# Patient Record
Sex: Male | Born: 1975 | Marital: Married | Smoking: Never smoker
Health system: Southern US, Community
[De-identification: ages and names within clinical notes are randomized; demographics above are authoritative.]

## PROBLEM LIST (undated history)

## (undated) DIAGNOSIS — G4730 Sleep apnea, unspecified: Secondary | ICD-10-CM

## (undated) DIAGNOSIS — J302 Other seasonal allergic rhinitis: Secondary | ICD-10-CM

## (undated) DIAGNOSIS — J189 Pneumonia, unspecified organism: Secondary | ICD-10-CM

## (undated) DIAGNOSIS — M51369 Other intervertebral disc degeneration, lumbar region without mention of lumbar back pain or lower extremity pain: Secondary | ICD-10-CM

## (undated) DIAGNOSIS — R42 Dizziness and giddiness: Secondary | ICD-10-CM

## (undated) DIAGNOSIS — L505 Cholinergic urticaria: Secondary | ICD-10-CM

## (undated) DIAGNOSIS — Z8679 Personal history of other diseases of the circulatory system: Secondary | ICD-10-CM

## (undated) DIAGNOSIS — T7840XA Allergy, unspecified, initial encounter: Secondary | ICD-10-CM

## (undated) DIAGNOSIS — M5136 Other intervertebral disc degeneration, lumbar region: Secondary | ICD-10-CM

## (undated) DIAGNOSIS — F32A Depression, unspecified: Secondary | ICD-10-CM

## (undated) DIAGNOSIS — Z9889 Other specified postprocedural states: Secondary | ICD-10-CM

## (undated) DIAGNOSIS — K219 Gastro-esophageal reflux disease without esophagitis: Secondary | ICD-10-CM

## (undated) DIAGNOSIS — J45909 Unspecified asthma, uncomplicated: Secondary | ICD-10-CM

## (undated) DIAGNOSIS — R040 Epistaxis: Secondary | ICD-10-CM

## (undated) DIAGNOSIS — T8859XA Other complications of anesthesia, initial encounter: Secondary | ICD-10-CM

## (undated) DIAGNOSIS — R112 Nausea with vomiting, unspecified: Secondary | ICD-10-CM

## (undated) DIAGNOSIS — I10 Essential (primary) hypertension: Secondary | ICD-10-CM

## (undated) DIAGNOSIS — M5126 Other intervertebral disc displacement, lumbar region: Secondary | ICD-10-CM

## (undated) DIAGNOSIS — Z8739 Personal history of other diseases of the musculoskeletal system and connective tissue: Secondary | ICD-10-CM

## (undated) HISTORY — PX: WISDOM TOOTH EXTRACTION: SHX21

## (undated) HISTORY — PX: ANTERIOR CRUCIATE LIGAMENT REPAIR: SHX115

## (undated) HISTORY — PX: COLON SURGERY: SHX602

## (undated) HISTORY — PX: SHOULDER SURGERY: SHX246

## (undated) HISTORY — DX: Cholinergic urticaria: L50.5

## (undated) HISTORY — DX: Allergy, unspecified, initial encounter: T78.40XA

## (undated) HISTORY — DX: Depression, unspecified: F32.A

## (undated) HISTORY — PX: MEDIAL COLLATERAL LIGAMENT AND LATERAL COLLATERAL LIGAMENT REPAIR, KNEE: SHX2017

## (undated) HISTORY — DX: Other intervertebral disc degeneration, lumbar region: M51.36

## (undated) HISTORY — DX: Other intervertebral disc displacement, lumbar region: M51.26

## (undated) HISTORY — DX: Other intervertebral disc degeneration, lumbar region without mention of lumbar back pain or lower extremity pain: M51.369

## (undated) HISTORY — DX: Personal history of other diseases of the musculoskeletal system and connective tissue: Z87.39

## (undated) HISTORY — DX: Personal history of other diseases of the circulatory system: Z86.79

## (undated) HISTORY — DX: Essential (primary) hypertension: I10

## (undated) HISTORY — DX: Epistaxis: R04.0

## (undated) HISTORY — DX: Pneumonia, unspecified organism: J18.9

---

## 1998-05-18 DIAGNOSIS — Z8739 Personal history of other diseases of the musculoskeletal system and connective tissue: Secondary | ICD-10-CM

## 1998-05-18 HISTORY — DX: Personal history of other diseases of the musculoskeletal system and connective tissue: Z87.39

## 2011-06-03 ENCOUNTER — Ambulatory Visit: Payer: Self-pay

## 2014-03-08 ENCOUNTER — Other Ambulatory Visit: Payer: Self-pay | Admitting: *Deleted

## 2014-03-08 ENCOUNTER — Encounter (INDEPENDENT_AMBULATORY_CARE_PROVIDER_SITE_OTHER): Payer: BC Managed Care – PPO

## 2014-03-08 ENCOUNTER — Encounter: Payer: Self-pay | Admitting: *Deleted

## 2014-03-08 DIAGNOSIS — R002 Palpitations: Secondary | ICD-10-CM

## 2014-03-08 NOTE — Progress Notes (Signed)
Patient ID: John Huang, male   DOB: 12/13/1975, 38 y.o.   MRN: [id number] [employer] 24 hour holter monitor applied to patient.

## 2014-07-16 ENCOUNTER — Ambulatory Visit
Admission: RE | Admit: 2014-07-16 | Discharge: 2014-07-16 | Disposition: A | Discharge status: Home / Self Care | Payer: BLUE CROSS/BLUE SHIELD | Source: Ambulatory Visit | Attending: Internal Medicine | Admitting: Internal Medicine

## 2014-07-16 ENCOUNTER — Other Ambulatory Visit: Payer: Self-pay | Admitting: Internal Medicine

## 2014-07-16 DIAGNOSIS — S6392XA Sprain of unspecified part of left wrist and hand, initial encounter: Secondary | ICD-10-CM

## 2014-07-16 IMAGING — CR DG HAND COMPLETE 3+V*L*
3 series · 3 of 3 positions shown · non-contrast
Comparison: None.

CLINICAL DATA: Ski jumping injury with left hand pain an swelling,
initial encounter

EXAM:
LEFT HAND - COMPLETE 3+ VIEW

[x hand pa left]
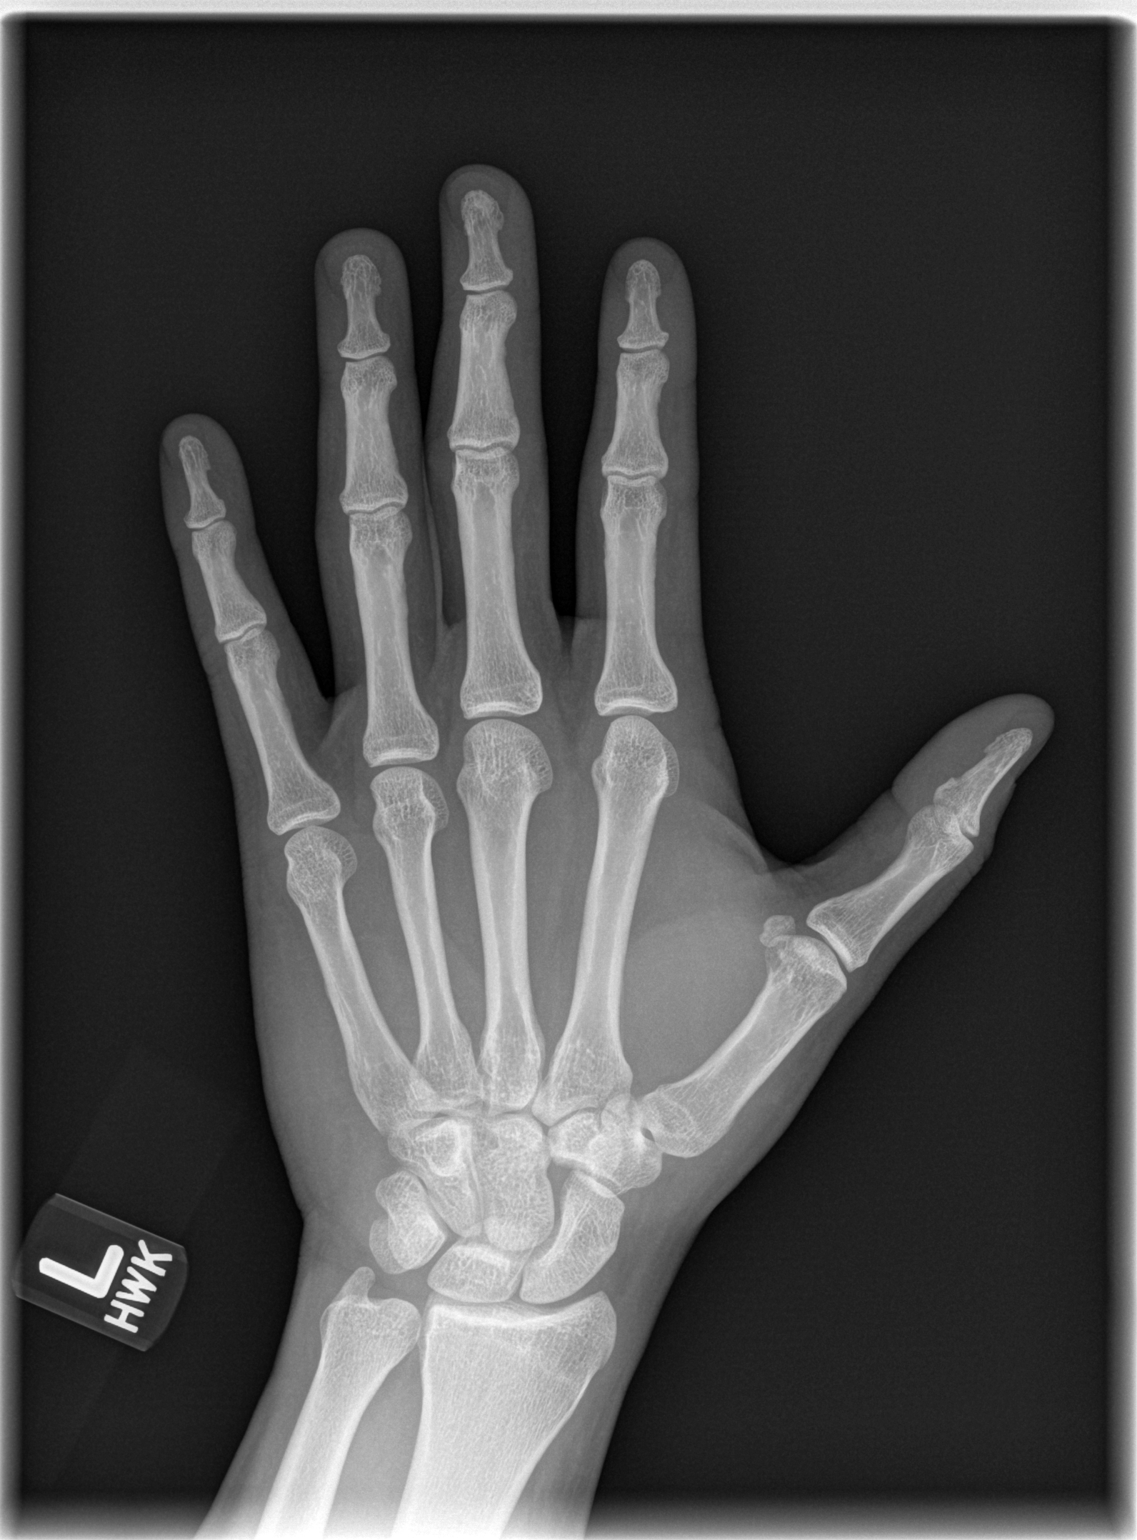

[x hand oblique left]
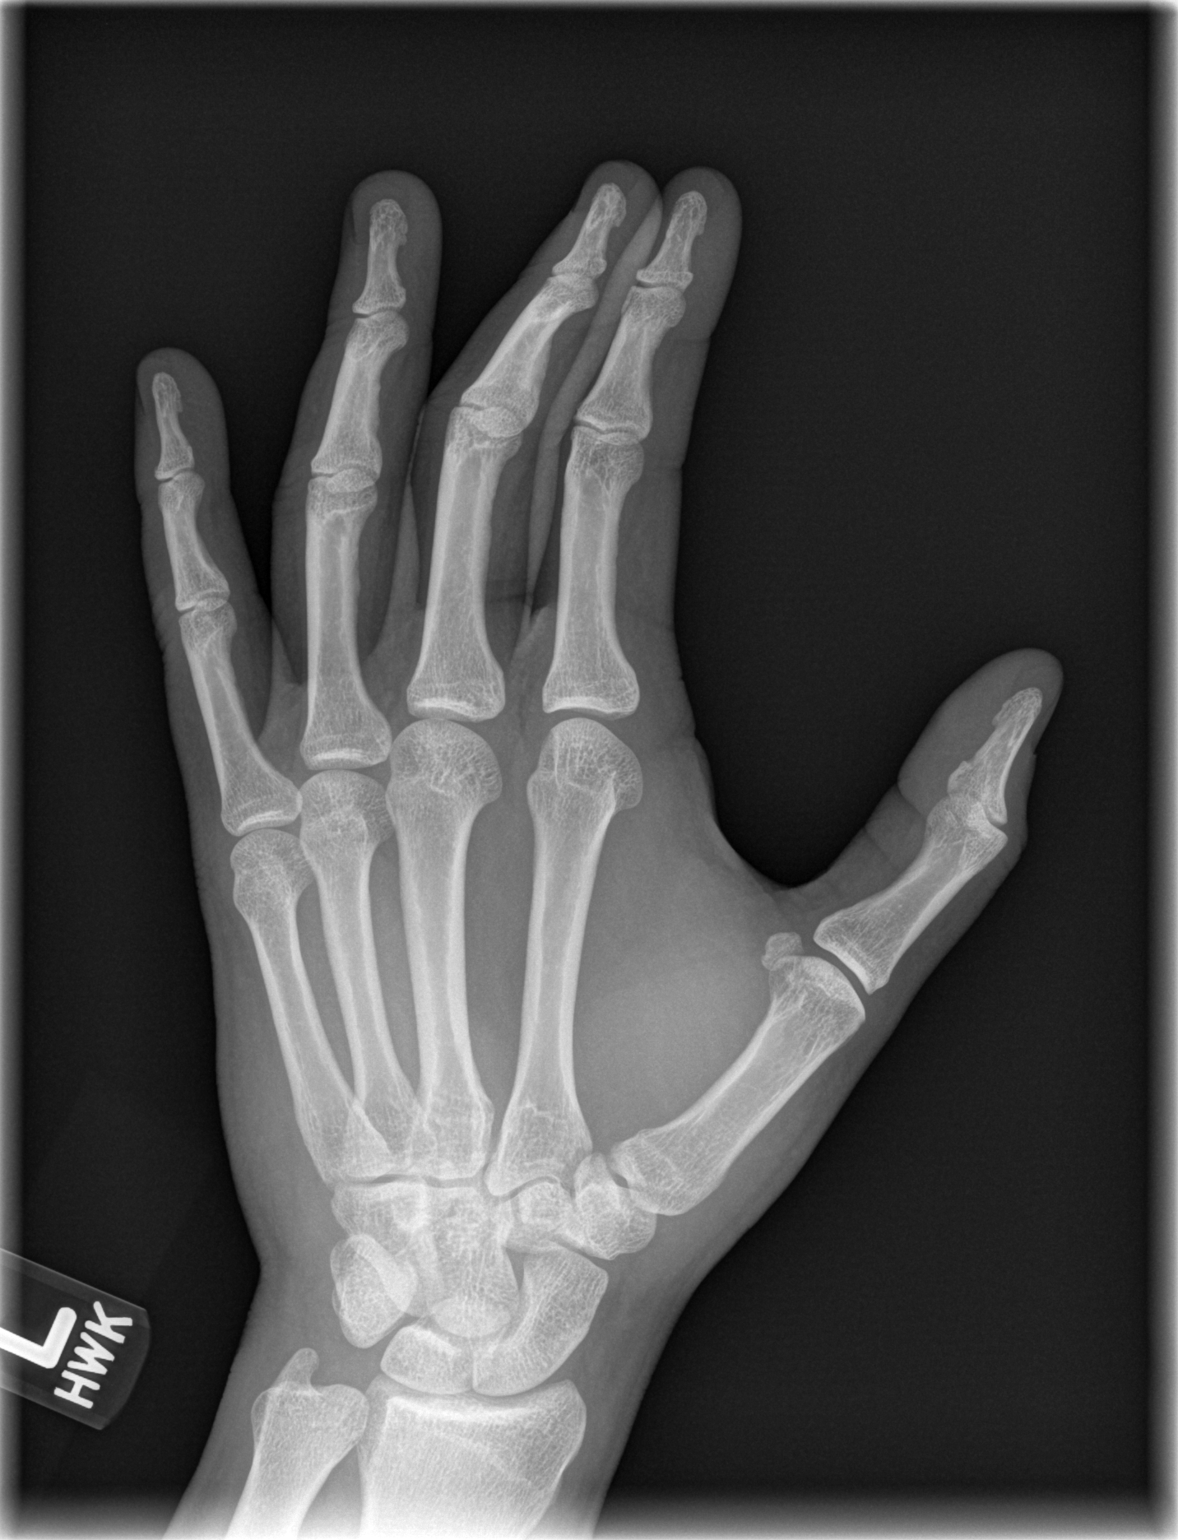

[x hand lat left]
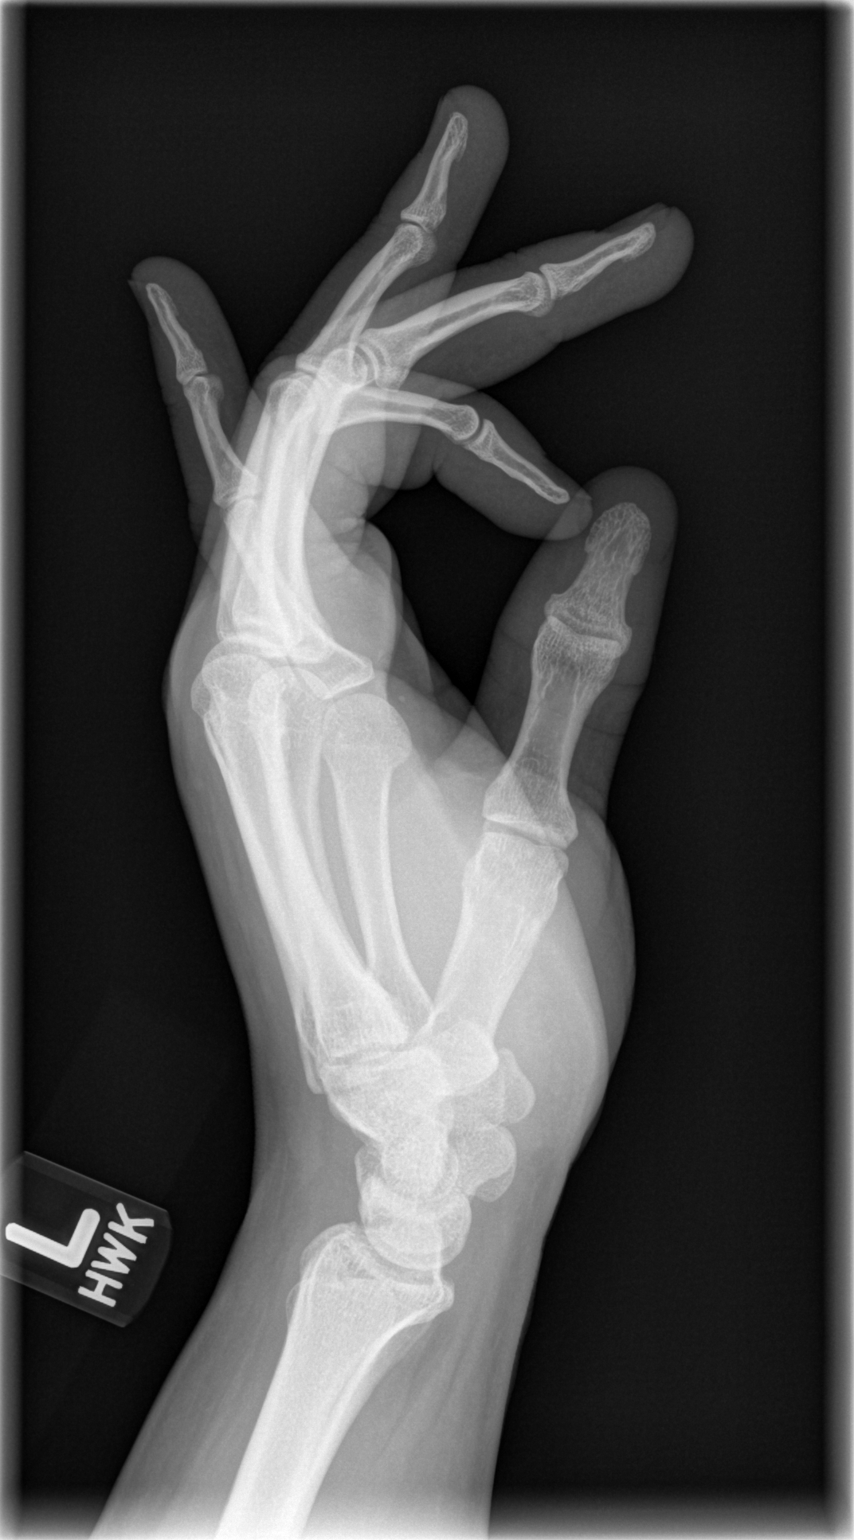

[3 of 3 positions shown; findings below may reference images not displayed]

FINDINGS: There is no evidence of fracture or dislocation. There is no
evidence of arthropathy or other focal bone abnormality. Soft
tissues are unremarkable.
IMPRESSION: No acute abnormality noted.

## 2016-02-18 DIAGNOSIS — J189 Pneumonia, unspecified organism: Secondary | ICD-10-CM | POA: Insufficient documentation

## 2016-02-18 DIAGNOSIS — K219 Gastro-esophageal reflux disease without esophagitis: Secondary | ICD-10-CM | POA: Insufficient documentation

## 2016-02-18 HISTORY — DX: Pneumonia, unspecified organism: J18.9

## 2016-05-22 LAB — HIV ANTIBODY (ROUTINE TESTING W REFLEX): HIV 1&2 Ab, 4th Generation: NEGATIVE

## 2017-05-12 DIAGNOSIS — Z7951 Long term (current) use of inhaled steroids: Secondary | ICD-10-CM | POA: Diagnosis not present

## 2017-05-12 DIAGNOSIS — I10 Essential (primary) hypertension: Secondary | ICD-10-CM | POA: Diagnosis not present

## 2017-05-12 DIAGNOSIS — M5126 Other intervertebral disc displacement, lumbar region: Secondary | ICD-10-CM | POA: Diagnosis not present

## 2017-05-12 DIAGNOSIS — R531 Weakness: Secondary | ICD-10-CM | POA: Diagnosis not present

## 2017-05-12 DIAGNOSIS — Z79899 Other long term (current) drug therapy: Secondary | ICD-10-CM | POA: Diagnosis not present

## 2017-05-12 DIAGNOSIS — M545 Low back pain: Secondary | ICD-10-CM | POA: Diagnosis not present

## 2017-05-12 DIAGNOSIS — M5416 Radiculopathy, lumbar region: Secondary | ICD-10-CM | POA: Diagnosis not present

## 2017-05-12 DIAGNOSIS — J45909 Unspecified asthma, uncomplicated: Secondary | ICD-10-CM | POA: Diagnosis not present

## 2017-05-12 DIAGNOSIS — Z7982 Long term (current) use of aspirin: Secondary | ICD-10-CM | POA: Diagnosis not present

## 2017-05-12 DIAGNOSIS — G4730 Sleep apnea, unspecified: Secondary | ICD-10-CM | POA: Diagnosis not present

## 2017-05-12 DIAGNOSIS — M549 Dorsalgia, unspecified: Secondary | ICD-10-CM | POA: Diagnosis not present

## 2017-05-14 DIAGNOSIS — M5126 Other intervertebral disc displacement, lumbar region: Secondary | ICD-10-CM | POA: Diagnosis not present

## 2017-05-20 DIAGNOSIS — M5126 Other intervertebral disc displacement, lumbar region: Secondary | ICD-10-CM | POA: Diagnosis not present

## 2017-05-20 DIAGNOSIS — M5416 Radiculopathy, lumbar region: Secondary | ICD-10-CM | POA: Diagnosis not present

## 2017-05-24 DIAGNOSIS — J342 Deviated nasal septum: Secondary | ICD-10-CM | POA: Insufficient documentation

## 2017-05-24 DIAGNOSIS — R040 Epistaxis: Secondary | ICD-10-CM

## 2017-05-24 DIAGNOSIS — J019 Acute sinusitis, unspecified: Secondary | ICD-10-CM | POA: Insufficient documentation

## 2017-05-24 DIAGNOSIS — J3489 Other specified disorders of nose and nasal sinuses: Secondary | ICD-10-CM | POA: Insufficient documentation

## 2017-05-24 HISTORY — DX: Epistaxis: R04.0

## 2017-05-25 DIAGNOSIS — L648 Other androgenic alopecia: Secondary | ICD-10-CM | POA: Diagnosis not present

## 2017-05-25 DIAGNOSIS — L505 Cholinergic urticaria: Secondary | ICD-10-CM | POA: Diagnosis not present

## 2017-05-25 DIAGNOSIS — B078 Other viral warts: Secondary | ICD-10-CM | POA: Diagnosis not present

## 2017-05-28 DIAGNOSIS — M5126 Other intervertebral disc displacement, lumbar region: Secondary | ICD-10-CM | POA: Diagnosis not present

## 2017-05-28 DIAGNOSIS — M5442 Lumbago with sciatica, left side: Secondary | ICD-10-CM | POA: Diagnosis not present

## 2017-05-28 DIAGNOSIS — M5441 Lumbago with sciatica, right side: Secondary | ICD-10-CM | POA: Diagnosis not present

## 2017-06-23 DIAGNOSIS — M5126 Other intervertebral disc displacement, lumbar region: Secondary | ICD-10-CM | POA: Diagnosis not present

## 2017-06-23 DIAGNOSIS — M5416 Radiculopathy, lumbar region: Secondary | ICD-10-CM | POA: Diagnosis not present

## 2017-08-05 DIAGNOSIS — M5416 Radiculopathy, lumbar region: Secondary | ICD-10-CM | POA: Diagnosis not present

## 2017-08-05 DIAGNOSIS — M5126 Other intervertebral disc displacement, lumbar region: Secondary | ICD-10-CM | POA: Diagnosis not present

## 2017-08-16 DIAGNOSIS — M5416 Radiculopathy, lumbar region: Secondary | ICD-10-CM | POA: Diagnosis not present

## 2017-08-16 DIAGNOSIS — M5126 Other intervertebral disc displacement, lumbar region: Secondary | ICD-10-CM | POA: Diagnosis not present

## 2017-08-23 DIAGNOSIS — R6889 Other general symptoms and signs: Secondary | ICD-10-CM | POA: Diagnosis not present

## 2017-08-23 DIAGNOSIS — R3915 Urgency of urination: Secondary | ICD-10-CM | POA: Diagnosis not present

## 2017-09-29 ENCOUNTER — Ambulatory Visit: Payer: Self-pay | Admitting: Family Medicine

## 2017-10-18 ENCOUNTER — Ambulatory Visit: Payer: Self-pay | Admitting: Family Medicine

## 2017-10-21 ENCOUNTER — Encounter: Payer: Self-pay | Admitting: Family Medicine

## 2017-10-21 ENCOUNTER — Ambulatory Visit (INDEPENDENT_AMBULATORY_CARE_PROVIDER_SITE_OTHER): Payer: 59 | Admitting: Family Medicine

## 2017-10-21 VITALS — BP 134/64 | HR 71 | Temp 98.6°F | Ht 74.0 in | Wt 250.5 lb

## 2017-10-21 DIAGNOSIS — Z13220 Encounter for screening for lipoid disorders: Secondary | ICD-10-CM | POA: Diagnosis not present

## 2017-10-21 DIAGNOSIS — G8929 Other chronic pain: Secondary | ICD-10-CM | POA: Diagnosis not present

## 2017-10-21 DIAGNOSIS — I10 Essential (primary) hypertension: Secondary | ICD-10-CM

## 2017-10-21 DIAGNOSIS — E7521 Fabry (-Anderson) disease: Secondary | ICD-10-CM | POA: Diagnosis not present

## 2017-10-21 DIAGNOSIS — M25562 Pain in left knee: Secondary | ICD-10-CM

## 2017-10-21 DIAGNOSIS — M5126 Other intervertebral disc displacement, lumbar region: Secondary | ICD-10-CM

## 2017-10-21 DIAGNOSIS — L505 Cholinergic urticaria: Secondary | ICD-10-CM | POA: Insufficient documentation

## 2017-10-21 DIAGNOSIS — M5416 Radiculopathy, lumbar region: Secondary | ICD-10-CM | POA: Diagnosis not present

## 2017-10-21 DIAGNOSIS — M51369 Other intervertebral disc degeneration, lumbar region without mention of lumbar back pain or lower extremity pain: Secondary | ICD-10-CM | POA: Insufficient documentation

## 2017-10-21 DIAGNOSIS — M5136 Other intervertebral disc degeneration, lumbar region: Secondary | ICD-10-CM

## 2017-10-21 LAB — UA/M W/RFLX CULTURE, ROUTINE
Bilirubin, UA: NEGATIVE
GLUCOSE, UA: NEGATIVE
Ketones, UA: NEGATIVE
LEUKOCYTES UA: NEGATIVE
Nitrite, UA: NEGATIVE
PROTEIN UA: NEGATIVE
RBC, UA: NEGATIVE
Specific Gravity, UA: 1.015 (ref 1.005–1.030)
Urobilinogen, Ur: 0.2 mg/dL (ref 0.2–1.0)
pH, UA: 7.5 (ref 5.0–7.5)

## 2017-10-21 LAB — MICROALBUMIN, URINE WAIVED
CREATININE, URINE WAIVED: 200 mg/dL (ref 10–300)
MICROALB, UR WAIVED: 10 mg/L (ref 0–19)
Microalb/Creat Ratio: <30 mg/g (ref <30)

## 2017-10-21 MED ORDER — CITALOPRAM HYDROBROMIDE 20 MG PO TABS: 20.0000 mg | ORAL_TABLET | Freq: Every day | ORAL | 1 refills | Status: DC

## 2017-10-21 MED ORDER — FEXOFENADINE HCL 180 MG PO TABS: 180.0000 mg | ORAL_TABLET | Freq: Every day | ORAL | 3 refills | Status: DC

## 2017-10-21 MED ORDER — RANITIDINE HCL 150 MG PO TABS: 150.0000 mg | ORAL_TABLET | Freq: Two times a day (BID) | ORAL | 3 refills | Status: DC

## 2017-10-21 MED ORDER — AMLODIPINE BESYLATE 5 MG PO TABS: 5.0000 mg | ORAL_TABLET | Freq: Every day | ORAL | 1 refills | Status: DC

## 2017-10-21 NOTE — Assessment & Plan Note (Signed)
Will try adding zantac and benadryl. Refills given. Refill of citalopram given. Call with any concerns.

## 2017-10-21 NOTE — Assessment & Plan Note (Signed)
Continue avoiding meat. Call with any concerns. Continue to monitor.

## 2017-10-21 NOTE — Progress Notes (Signed)
BP 134/64 (BP Location: Right Arm, Cuff Size: Normal)   Pulse 71   Temp 98.6 F (37 C)   Ht 6\' 2"  (1.88 m)   Wt 250 lb 8 oz (113.6 kg)   SpO2 99%   BMI 32.16 kg/m    Subjective:    Patient ID: John Huang, male    DOB: [birth year], 42 y.o.   MRN: [id number]  HPI: DERMOT GREMILLION is a 42 y.o. male who presents today to establish care. Has been seeing [facility] up until very recently.  Chief Complaint  Patient presents with  . Hypertension  . Urticaria  . 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 He notes that in the winter, he would have itching on the palms of his hands with the cold. Then would have itching with showering. Was really watching what he put on his skin and and ate. It didn't seem to go away. Continued to have hives. He was started on citalopram and gabapentin. He was started on a bunch of antihistamines. Has seen allergist and was on cyclosporin. Has taken himself off it. Now just taking 2-5 allegras a daily. Has been working on trying to decrease this. Had ACL repair of his L knee in the past. He is not sure what is going on with his knee- and is not sure if it's breaking down and causing him to be feeling worse.   Has been having some issues with his back. Has been getting dry needling and has been following with spine center.  HYPERTENSION Hypertension status: stable  Satisfied with current treatment? yes Duration of hypertension: chronic BP monitoring frequency:  not checking BP medication side effects:  no Medication compliance: excellent compliance Previous BP meds: amlodipine Aspirin: no Recurrent headaches: no Visual changes: no Palpitations: no Dyspnea: no Chest pain: no Lower extremity edema: no Dizzy/lightheaded: no    Depression screen [facility] 2/9 10/21/2017  Decreased Interest 0  Down, Depressed, Hopeless 0  PHQ - 2 Score 0  Altered sleeping 0  Tired, decreased energy 0  Change in appetite 3  Feeling  bad or failure about yourself  0  Trouble concentrating 0  Moving slowly or fidgety/restless 0  Suicidal thoughts 0  PHQ-9 Score 3  Difficult doing work/chores Not difficult at all     Active Ambulatory Problems    Diagnosis Date Noted  . Hypertension   . Cholinergic urticaria   . Bulging lumbar disc   . 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 No Resolved Ambulatory Problems   Past Medical History:  Diagnosis Date  . Bulging lumbar disc   . Cholinergic urticaria   . History of endocarditis   . History of septic arthritis 2000  . Hypertension    Past Surgical History:  Procedure Laterality Date  . ANTERIOR CRUCIATE LIGAMENT REPAIR    . MEDIAL COLLATERAL LIGAMENT AND LATERAL COLLATERAL LIGAMENT REPAIR, KNEE    . SHOULDER SURGERY     dislocation and tac left in there, so had to have surgery to removed the tac  . WISDOM TOOTH EXTRACTION      Outpatient Encounter Medications as of 10/21/2017  Medication Sig  . amLODipine (NORVASC) 5 MG tablet Take 1 tablet (5 mg total) by mouth daily.  . citalopram (CELEXA) 20 MG tablet Take 1 tablet (20 mg total) by mouth daily.  . fexofenadine (ALLEGRA) 180 MG tablet Take 1-2  tablets (180-360 mg total) by mouth daily.  . fluticasone (FLONASE) 50 MCG/ACT nasal spray 2 sprays by Each Nare route daily.  Marland Kitchen gabapentin (NEURONTIN) 300 MG capsule Take 600 mg by mouth 3 (three) times daily.   Marland Kitchen ibuprofen (ADVIL,MOTRIN) 800 MG tablet Take by mouth.  . meloxicam (MOBIC) 15 MG tablet Take by mouth.  . [DISCONTINUED] amLODipine (NORVASC) 5 MG tablet Take by mouth daily.   . [DISCONTINUED] citalopram (CELEXA) 20 MG tablet Take by mouth.  . [DISCONTINUED] fexofenadine (ALLEGRA) 180 MG tablet Take by mouth daily.   . Multiple Vitamin (MULTI-VITAMINS) TABS Take by mouth.  . ranitidine (ZANTAC) 150 MG tablet Take 1 tablet (150 mg total) by mouth 2 (two) times daily.   No  facility-administered encounter medications on file as of 10/21/2017.    Allergies  Allergen Reactions  . Cat Hair Extract Hives and Other (See Comments)    Wheezing, eyes itch  . Galactose Other (See Comments)    Flu like symptoms after consuming mammalian products   Social History   Socioeconomic History  . Marital status: Married    Spouse name: Not on file  . Number of children: Not on file  . Years of education: Not on file  . Highest education level: Not on file  Occupational History  . Not on file  Social Needs  . Financial resource strain: Not on file  . Food insecurity:    Worry: Not on file    Inability: Not on file  . Transportation needs:    Medical: Not on file    Non-medical: Not on file  Tobacco Use  . Smoking status: Never Smoker  . Smokeless tobacco: Never Used  Substance and Sexual Activity  . Alcohol use: Yes    Comment: occasion  . Drug use: Never  . Sexual activity: Yes    Birth control/protection: None  Lifestyle  . Physical activity:    Days per week: Not on file    Minutes per session: Not on file  . Stress: Not on file  Relationships  . Social connections:    Talks on phone: Not on file    Gets together: Not on file    Attends religious service: Not on file    Active member of club or organization: Not on file    Attends meetings of clubs or organizations: Not on file    Relationship status: Not on file  . Intimate partner violence:    Fear of current or ex partner: Not on file    Emotionally abused: Not on file    Physically abused: Not on file    Forced sexual activity: Not on file  Other Topics Concern  . Not on file  Social History Narrative  . Not on file   Family History  Problem Relation Age of Onset  . Stroke Mother   . Hypertension Mother   . Diabetes Mother   . Cancer Father        Throat  . Cancer Sister        Eye  . Hypertension Brother   . Irritable bowel syndrome Brother     Review of Systems    Constitutional: Negative.   Respiratory: Negative.   Cardiovascular: Negative.   Musculoskeletal: Positive for back pain and myalgias. Negative for arthralgias, gait problem, joint swelling, neck pain and neck stiffness.  Skin: Positive for rash. Negative for color change, pallor and wound.  Neurological: Negative.   Psychiatric/Behavioral: Negative.     Per  HPI unless specifically indicated above     Objective:    BP 134/64 (BP Location: Right Arm, Cuff Size: Normal)   Pulse 71   Temp 98.6 F (37 C)   Ht 6\' 2"  (1.88 m)   Wt 250 lb 8 oz (113.6 kg)   SpO2 99%   BMI 32.16 kg/m   Wt Readings from Last 3 Encounters:  10/21/17 250 lb 8 oz (113.6 kg)    Physical Exam  Constitutional: He is oriented to person, place, and time. He appears well-developed and well-nourished. No distress.  HENT:  Head: Normocephalic and atraumatic.  Right Ear: Hearing normal.  Left Ear: Hearing normal.  Nose: Nose normal.  Eyes: Conjunctivae and lids are normal. Right eye exhibits no discharge. Left eye exhibits no discharge. No scleral icterus.  Cardiovascular: Normal rate, regular rhythm, normal heart sounds and intact distal pulses. Exam reveals no gallop and no friction rub.  No murmur heard. Pulmonary/Chest: Effort normal and breath sounds normal. No stridor. No respiratory distress. He has no wheezes. He has no rales. He exhibits no tenderness.  Musculoskeletal: Normal range of motion.  Neurological: He is alert and oriented to person, place, and time.  Skin: Skin is warm, dry and intact. Capillary refill takes less than 2 seconds. No rash noted. He is not diaphoretic. No erythema. No pallor.  Psychiatric: He has a normal mood and affect. His speech is normal and behavior is normal. Judgment and thought content normal. Cognition and memory are normal.  Nursing note and vitals reviewed.   Results for orders placed or performed in visit on 10/21/17  HIV antibody  Result Value Ref Range   HIV  1&2 Ab, 4th Generation negative       Assessment & Plan:   Problem List Items Addressed This Visit      Cardiovascular and Mediastinum   Hypertension - Primary    Under good control on recheck. Continue current regimen. Continue to monitor. Call with any concerns.       Relevant Medications   amLODipine (NORVASC) 5 MG tablet   Other Relevant Orders   CBC with Differential/Platelet   Comprehensive metabolic panel   Microalbumin, Urine Waived   TSH   UA/M w/rflx Culture, Routine     Musculoskeletal and Integument   Cholinergic urticaria    Will try adding zantac and benadryl. Refills given. Refill of citalopram given. Call with any concerns.       Bulging lumbar disc    Continue gabapentin and meloxicam. Call with any concerns. Continue to monitor.       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 Call with any concerns. Continue to monitor.        Other Visit Diagnoses    Screening for cholesterol level       Labs drawn today. Await results. Call with any concerns.    Relevant Orders   Lipid Panel w/o Chol/HDL Ratio   Chronic pain of left knee       Concern that there is metal in his knee. Would like to check. X-ray ordered today. Await results.    Relevant Medications   gabapentin (NEURONTIN) 300 MG capsule   ibuprofen (ADVIL,MOTRIN) 800 MG tablet   meloxicam (MOBIC) 15 MG tablet   citalopram (CELEXA) 20 MG tablet   Other Relevant Orders   DG Knee Complete 4 Views Left       Follow up plan: Return Before July 1 , for Physical.

## 2017-10-21 NOTE — Assessment & Plan Note (Signed)
Under good control on recheck. Continue current regimen. Continue to monitor. Call with any concerns.  

## 2017-10-21 NOTE — Assessment & Plan Note (Signed)
Continue gabapentin and meloxicam. Call with any concerns. Continue to monitor.

## 2017-10-21 NOTE — Patient Instructions (Signed)
The Mindfulness App.  ?Headspace.  ?Calm.  ?MINDBODY.  ?Buddhify.  ?Insight Timer.  ?Smiling Mind.  ?Meditation Timer Pro.  ?Aura  ? ?

## 2017-10-22 LAB — CBC WITH DIFFERENTIAL/PLATELET
Basophils Absolute: 0 10*3/uL (ref 0.0–0.2)
Basos: 0 %
EOS (ABSOLUTE): 0.1 10*3/uL (ref 0.0–0.4)
Eos: 3 %
HEMOGLOBIN: 14.3 g/dL (ref 13.0–17.7)
Hematocrit: 42.4 % (ref 37.5–51.0)
Immature Grans (Abs): 0 10*3/uL (ref 0.0–0.1)
Immature Granulocytes: 0 %
LYMPHS ABS: 2.2 10*3/uL (ref 0.7–3.1)
Lymphs: 44 %
MCH: 29.1 pg (ref 26.6–33.0)
MCHC: 33.7 g/dL (ref 31.5–35.7)
MCV: 86 fL (ref 79–97)
Monocytes Absolute: 0.5 10*3/uL (ref 0.1–0.9)
Monocytes: 10 %
Neutrophils Absolute: 2.1 10*3/uL (ref 1.4–7.0)
Neutrophils: 43 %
Platelets: 267 10*3/uL (ref 150–450)
RBC: 4.92 x10E6/uL (ref 4.14–5.80)
RDW: 16.3 % — ABNORMAL HIGH (ref 12.3–15.4)
WBC: 5 10*3/uL (ref 3.4–10.8)

## 2017-10-22 LAB — COMPREHENSIVE METABOLIC PANEL
ALBUMIN: 4.6 g/dL (ref 3.5–5.5)
ALT: 24 IU/L (ref 0–44)
AST: 18 IU/L (ref 0–40)
Albumin/Globulin Ratio: 1.6 (ref 1.2–2.2)
Alkaline Phosphatase: 58 IU/L (ref 39–117)
BUN / CREAT RATIO: 13 (ref 9–20)
BUN: 14 mg/dL (ref 6–24)
Bilirubin Total: <0.2 mg/dL (ref 0.0–1.2)
CALCIUM: 9.7 mg/dL (ref 8.7–10.2)
CO2: 25 mmol/L (ref 20–29)
CREATININE: 1.05 mg/dL (ref 0.76–1.27)
Chloride: 106 mmol/L (ref 96–106)
GFR, EST AFRICAN AMERICAN: 101 mL/min/{1.73_m2} (ref >59)
GFR, EST NON AFRICAN AMERICAN: 87 mL/min/{1.73_m2} (ref >59)
GLOBULIN, TOTAL: 2.9 g/dL (ref 1.5–4.5)
Glucose: 77 mg/dL (ref 65–99)
Potassium: 4.3 mmol/L (ref 3.5–5.2)
SODIUM: 144 mmol/L (ref 134–144)
TOTAL PROTEIN: 7.5 g/dL (ref 6.0–8.5)

## 2017-10-22 LAB — LIPID PANEL W/O CHOL/HDL RATIO
Cholesterol, Total: 149 mg/dL (ref 100–199)
HDL: 33 mg/dL — ABNORMAL LOW (ref >39)
LDL CALC: 75 mg/dL (ref 0–99)
Triglycerides: 206 mg/dL — ABNORMAL HIGH (ref 0–149)
VLDL Cholesterol Cal: 41 mg/dL — ABNORMAL HIGH (ref 5–40)

## 2017-10-22 LAB — TSH: TSH: 1.52 u[IU]/mL (ref 0.450–4.500)

## 2017-10-25 ENCOUNTER — Ambulatory Visit
Admission: RE | Admit: 2017-10-25 | Discharge: 2017-10-25 | Disposition: A | Discharge status: Home / Self Care | Payer: 59 | Source: Ambulatory Visit | Attending: Family Medicine | Admitting: Family Medicine

## 2017-10-25 DIAGNOSIS — G8929 Other chronic pain: Secondary | ICD-10-CM

## 2017-10-25 DIAGNOSIS — M25562 Pain in left knee: Principal | ICD-10-CM

## 2017-10-25 DIAGNOSIS — M25762 Osteophyte, left knee: Secondary | ICD-10-CM | POA: Insufficient documentation

## 2017-10-25 DIAGNOSIS — M9252 Juvenile osteochondrosis of tibia and fibula, left leg: Secondary | ICD-10-CM | POA: Diagnosis not present

## 2017-10-25 IMAGING — CR DG KNEE COMPLETE 4+V*L*
4 series · 4 of 4 positions shown · non-contrast
Comparison: None.

CLINICAL DATA: Left knee pain inferior to the patella. Itching
about the knee.

EXAM:
LEFT KNEE - COMPLETE 4+ VIEW

[knee ap]
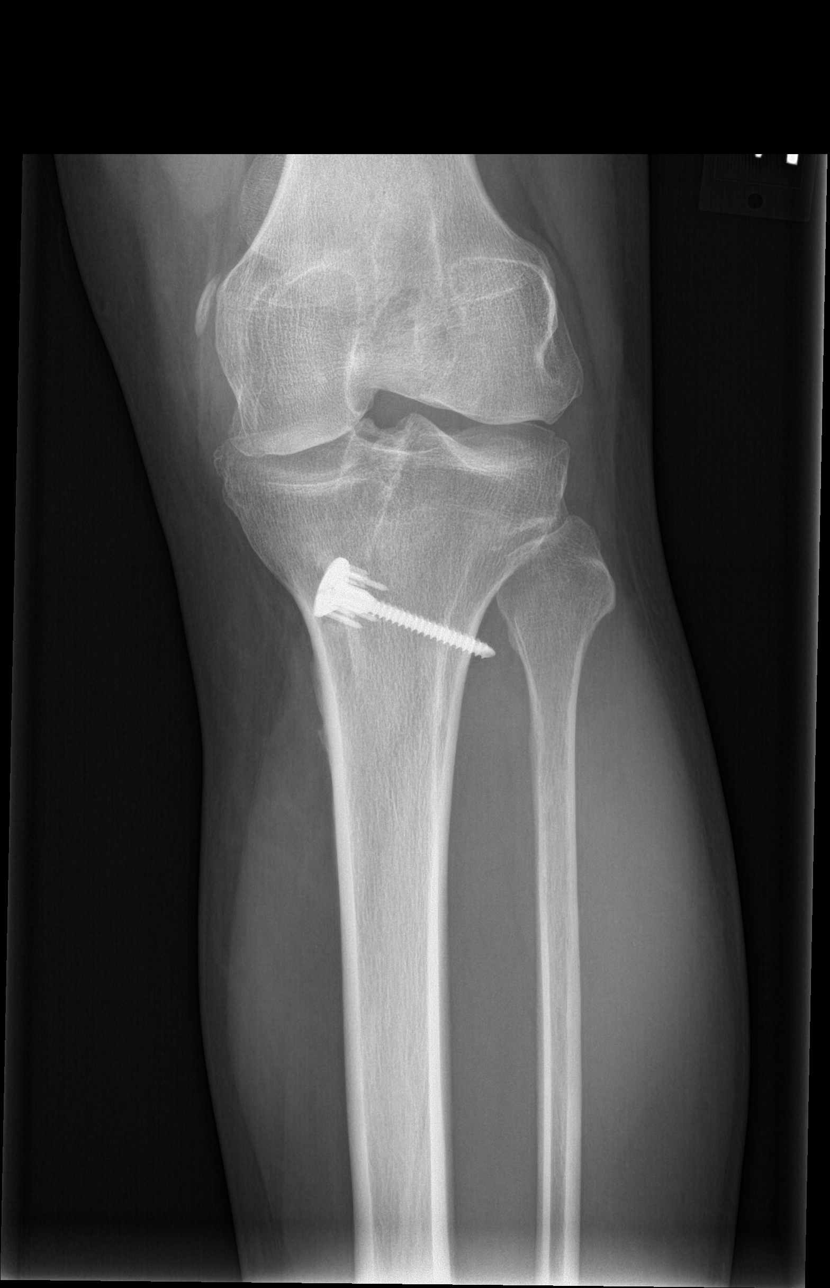

[knee lat]
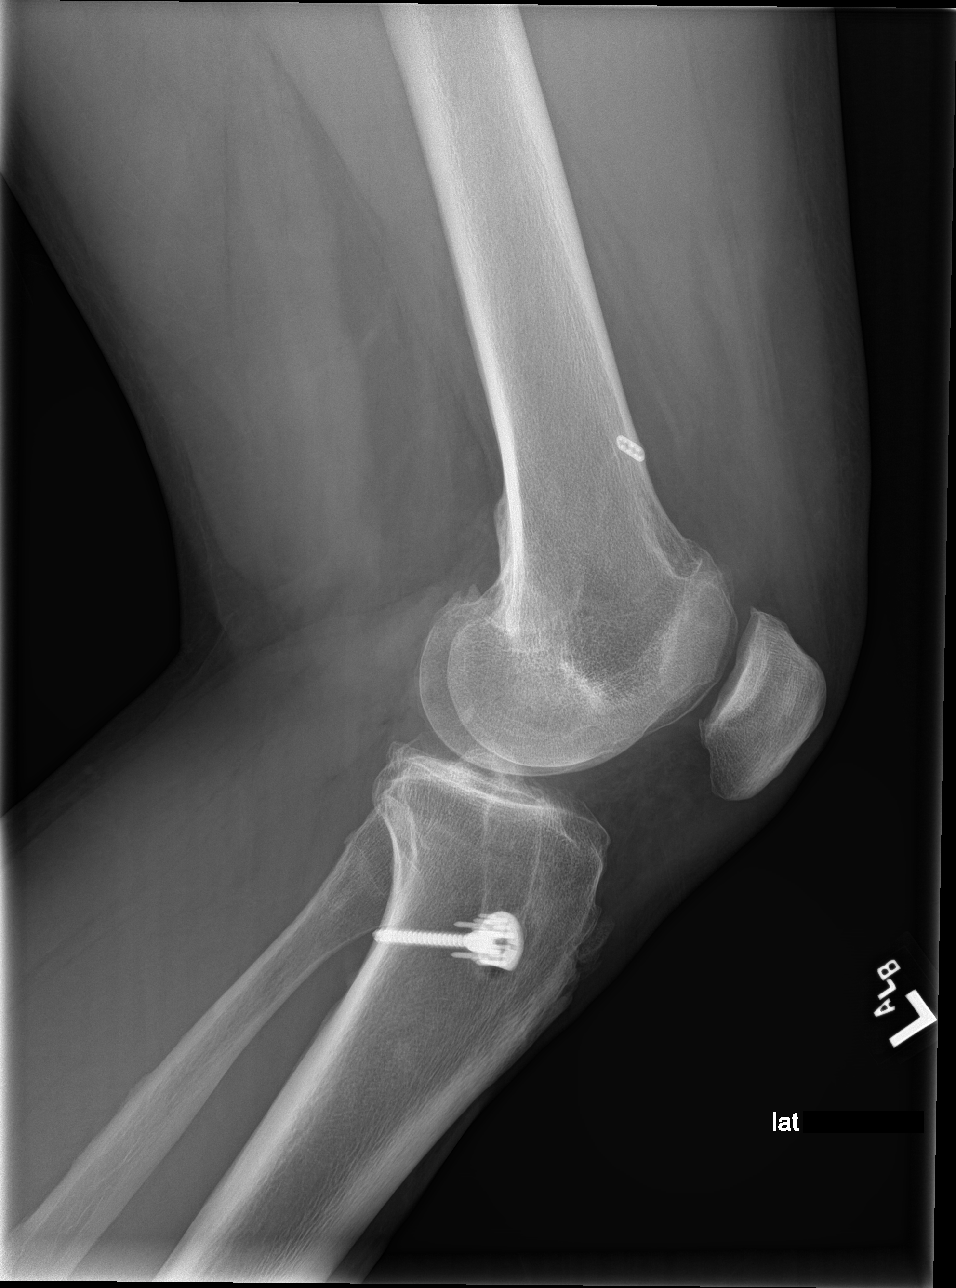

[tunnel]
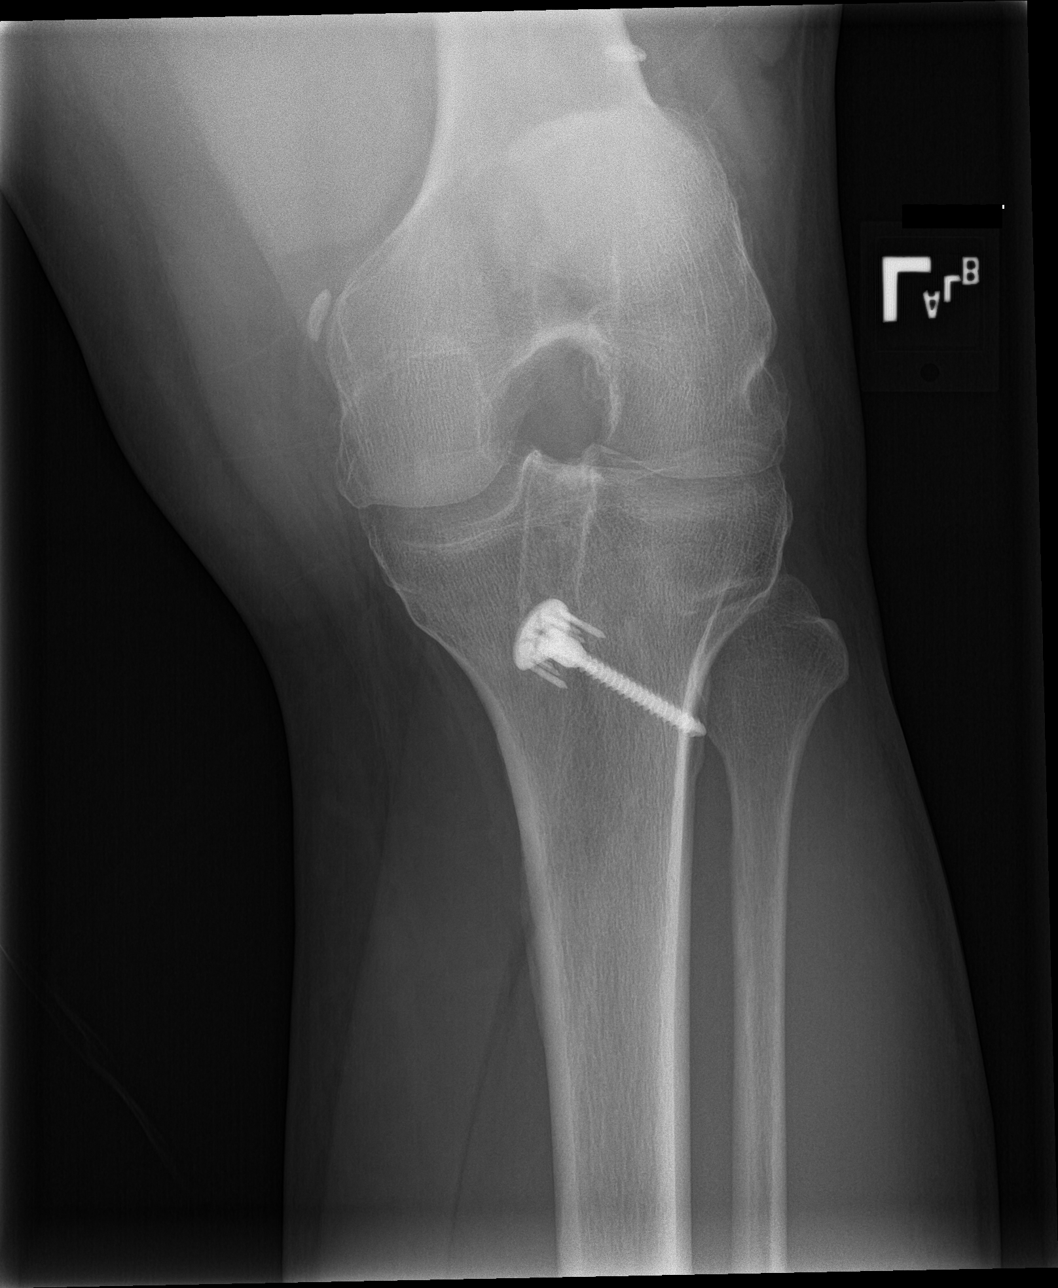

[patella skyline]
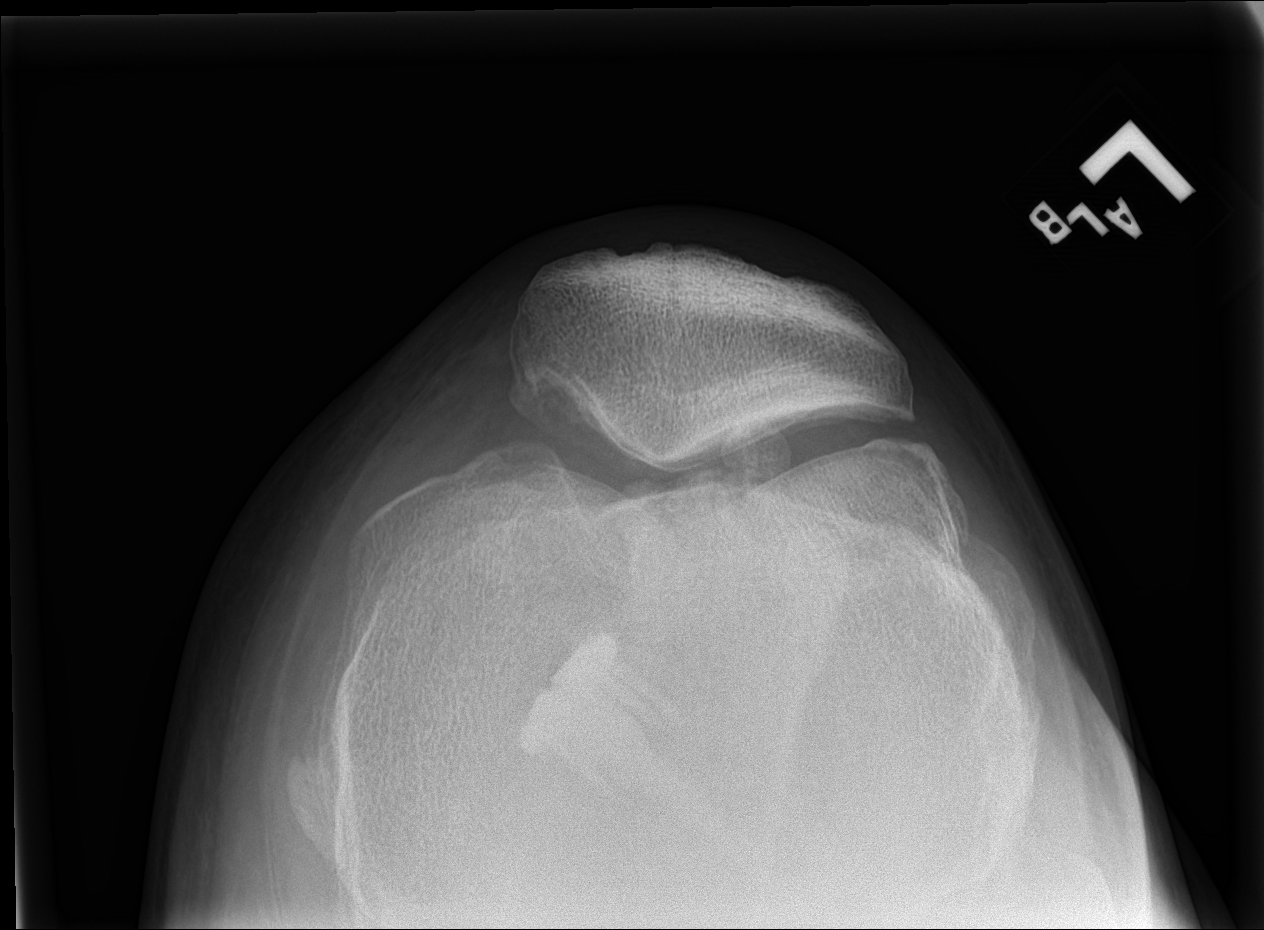

[4 of 4 positions shown; findings below may reference images not displayed]

FINDINGS: No acute bony or joint abnormality is identified. The patient is
status post ACL grafting. Osteophytosis is present about all 3
compartments of the knee although joint spaces are preserved.
Fragmentation of the tibial tuberosity is consistent with
Osgood-Schlatter disease. Small ossification along the medial
epicondyle of the left femur is consistent with remote MCL injury.
No joint effusion. No soft tissue gas.
IMPRESSION: No acute abnormality.

Osteophytosis about the knee is advanced for age.

Status post ACL grafting.

Old Osgood-Schlatter disease.

## 2017-11-05 ENCOUNTER — Encounter: Payer: Self-pay | Admitting: Family Medicine

## 2017-11-05 ENCOUNTER — Ambulatory Visit (INDEPENDENT_AMBULATORY_CARE_PROVIDER_SITE_OTHER): Payer: 59 | Admitting: Family Medicine

## 2017-11-05 VITALS — BP 146/99 | HR 71 | Temp 98.6°F | Ht 74.0 in | Wt 251.2 lb

## 2017-11-05 DIAGNOSIS — Z0001 Encounter for general adult medical examination with abnormal findings: Secondary | ICD-10-CM

## 2017-11-05 DIAGNOSIS — M79675 Pain in left toe(s): Secondary | ICD-10-CM

## 2017-11-05 DIAGNOSIS — G8929 Other chronic pain: Secondary | ICD-10-CM | POA: Diagnosis not present

## 2017-11-05 DIAGNOSIS — K141 Geographic tongue: Secondary | ICD-10-CM

## 2017-11-05 DIAGNOSIS — Z0000 Encounter for general adult medical examination without abnormal findings: Secondary | ICD-10-CM

## 2017-11-05 NOTE — Patient Instructions (Signed)

## 2017-11-05 NOTE — Progress Notes (Signed)
BP (!) 146/99   Pulse 71   Temp 98.6 F (37 C) (Oral)   Ht 6\' 2"  (1.88 m)   Wt 251 lb 3.2 oz (113.9 kg)   SpO2 97%   BMI 32.25 kg/m    Subjective:    Patient ID: John Huang, male    DOB: 1975/08/18, 42 y.o.   MRN: [id number]  HPI: John Huang is a 42 y.o. male presenting on 11/05/2017 for comprehensive medical examination. Current medical complaints include: Has been having pain in his L toe for years. Seems hurt when he's walking without shoes. He would like to see a podiatrist about that.   He currently lives with: wife and kid Interim Problems from his last visit: no  Depression Screen done today and results listed below:  Depression screen PHQ 2/9 10/21/2017  Decreased Interest 0  Down, Depressed, Hopeless 0  PHQ - 2 Score 0  Altered sleeping 0  Tired, decreased energy 0  Change in appetite 3  Feeling bad or failure about yourself  0  Trouble concentrating 0  Moving slowly or fidgety/restless 0  Suicidal thoughts 0  PHQ-9 Score 3  Difficult doing work/chores Not difficult at all    Past Medical History:  Past Medical History:  Diagnosis Date  . Bulging lumbar disc   . Cholinergic urticaria   . 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 History of septic arthritis 2000   L knee  . Hypertension     Surgical History:  Past Surgical History:  Procedure Laterality Date  . ANTERIOR CRUCIATE LIGAMENT REPAIR    . MEDIAL COLLATERAL LIGAMENT AND LATERAL COLLATERAL LIGAMENT REPAIR, KNEE    . SHOULDER SURGERY     dislocation and tac left in there, so had to have surgery to removed the tac  . WISDOM TOOTH EXTRACTION      Medications:  Current Outpatient Medications on File Prior to Visit  Medication Sig  . amLODipine (NORVASC) 5 MG tablet Take 1 tablet (5 mg total) by mouth daily.  . citalopram (CELEXA) 20 MG tablet Take 1 tablet (20 mg total) by mouth daily.  . fexofenadine (ALLEGRA) 180 MG tablet Take 1-2 tablets (180-360 mg total) by mouth daily.  .  fluticasone (FLONASE) 50 MCG/ACT nasal spray 2 sprays by Each Nare route daily.  Marland Kitchen gabapentin (NEURONTIN) 300 MG capsule Take 600 mg by mouth 3 (three) times daily.   . Multiple Vitamin (MULTI-VITAMINS) TABS Take by mouth.  . ranitidine (ZANTAC) 150 MG tablet Take 1 tablet (150 mg total) by mouth 2 (two) times daily.  Marland Kitchen ibuprofen (ADVIL,MOTRIN) 800 MG tablet Take by mouth.   No current facility-administered medications on file prior to visit.     Allergies:  Allergies  Allergen Reactions  . Cat Hair Extract Hives and Other (See Comments)    Wheezing, eyes itch  . Galactose Other (See Comments)    Flu like symptoms after consuming mammalian products    Social History:  Social History   Socioeconomic History  . Marital status: Married    Spouse name: Not on file  . Number of children: Not on file  . Years of education: Not on file  . Highest education level: Not on file  Occupational History  . Not on file  Social Needs  . Financial resource strain: Not on file  . Food insecurity:    Worry: Not on file    Inability: Not on file  . Transportation needs:  Medical: Not on file    Non-medical: Not on file  Tobacco Use  . Smoking status: Never Smoker  . Smokeless tobacco: Never Used  Substance and Sexual Activity  . Alcohol use: Yes    Comment: occasion  . Drug use: Never  . Sexual activity: Yes    Birth control/protection: None  Lifestyle  . Physical activity:    Days per week: Not on file    Minutes per session: Not on file  . Stress: Not on file  Relationships  . Social connections:    Talks on phone: Not on file    Gets together: Not on file    Attends religious service: Not on file    Active member of club or organization: Not on file    Attends meetings of clubs or organizations: Not on file    Relationship status: Not on file  . Intimate partner violence:    Fear of current or ex partner: Not on file    Emotionally abused: Not on file    Physically  abused: Not on file    Forced sexual activity: Not on file  Other Topics Concern  . Not on file  Social History Narrative  . Not on file   Social History   Tobacco Use  Smoking Status Never Smoker  Smokeless Tobacco Never Used   Social History   Substance and Sexual Activity  Alcohol Use Yes   Comment: occasion    Family History:  Family History  Problem Relation Age of Onset  . Stroke Mother   . Hypertension Mother   . Diabetes Mother   . Cancer Father        Throat  . Cancer Sister        Eye/Brain  . Hypertension Brother   . Irritable bowel syndrome Brother     Past medical history, surgical history, medications, allergies, family history and social history reviewed with patient today and changes made to appropriate areas of the chart.   Review of Systems  Constitutional: Positive for diaphoresis. Negative for chills, fever, malaise/fatigue and weight loss.  HENT: Negative.   Eyes: Positive for discharge. Negative for blurred vision, double vision, photophobia, pain and redness.  Respiratory: Negative.   Cardiovascular: Negative.   Gastrointestinal: Positive for nausea and vomiting. Negative for abdominal pain, blood in stool, constipation, diarrhea, heartburn and melena.  Genitourinary: Negative.   Musculoskeletal: Positive for back pain, joint pain and myalgias. Negative for falls and neck pain.  Skin: Positive for itching. Negative for rash.  Neurological: Negative.   Endo/Heme/Allergies: Negative for environmental allergies and polydipsia. Does not bruise/bleed easily.  Psychiatric/Behavioral: Negative.     All other ROS negative except what is listed above and in the HPI.      Objective:    BP (!) 146/99   Pulse 71   Temp 98.6 F (37 C) (Oral)   Ht 6\' 2"  (1.88 m)   Wt 251 lb 3.2 oz (113.9 kg)   SpO2 97%   BMI 32.25 kg/m   Wt Readings from Last 3 Encounters:  11/05/17 251 lb 3.2 oz (113.9 kg)  10/21/17 250 lb 8 oz (113.6 kg)    Physical Exam   Constitutional: He is oriented to person, place, and time. He appears well-developed and well-nourished. No distress.  HENT:  Head: Normocephalic and atraumatic.  Right Ear: Hearing, tympanic membrane, external ear and ear canal normal.  Left Ear: Hearing, tympanic membrane, external ear and ear canal normal.  Nose: Nose normal.  Mouth/Throat: Uvula is midline, oropharynx is clear and moist and mucous membranes are normal. No oropharyngeal exudate.  Geographic tongue on the tip of his tongue  Eyes: Pupils are equal, round, and reactive to light. Conjunctivae, EOM and lids are normal. Right eye exhibits no discharge. Left eye exhibits no discharge. No scleral icterus.  Neck: Normal range of motion. Neck supple. No JVD present. No tracheal deviation present. No thyromegaly present.  Cardiovascular: Normal rate, regular rhythm, normal heart sounds and intact distal pulses. Exam reveals no gallop and no friction rub.  No murmur heard. Pulmonary/Chest: Effort normal and breath sounds normal. No stridor. No respiratory distress. He has no wheezes. He has no rales. He exhibits no tenderness.  Abdominal: Soft. Bowel sounds are normal. He exhibits no distension and no mass. There is no tenderness. There is no rebound and no guarding. No hernia. Hernia confirmed negative in the right inguinal area and confirmed negative in the left inguinal area.  Genitourinary: Testes normal and penis normal. Cremasteric reflex is present. Right testis shows no mass, no swelling and no tenderness. Right testis is descended. Cremasteric reflex is not absent on the right side. Left testis shows no mass, no swelling and no tenderness. Left testis is descended. Cremasteric reflex is not absent on the left side. Uncircumcised. No phimosis, paraphimosis, hypospadias, penile erythema or penile tenderness. No discharge found.  Musculoskeletal: Normal range of motion. He exhibits no edema, tenderness or deformity.  Lymphadenopathy:      He has no cervical adenopathy.  Neurological: He is alert and oriented to person, place, and time. He displays normal reflexes. No cranial nerve deficit or sensory deficit. He exhibits normal muscle tone. Coordination normal.  Skin: Skin is warm, dry and intact. Capillary refill takes less than 2 seconds. No rash noted. He is not diaphoretic. No erythema. No pallor.  Psychiatric: He has a normal mood and affect. His speech is normal and behavior is normal. Judgment and thought content normal. Cognition and memory are normal.  Nursing note and vitals reviewed.   Results for orders placed or performed in visit on 10/21/17  HIV antibody  Result Value Ref Range   HIV 1&2 Ab, 4th Generation negative   CBC with Differential/Platelet  Result Value Ref Range   WBC 5.0 3.4 - 10.8 x10E3/uL   RBC 4.92 4.14 - 5.80 x10E6/uL   Hemoglobin 14.3 13.0 - 17.7 g/dL   Hematocrit 42.4 37.5 - 51.0 %   MCV 86 79 - 97 fL   MCH 29.1 26.6 - 33.0 pg   MCHC 33.7 31.5 - 35.7 g/dL   RDW 16.3 (H) 12.3 - 15.4 %   Platelets 267 150 - 450 x10E3/uL   Neutrophils 43 Not Estab. %   Lymphs 44 Not Estab. %   Monocytes 10 Not Estab. %   Eos 3 Not Estab. %   Basos 0 Not Estab. %   Neutrophils Absolute 2.1 1.4 - 7.0 x10E3/uL   Lymphocytes Absolute 2.2 0.7 - 3.1 x10E3/uL   Monocytes Absolute 0.5 0.1 - 0.9 x10E3/uL   EOS (ABSOLUTE) 0.1 0.0 - 0.4 x10E3/uL   Basophils Absolute 0.0 0.0 - 0.2 x10E3/uL   Immature Granulocytes 0 Not Estab. %   Immature Grans (Abs) 0.0 0.0 - 0.1 x10E3/uL  Comprehensive metabolic panel  Result Value Ref Range   Glucose 77 65 - 99 mg/dL   BUN 14 6 - 24 mg/dL   Creatinine, Ser 1.05 0.76 - 1.27 mg/dL   GFR calc non Af Amer 87 >  59 mL/min/1.73   GFR calc Af Amer 101 >59 mL/min/1.73   BUN/Creatinine Ratio 13 9 - 20   Sodium 144 134 - 144 mmol/L   Potassium 4.3 3.5 - 5.2 mmol/L   Chloride 106 96 - 106 mmol/L   CO2 25 20 - 29 mmol/L   Calcium 9.7 8.7 - 10.2 mg/dL   Total Protein 7.5 6.0 - 8.5  g/dL   Albumin 4.6 3.5 - 5.5 g/dL   Globulin, Total 2.9 1.5 - 4.5 g/dL   Albumin/Globulin Ratio 1.6 1.2 - 2.2   Bilirubin Total <0.2 0.0 - 1.2 mg/dL   Alkaline Phosphatase 58 39 - 117 IU/L   AST 18 0 - 40 IU/L   ALT 24 0 - 44 IU/L  Lipid Panel w/o Chol/HDL Ratio  Result Value Ref Range   Cholesterol, Total 149 100 - 199 mg/dL   Triglycerides 206 (H) 0 - 149 mg/dL   HDL 33 (L) >39 mg/dL   VLDL Cholesterol Cal 41 (H) 5 - 40 mg/dL   LDL Calculated 75 0 - 99 mg/dL  Microalbumin, Urine Waived  Result Value Ref Range   Microalb, Ur Waived 10 0 - 19 mg/L   Creatinine, Urine Waived 200 10 - 300 mg/dL   Microalb/Creat Ratio <30 <30 mg/g  TSH  Result Value Ref Range   TSH 1.520 0.450 - 4.500 uIU/mL  UA/M w/rflx Culture, Routine  Result Value Ref Range   Specific Gravity, UA 1.015 1.005 - 1.030   pH, UA 7.5 5.0 - 7.5   Color, UA Yellow Yellow   Appearance Ur Clear Clear   Leukocytes, UA Negative Negative   Protein, UA Negative Negative/Trace   Glucose, UA Negative Negative   Ketones, UA Negative Negative   RBC, UA Negative Negative   Bilirubin, UA Negative Negative   Urobilinogen, Ur 0.2 0.2 - 1.0 mg/dL   Nitrite, UA Negative Negative      Assessment & Plan:   Problem List Items Addressed This Visit    None    Visit Diagnoses    Routine general medical examination at a health care facility    -  Primary   Vaccines declined. Screening labs checked last visit. Continue diet and exercise. Call with any concerns.    Toe pain, chronic, left       Would like to see podiatry. Referral generated today.   Relevant Orders   Ambulatory referral to Podiatry   Geographic tongue       Reassured patient. Call with any concerns.         LABORATORY TESTING:  Health maintenance labs ordered last visit.   IMMUNIZATIONS:   - Tdap: Tetanus vaccination status reviewed: Refused. - Influenza: Postponed to flu season - Pneumovax: Not applicable - Prevnar: Not applicable   PATIENT  COUNSELING:    Sexuality: Discussed sexually transmitted diseases, partner selection, use of condoms, avoidance of unintended pregnancy  and contraceptive alternatives.   Advised to avoid cigarette smoking.  I discussed with the patient that most people either abstain from alcohol or drink within safe limits (<=14/week and <=4 drinks/occasion for males, <=7/weeks and <= 3 drinks/occasion for females) and that the risk for alcohol disorders and other health effects rises proportionally with the number of drinks per week and how often a drinker exceeds daily limits.  Discussed cessation/primary prevention of drug use and availability of treatment for abuse.   Diet: Encouraged to adjust caloric intake to maintain  or achieve ideal body weight, to reduce intake of  dietary saturated fat and total fat, to limit sodium intake by avoiding high sodium foods and not adding table salt, and to maintain adequate dietary potassium and calcium preferably from fresh fruits, vegetables, and low-fat dairy products.    stressed the importance of regular exercise  Injury prevention: Discussed safety belts, safety helmets, smoke detector, smoking near bedding or upholstery.   Dental health: Discussed importance of regular tooth brushing, flossing, and dental visits.   Follow up plan: NEXT PREVENTATIVE PHYSICAL DUE IN 1 YEAR. Return in about 6 months (around 05/07/2018) for Follow up BP.

## 2017-11-26 ENCOUNTER — Ambulatory Visit (INDEPENDENT_AMBULATORY_CARE_PROVIDER_SITE_OTHER): Payer: 59

## 2017-11-26 ENCOUNTER — Encounter: Payer: Self-pay | Admitting: Podiatry

## 2017-11-26 ENCOUNTER — Ambulatory Visit (INDEPENDENT_AMBULATORY_CARE_PROVIDER_SITE_OTHER): Payer: 59 | Admitting: Podiatry

## 2017-11-26 VITALS — BP 132/84 | HR 68

## 2017-11-26 DIAGNOSIS — M205X2 Other deformities of toe(s) (acquired), left foot: Secondary | ICD-10-CM | POA: Diagnosis not present

## 2017-11-26 DIAGNOSIS — M79672 Pain in left foot: Secondary | ICD-10-CM | POA: Diagnosis not present

## 2017-11-26 DIAGNOSIS — M205X9 Other deformities of toe(s) (acquired), unspecified foot: Secondary | ICD-10-CM

## 2017-11-26 DIAGNOSIS — M205X1 Other deformities of toe(s) (acquired), right foot: Secondary | ICD-10-CM | POA: Diagnosis not present

## 2017-11-26 DIAGNOSIS — M79671 Pain in right foot: Secondary | ICD-10-CM

## 2017-11-26 MED ORDER — DICLOFENAC SODIUM 75 MG PO TBEC: 75.0000 mg | DELAYED_RELEASE_TABLET | Freq: Two times a day (BID) | ORAL | 1 refills | Status: DC

## 2017-11-28 NOTE — Progress Notes (Signed)
   HPI: 42 year old male presenting today as a new patient with a chief complaint of pain to the 1st MPJ of bilateral feet that has been ongoing for several years. He states the right is greater than the left. Bending, walking and touching the area increases the pain. He has taped the toes and taking Ibuprofen for treatment. Patient is here for further evaluation and treatment.   Past Medical History:  Diagnosis Date  . Bulging lumbar disc   . Cholinergic urticaria   . 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 History of septic arthritis 2000   L knee  . Hypertension      Physical Exam: General: The patient is alert and oriented x3 in no acute distress.  Dermatology: Skin is warm, dry and supple bilateral lower extremities. Negative for open lesions or macerations.  Vascular: Palpable pedal pulses bilaterally. No edema or erythema noted. Capillary refill within normal limits.  Neurological: Epicritic and protective threshold grossly intact bilaterally.   Musculoskeletal Exam: Pain on palpation with limited range of motion noted to the first MPJ bilateral feet.   Radiographic Exam: Degenerative changes noted with joint space narrowing first MPJ. There also appears to be extra-articular spurring noted about the joint.  Range of motion within normal limits to all pedal and ankle joints bilateral. Muscle strength 5/5 in all groups bilateral.   Assessment: 1. Hallux limitus bilateral    Plan of Care:  1. Patient evaluated. X-Rays reviewed.  2. Injection of 0.5 mLs Celestone Soluspan injected into the 1st MPJ of bilateral feet.  3. Prescription for Diclofenac provided to patient.  4. Discussed possible surgery for the future.  5. Return to clinic in 4-6 weeks.       Edrick Kins, DPM [facility]  Dr. Edrick Kins, DPM    [address], [place] [address]                Office [phone]  Fax [phone]

## 2017-12-01 ENCOUNTER — Telehealth: Payer: Self-pay | Admitting: Podiatry

## 2017-12-01 NOTE — Telephone Encounter (Signed)
Dr.Evans gave me an injection on Friday, I was wondering how soon I should expect relief. My feet are more painful than before I got the shot. I just wanted to speak with you about what I should be experiencing.

## 2017-12-01 NOTE — Telephone Encounter (Signed)
I spoke with pt and told him unfortunately on occasion a steroid injection can cause a flare of the symptoms, to treat with antiinflammatories, ice therapy 3-4 times daily for 15-8minutes/session, and it may last 2-3 days. Pt asked if the injection would be permanent or temporary. I told pt if often depended on the cause of the problem, if the injection would be long lasting.

## 2017-12-02 ENCOUNTER — Other Ambulatory Visit: Payer: Self-pay

## 2017-12-02 ENCOUNTER — Ambulatory Visit (INDEPENDENT_AMBULATORY_CARE_PROVIDER_SITE_OTHER): Payer: 59 | Admitting: Family Medicine

## 2017-12-02 ENCOUNTER — Encounter: Payer: Self-pay | Admitting: Family Medicine

## 2017-12-02 VITALS — BP 136/90 | HR 71 | Temp 98.1°F | Ht 74.0 in | Wt 252.0 lb

## 2017-12-02 DIAGNOSIS — R1013 Epigastric pain: Secondary | ICD-10-CM | POA: Diagnosis not present

## 2017-12-02 MED ORDER — PANTOPRAZOLE SODIUM 40 MG PO TBEC: 40.0000 mg | DELAYED_RELEASE_TABLET | Freq: Every day | ORAL | 3 refills | Status: DC

## 2017-12-02 MED ORDER — SUCRALFATE 1 G PO TABS: 1.0000 g | ORAL_TABLET | Freq: Three times a day (TID) | ORAL | 0 refills | Status: DC

## 2017-12-02 NOTE — Progress Notes (Signed)
BP 136/90   Pulse 71   Temp 98.1 F (36.7 C) (Oral)   Ht 6\' 2"  (1.88 m)   Wt 252 lb (114.3 kg)   SpO2 97%   BMI 32.35 kg/m    Subjective:    Patient ID: John Huang, male    DOB: 10/24/1975, 42 y.o.   MRN: [id number]  HPI: KADDEN OSTERHOUT is a 42 y.o. male  Chief Complaint  Patient presents with  . Abdominal Pain    since last Sat per pt.   1.5 weeks of upper abdominal pain. Felt like tightness and gas pain initially. Tried cutting out dairy and red meat the past few weeks. Just recently started on diclofenac, stopped taking that a few days ago.  Taking TUMS and zantac which he doesn't feel is helping. Denies vomiting, constipation, fevers, melena.   Relevant past medical, surgical, family and social history reviewed and updated as indicated. Interim medical history since our last visit reviewed. Allergies and medications reviewed and updated.  Review of Systems  Per HPI unless specifically indicated above     Objective:    BP 136/90   Pulse 71   Temp 98.1 F (36.7 C) (Oral)   Ht 6\' 2"  (1.88 m)   Wt 252 lb (114.3 kg)   SpO2 97%   BMI 32.35 kg/m   Wt Readings from Last 3 Encounters:  12/02/17 252 lb (114.3 kg)  11/05/17 251 lb 3.2 oz (113.9 kg)  10/21/17 250 lb 8 oz (113.6 kg)    Physical Exam  Constitutional: He is oriented to person, place, and time. He appears well-developed and well-nourished. No distress.  HENT:  Head: Atraumatic.  Eyes: Conjunctivae are normal.  Neck: Normal range of motion. Neck supple.  Cardiovascular: Normal rate and regular rhythm.  Pulmonary/Chest: Effort normal and breath sounds normal.  Abdominal: Soft. Normal appearance and bowel sounds are normal. He exhibits no mass. There is tenderness (epigastric ttp). There is no guarding.  Musculoskeletal: Normal range of motion.  Neurological: He is alert and oriented to person, place, and time.  Skin: Skin is warm and dry.  Psychiatric: He has a normal mood and affect. His  behavior is normal.  Nursing note and vitals reviewed.   Results for orders placed or performed in visit on 10/21/17  HIV antibody  Result Value Ref Range   HIV 1&2 Ab, 4th Generation negative   CBC with Differential/Platelet  Result Value Ref Range   WBC 5.0 3.4 - 10.8 x10E3/uL   RBC 4.92 4.14 - 5.80 x10E6/uL   Hemoglobin 14.3 13.0 - 17.7 g/dL   Hematocrit 42.4 37.5 - 51.0 %   MCV 86 79 - 97 fL   MCH 29.1 26.6 - 33.0 pg   MCHC 33.7 31.5 - 35.7 g/dL   RDW 16.3 (H) 12.3 - 15.4 %   Platelets 267 150 - 450 x10E3/uL   Neutrophils 43 Not Estab. %   Lymphs 44 Not Estab. %   Monocytes 10 Not Estab. %   Eos 3 Not Estab. %   Basos 0 Not Estab. %   Neutrophils Absolute 2.1 1.4 - 7.0 x10E3/uL   Lymphocytes Absolute 2.2 0.7 - 3.1 x10E3/uL   Monocytes Absolute 0.5 0.1 - 0.9 x10E3/uL   EOS (ABSOLUTE) 0.1 0.0 - 0.4 x10E3/uL   Basophils Absolute 0.0 0.0 - 0.2 x10E3/uL   Immature Granulocytes 0 Not Estab. %   Immature Grans (Abs) 0.0 0.0 - 0.1 x10E3/uL  Comprehensive metabolic panel  Result Value Ref  Range   Glucose 77 65 - 99 mg/dL   BUN 14 6 - 24 mg/dL   Creatinine, Ser 1.05 0.76 - 1.27 mg/dL   GFR calc non Af Amer 87 >59 mL/min/1.73   GFR calc Af Amer 101 >59 mL/min/1.73   BUN/Creatinine Ratio 13 9 - 20   Sodium 144 134 - 144 mmol/L   Potassium 4.3 3.5 - 5.2 mmol/L   Chloride 106 96 - 106 mmol/L   CO2 25 20 - 29 mmol/L   Calcium 9.7 8.7 - 10.2 mg/dL   Total Protein 7.5 6.0 - 8.5 g/dL   Albumin 4.6 3.5 - 5.5 g/dL   Globulin, Total 2.9 1.5 - 4.5 g/dL   Albumin/Globulin Ratio 1.6 1.2 - 2.2   Bilirubin Total <0.2 0.0 - 1.2 mg/dL   Alkaline Phosphatase 58 39 - 117 IU/L   AST 18 0 - 40 IU/L   ALT 24 0 - 44 IU/L  Lipid Panel w/o Chol/HDL Ratio  Result Value Ref Range   Cholesterol, Total 149 100 - 199 mg/dL   Triglycerides 206 (H) 0 - 149 mg/dL   HDL 33 (L) >39 mg/dL   VLDL Cholesterol Cal 41 (H) 5 - 40 mg/dL   LDL Calculated 75 0 - 99 mg/dL  Microalbumin, Urine Waived  Result  Value Ref Range   Microalb, Ur Waived 10 0 - 19 mg/L   Creatinine, Urine Waived 200 10 - 300 mg/dL   Microalb/Creat Ratio <30 <30 mg/g  TSH  Result Value Ref Range   TSH 1.520 0.450 - 4.500 uIU/mL  UA/M w/rflx Culture, Routine  Result Value Ref Range   Specific Gravity, UA 1.015 1.005 - 1.030   pH, UA 7.5 5.0 - 7.5   Color, UA Yellow Yellow   Appearance Ur Clear Clear   Leukocytes, UA Negative Negative   Protein, UA Negative Negative/Trace   Glucose, UA Negative Negative   Ketones, UA Negative Negative   RBC, UA Negative Negative   Bilirubin, UA Negative Negative   Urobilinogen, Ur 0.2 0.2 - 1.0 mg/dL   Nitrite, UA Negative Negative      Assessment & Plan:   Problem List Items Addressed This Visit    None    Visit Diagnoses    Epigastric pain    -  Primary   Suspect gatritis from recent addition of diclofenac. Continue d/c, start protonix, carafate. Continue zantac and TUMS prn. F/u if no improvement       Follow up plan: Return if symptoms worsen or fail to improve.

## 2017-12-05 NOTE — Patient Instructions (Signed)
Follow up as needed

## 2017-12-14 ENCOUNTER — Ambulatory Visit (INDEPENDENT_AMBULATORY_CARE_PROVIDER_SITE_OTHER): Payer: 59 | Admitting: Family Medicine

## 2017-12-14 ENCOUNTER — Encounter: Payer: Self-pay | Admitting: Family Medicine

## 2017-12-14 ENCOUNTER — Other Ambulatory Visit: Payer: Self-pay

## 2017-12-14 VITALS — BP 138/91 | HR 60 | Temp 97.8°F | Ht 74.0 in | Wt 253.0 lb

## 2017-12-14 DIAGNOSIS — L505 Cholinergic urticaria: Secondary | ICD-10-CM

## 2017-12-14 MED ORDER — CITALOPRAM HYDROBROMIDE 40 MG PO TABS: 40.0000 mg | ORAL_TABLET | Freq: Every day | ORAL | 1 refills | Status: DC

## 2017-12-14 NOTE — Assessment & Plan Note (Addendum)
Not under good control. Will increase his celexa to see if it helps. Will get him into allergy for evaluation. Referral generated today. Call with any concerns.

## 2017-12-14 NOTE — Progress Notes (Signed)
BP (!) 138/91   Pulse 60   Temp 97.8 F (36.6 C) (Oral)   Ht 6\' 2"  (1.88 m)   Wt 253 lb (114.8 kg)   SpO2 97%   BMI 32.48 kg/m    Subjective:    Patient ID: John Huang, male    DOB: 17-Apr-1976, 42 y.o.   MRN: [id number]  HPI: John Huang is a 42 y.o. male  Chief Complaint  Patient presents with  . Urticaria   No better on the celexa and the zantac and benadryl. Still scratching himself until he bleeds. He does note that it seems to be worse when he's stressed. He notes that he has physical responses to his histamine. He has considered doing counseling- which he has done in the past, but it doesn't seem to be helping. He notes that his dreams are a lot better on the citalopram and feels like it's helping, he notices when he doesn't have it. He was on an injection through allergy/rheumatology in the past, but had to stop it when he changed his insurance. He was on it for 3 months at that time.  He notes that he was on a lot of other stuff at that time, so he's not sure if it was helping. He is hoping that it would help. He has been doing a very careful diet and avoiding red meat and dairy. He has been taking vitamins. None of it seems to be helping.   Relevant past medical, surgical, family and social history reviewed and updated as indicated. Interim medical history since our last visit reviewed. Allergies and medications reviewed and updated.  Review of Systems  Constitutional: Negative.   Respiratory: Negative.   Cardiovascular: Negative.   Musculoskeletal: Negative.   Skin: Positive for rash. Negative for color change, pallor and wound.  Neurological: Negative.   Psychiatric/Behavioral: Negative for agitation, behavioral problems, confusion, decreased concentration, dysphoric mood, hallucinations, self-injury, sleep disturbance and suicidal ideas. The patient is nervous/anxious. The patient is not hyperactive.     Per HPI unless specifically indicated above       Objective:    BP (!) 138/91   Pulse 60   Temp 97.8 F (36.6 C) (Oral)   Ht 6\' 2"  (1.88 m)   Wt 253 lb (114.8 kg)   SpO2 97%   BMI 32.48 kg/m   Wt Readings from Last 3 Encounters:  12/14/17 253 lb (114.8 kg)  12/02/17 252 lb (114.3 kg)  11/05/17 251 lb 3.2 oz (113.9 kg)    Physical Exam  Constitutional: He is oriented to person, place, and time. He appears well-developed and well-nourished. No distress.  HENT:  Head: Normocephalic and atraumatic.  Right Ear: Hearing normal.  Left Ear: Hearing normal.  Nose: Nose normal.  Eyes: Conjunctivae and lids are normal. Right eye exhibits no discharge. Left eye exhibits no discharge. No scleral icterus.  Cardiovascular: Normal rate, regular rhythm, normal heart sounds and intact distal pulses. Exam reveals no gallop and no friction rub.  No murmur heard. Pulmonary/Chest: Effort normal and breath sounds normal. No stridor. No respiratory distress. He has no wheezes. He has no rales. He exhibits no tenderness.  Musculoskeletal: Normal range of motion.  Neurological: He is alert and oriented to person, place, and time.  Skin: Skin is warm, dry and intact. Capillary refill takes less than 2 seconds. Rash noted. He is not diaphoretic. No erythema. No pallor.  Psychiatric: He has a normal mood and affect. His speech is normal and  behavior is normal. Judgment and thought content normal. Cognition and memory are normal.  Nursing note and vitals reviewed.   Results for orders placed or performed in visit on 10/21/17  HIV antibody  Result Value Ref Range   HIV 1&2 Ab, 4th Generation negative   CBC with Differential/Platelet  Result Value Ref Range   WBC 5.0 3.4 - 10.8 x10E3/uL   RBC 4.92 4.14 - 5.80 x10E6/uL   Hemoglobin 14.3 13.0 - 17.7 g/dL   Hematocrit 42.4 37.5 - 51.0 %   MCV 86 79 - 97 fL   MCH 29.1 26.6 - 33.0 pg   MCHC 33.7 31.5 - 35.7 g/dL   RDW 16.3 (H) 12.3 - 15.4 %   Platelets 267 150 - 450 x10E3/uL   Neutrophils 43 Not  Estab. %   Lymphs 44 Not Estab. %   Monocytes 10 Not Estab. %   Eos 3 Not Estab. %   Basos 0 Not Estab. %   Neutrophils Absolute 2.1 1.4 - 7.0 x10E3/uL   Lymphocytes Absolute 2.2 0.7 - 3.1 x10E3/uL   Monocytes Absolute 0.5 0.1 - 0.9 x10E3/uL   EOS (ABSOLUTE) 0.1 0.0 - 0.4 x10E3/uL   Basophils Absolute 0.0 0.0 - 0.2 x10E3/uL   Immature Granulocytes 0 Not Estab. %   Immature Grans (Abs) 0.0 0.0 - 0.1 x10E3/uL  Comprehensive metabolic panel  Result Value Ref Range   Glucose 77 65 - 99 mg/dL   BUN 14 6 - 24 mg/dL   Creatinine, Ser 1.05 0.76 - 1.27 mg/dL   GFR calc non Af Amer 87 >59 mL/min/1.73   GFR calc Af Amer 101 >59 mL/min/1.73   BUN/Creatinine Ratio 13 9 - 20   Sodium 144 134 - 144 mmol/L   Potassium 4.3 3.5 - 5.2 mmol/L   Chloride 106 96 - 106 mmol/L   CO2 25 20 - 29 mmol/L   Calcium 9.7 8.7 - 10.2 mg/dL   Total Protein 7.5 6.0 - 8.5 g/dL   Albumin 4.6 3.5 - 5.5 g/dL   Globulin, Total 2.9 1.5 - 4.5 g/dL   Albumin/Globulin Ratio 1.6 1.2 - 2.2   Bilirubin Total <0.2 0.0 - 1.2 mg/dL   Alkaline Phosphatase 58 39 - 117 IU/L   AST 18 0 - 40 IU/L   ALT 24 0 - 44 IU/L  Lipid Panel w/o Chol/HDL Ratio  Result Value Ref Range   Cholesterol, Total 149 100 - 199 mg/dL   Triglycerides 206 (H) 0 - 149 mg/dL   HDL 33 (L) >39 mg/dL   VLDL Cholesterol Cal 41 (H) 5 - 40 mg/dL   LDL Calculated 75 0 - 99 mg/dL  Microalbumin, Urine Waived  Result Value Ref Range   Microalb, Ur Waived 10 0 - 19 mg/L   Creatinine, Urine Waived 200 10 - 300 mg/dL   Microalb/Creat Ratio <30 <30 mg/g  TSH  Result Value Ref Range   TSH 1.520 0.450 - 4.500 uIU/mL  UA/M w/rflx Culture, Routine  Result Value Ref Range   Specific Gravity, UA 1.015 1.005 - 1.030   pH, UA 7.5 5.0 - 7.5   Color, UA Yellow Yellow   Appearance Ur Clear Clear   Leukocytes, UA Negative Negative   Protein, UA Negative Negative/Trace   Glucose, UA Negative Negative   Ketones, UA Negative Negative   RBC, UA Negative Negative    Bilirubin, UA Negative Negative   Urobilinogen, Ur 0.2 0.2 - 1.0 mg/dL   Nitrite, UA Negative Negative  Assessment & Plan:   Problem List Items Addressed This Visit      Musculoskeletal and Integument   Cholinergic urticaria - Primary    Not under good control. Will increase his celexa to see if it helps. Will get him into allergy for evaluation. Referral generated today. Call with any concerns.       Relevant Orders   Ambulatory referral to Allergy       Follow up plan: Return in about 8 weeks (around 02/08/2018).

## 2017-12-17 ENCOUNTER — Telehealth: Payer: Self-pay | Admitting: Family Medicine

## 2017-12-17 NOTE — Telephone Encounter (Signed)
Copied from [id number]. Topic: Quick Communication - See Telephone Encounter >> Dec 17, 2017 12:29 PM Synthia Innocent wrote: CRM for notification. See Telephone encounter for: 12/17/17. Requesting order for new CPAP supplies, please send to [facility] in [place], PH# [phone]

## 2017-12-23 NOTE — Telephone Encounter (Signed)
OK to write up and I will sign

## 2017-12-24 NOTE — Telephone Encounter (Signed)
Order written, will get signature and fax.

## 2017-12-31 ENCOUNTER — Encounter: Payer: Self-pay | Admitting: Podiatry

## 2017-12-31 ENCOUNTER — Ambulatory Visit (INDEPENDENT_AMBULATORY_CARE_PROVIDER_SITE_OTHER): Payer: 59 | Admitting: Podiatry

## 2017-12-31 DIAGNOSIS — M205X9 Other deformities of toe(s) (acquired), unspecified foot: Secondary | ICD-10-CM

## 2017-12-31 NOTE — Progress Notes (Signed)
   HPI: 42 year old male presenting today for follow up evaluation of bilateral hallux limitus. He states he is doing much better. The injections have helped alleviate his pain. There are no modifying factors noted. Patient is here for further evaluation and treatment.   Past Medical History:  Diagnosis Date  . Bulging lumbar disc   . CAP (community acquired pneumonia) 02/18/2016  . Cholinergic urticaria   . Epistaxis 05/24/2017  . 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 History of septic arthritis 2000   L knee  . Hypertension      Physical Exam: General: The patient is alert and oriented x3 in no acute distress.  Dermatology: Skin is warm, dry and supple bilateral lower extremities. Negative for open lesions or macerations.  Vascular: Palpable pedal pulses bilaterally. No edema or erythema noted. Capillary refill within normal limits.  Neurological: Epicritic and protective threshold grossly intact bilaterally.   Musculoskeletal Exam: Pain on palpation with limited range of motion noted to the first MPJ bilateral feet.   Assessment: 1. Hallux limitus bilateral    Plan of Care:  1. Patient evaluated.  2. Continue wearing Dansko shoes.  3. Discontinue taking Diclofenac due to GI upset.  4. Continue taking Motrin 600 mg.  5. Return to clinic as needed if patient needs injections or wants to discuss surgery.   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       Edrick Kins, DPM [facility]  Dr. Edrick Kins, DPM    [address], [place] [address]                Office [phone]  Fax [phone]

## 2018-02-22 ENCOUNTER — Other Ambulatory Visit: Payer: Self-pay

## 2018-02-22 ENCOUNTER — Ambulatory Visit
Admission: EM | Admit: 2018-02-22 | Discharge: 2018-02-22 | Disposition: A | Discharge status: Home / Self Care | Payer: 59 | Attending: Family Medicine | Admitting: Family Medicine

## 2018-02-22 DIAGNOSIS — S29019A Strain of muscle and tendon of unspecified wall of thorax, initial encounter: Secondary | ICD-10-CM

## 2018-02-22 DIAGNOSIS — S29012A Strain of muscle and tendon of back wall of thorax, initial encounter: Secondary | ICD-10-CM | POA: Diagnosis not present

## 2018-02-22 DIAGNOSIS — X58XXXA Exposure to other specified factors, initial encounter: Secondary | ICD-10-CM | POA: Diagnosis not present

## 2018-02-22 DIAGNOSIS — M545 Low back pain: Secondary | ICD-10-CM | POA: Diagnosis not present

## 2018-02-22 LAB — URINALYSIS, COMPLETE (UACMP) WITH MICROSCOPIC
Bacteria, UA: NONE SEEN
Bilirubin Urine: NEGATIVE
Glucose, UA: NEGATIVE mg/dL
Hgb urine dipstick: NEGATIVE
Ketones, ur: NEGATIVE mg/dL
Leukocytes, UA: NEGATIVE
Nitrite: NEGATIVE
Protein, ur: NEGATIVE mg/dL
RBC / HPF: NONE SEEN RBC/hpf (ref 0–5)
Specific Gravity, Urine: 1.015 (ref 1.005–1.030)
pH: 5.5 (ref 5.0–8.0)

## 2018-02-22 MED ORDER — KETOROLAC TROMETHAMINE 60 MG/2ML IM SOLN
60.0000 mg | Freq: Once | INTRAMUSCULAR | Status: AC
  Administered 2018-02-22: 60 mg via INTRAMUSCULAR

## 2018-02-22 MED ORDER — NAPROXEN 500 MG PO TABS: 500.0000 mg | ORAL_TABLET | Freq: Two times a day (BID) | ORAL | 0 refills | Status: DC

## 2018-02-22 MED ORDER — METAXALONE 800 MG PO TABS: 800.0000 mg | ORAL_TABLET | Freq: Three times a day (TID) | ORAL | 0 refills | Status: DC

## 2018-02-22 NOTE — ED Triage Notes (Signed)
Patient complains of back pain that is on left side. Patient states that his wife is a PT and she worked on his back. Patient states that it feels like the worst gas pain. Patient states that this started yesterday morning and worsened around 3am this morning.

## 2018-02-22 NOTE — Discharge Instructions (Signed)
Apply ice 20 minutes out of every 2 hours 4-5 times daily for comfort. Use  Caution while taking muscle relaxers.  Do not perform activities requiring concentration or judgment and do not drive.  °

## 2018-02-22 NOTE — ED Provider Notes (Signed)
[facility]    CSN: [id number] Arrival date & time: 02/22/18  0919     History   Chief Complaint Chief Complaint  Patient presents with  . Back Pain    HPI John Huang is a 42 y.o. male.   HPI  42 year old male presents with left-sided back pain will radiate around his flank and into his left groin.  Classifies it as the worst gas pain that he has had.  Started yesterday morning seem to get a little better but continued him at 3 AM which was much more severe as a 10 out of 10.  Currently he has a 6-7 out of 10 pain profile.  Certain motions  worsens the pain eg.  bending forward to arise from a seated position, right lateral flexion and right thoracic rotation.  Denies radicular symptoms into his buttock or leg.  Has had about a week of feeling poorly.  Had night sweats and his wife mentioned that his stool this morning was very malodorous.  He works as a Biomedical scientist.  Never had kidney stones in the past.  He has not noticed any change in his urine or bowel.  He said no nausea or vomiting.  Is a history of a H&P at L4-5 which caused him radicular symptoms and a great deal of pain.        Past Medical History:  Diagnosis Date  . Bulging lumbar disc   . CAP (community acquired pneumonia) 02/18/2016  . Cholinergic urticaria   . Epistaxis 05/24/2017  . 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 History of septic arthritis 2000   L knee  . Hypertension     Patient Active Problem List   Diagnosis Date Noted  . 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 Hypertension   . Cholinergic urticaria   . Bulging lumbar disc   . Acute sinusitis 05/24/2017  . Deviated nasal septum 05/24/2017  . Nasal vestibulitis 05/24/2017  . GERD (gastroesophageal reflux disease) 02/18/2016    Past Surgical History:  Procedure Laterality Date  . ANTERIOR CRUCIATE LIGAMENT REPAIR    . MEDIAL COLLATERAL LIGAMENT AND LATERAL COLLATERAL LIGAMENT REPAIR, KNEE    . SHOULDER SURGERY      dislocation and tac left in there, so had to have surgery to removed the tac  . WISDOM TOOTH EXTRACTION         Home Medications    Prior to Admission medications   Medication Sig Start Date End Date Taking? Authorizing Provider  amLODipine (NORVASC) 5 MG tablet Take 1 tablet (5 mg total) by mouth daily. 10/21/17  Yes Johnson, Megan P, DO  citalopram (CELEXA) 40 MG tablet Take 1 tablet (40 mg total) by mouth daily. 12/14/17 12/14/18 Yes Johnson, Megan P, DO  fexofenadine (ALLEGRA) 180 MG tablet Take 1-2 tablets (180-360 mg total) by mouth daily. 10/21/17  Yes Johnson, Megan P, DO  ibuprofen (ADVIL,MOTRIN) 800 MG tablet Take by mouth. 05/14/17 05/14/18 Yes [provider]  Multiple Vitamin (MULTI-VITAMINS) TABS Take by mouth.   Yes [provider]  metaxalone (SKELAXIN) 800 MG tablet Take 1 tablet (800 mg total) by mouth 3 (three) times daily. 02/22/18   Lorin Picket, PA-C  naproxen (NAPROSYN) 500 MG tablet Take 1 tablet (500 mg total) by mouth 2 (two) times daily with a meal. 02/22/18   Lorin Picket, PA-C    Family History Family History  Problem Relation Age of Onset  . Stroke Mother   .  Hypertension Mother   . Diabetes Mother   . Cancer Father        Throat  . Cancer Sister        Eye/Brain  . Hypertension Brother   . Irritable bowel syndrome Brother     Social History Social History   Tobacco Use  . Smoking status: Never Smoker  . Smokeless tobacco: Never Used  Substance Use Topics  . Alcohol use: Yes    Comment: occasion  . Drug use: Never     Allergies   Cat hair extract and Galactose   Review of Systems Review of Systems  Constitutional: Positive for activity change and fatigue. Negative for appetite change, chills and fever.  Gastrointestinal: Negative for abdominal distention, abdominal pain, blood in stool, constipation, diarrhea, nausea and vomiting.  Musculoskeletal: Positive for back pain.  All other systems reviewed and are  negative.    Physical Exam Triage Vital Signs ED Triage Vitals  Enc Vitals Group     BP 02/22/18 0945 (!) 140/94     Pulse Rate 02/22/18 0945 69     Resp 02/22/18 0945 18     Temp 02/22/18 0945 98.8 F (37.1 C)     Temp Source 02/22/18 0945 Oral     SpO2 02/22/18 0945 100 %     Weight 02/22/18 0945 247 lb (112 kg)     Height 02/22/18 0945 6\' 2"  (1.88 m)     Head Circumference --      Peak Flow --      Pain Score 02/22/18 0944 6     Pain Loc --      Pain Edu? --      Excl. in [place]? --    No data found.  Updated Vital Signs BP (!) 140/94 (BP Location: Left Arm)   Pulse 69   Temp 98.8 F (37.1 C) (Oral)   Resp 18   Ht 6\' 2"  (1.88 m)   Wt 247 lb (112 kg)   SpO2 100%   BMI 31.71 kg/m   Visual Acuity Right Eye Distance:   Left Eye Distance:   Bilateral Distance:    Right Eye Near:   Left Eye Near:    Bilateral Near:     Physical Exam  Constitutional: He is oriented to person, place, and time. He appears well-developed and well-nourished. No distress.  HENT:  Head: Normocephalic.  Eyes: Pupils are equal, round, and reactive to light. Right eye exhibits no discharge. Left eye exhibits no discharge.  Neck: Normal range of motion.  Abdominal: Soft. Bowel sounds are normal.  Emanation of the abdomen shows no distention.  There is no rebound guarding; no masses are palpable. he has no CVA tenderness.  Palpation of the spine and paraspinous muscles in the recumbent position shows no significant tenderness.  He has no tenderness of the ribs.  Has a negative Murphy sign.  There is no tenderness in the left upper quadrant.  Spleen is nonpalpable.  Musculoskeletal: Normal range of motion. He exhibits tenderness.  Examination of the back shows full flexion with the patient able to reach his hands to the level of his ankles.  Returning to upright posture is when he is experiences left thoracolumbar discomfort.  Right lateral flexion also reproduces the symptoms as does left  thoracic rotation.  Neurological: He is alert and oriented to person, place, and time.  Skin: Skin is warm and dry. He is not diaphoretic.  Psychiatric: He has a normal mood and affect. His  behavior is normal. Thought content normal.  Nursing note and vitals reviewed.    UC Treatments / Results  Labs (all labs ordered are listed, but only abnormal results are displayed) Labs Reviewed  URINALYSIS, COMPLETE (UACMP) WITH MICROSCOPIC    EKG None  Radiology No results found.  Procedures Procedures (including critical care time)  Medications Ordered in UC Medications  ketorolac (TORADOL) injection 60 mg (60 mg Intramuscular Given 02/22/18 1105)    Initial Impression / Assessment and Plan / UC Course  I have reviewed the triage vital signs and the nursing notes.  Pertinent labs & imaging results that were available during my care of the patient were reviewed by me and considered in my medical decision making (see chart for details).     I told the patient that his negative exam of the abdomen and negative urinalysis are encouraging for no kidney stone. the negative abdominal exam is also reassuring.  Pain with motion is his major precipitating event, because it leads me to think this is more musculoskeletal, possibly a disc injury .l luckily there does not seem to be any compromise.  Will treat him conservatively at this time with muscle relaxers and nonsteroidal anti-inflammatory drugs and if his symptoms worsen he needs to go to the emergency room for further imaging.  He got complete relief with the Toradol injection. Final Clinical Impressions(s) / UC Diagnoses   Final diagnoses:  Thoracic myofascial strain, initial encounter     Discharge Instructions     Apply ice 20 minutes out of every 2 hours 4-5 times daily for comfort. Use  Caution while taking muscle relaxers.  Do not perform activities requiring concentration or judgment and do not drive.     ED Prescriptions      Medication Sig Dispense Auth. Provider   naproxen (NAPROSYN) 500 MG tablet Take 1 tablet (500 mg total) by mouth 2 (two) times daily with a meal. 60 tablet Lorin Picket, PA-C   metaxalone (SKELAXIN) 800 MG tablet Take 1 tablet (800 mg total) by mouth 3 (three) times daily. 21 tablet Lorin Picket, PA-C     Controlled Substance Prescriptions [place] Controlled Substance Registry consulted? Not Applicable   Lorin Picket, PA-C 02/22/18 1446

## 2018-02-25 ENCOUNTER — Ambulatory Visit (INDEPENDENT_AMBULATORY_CARE_PROVIDER_SITE_OTHER): Payer: 59 | Admitting: Podiatry

## 2018-02-25 ENCOUNTER — Encounter: Payer: Self-pay | Admitting: Podiatry

## 2018-02-25 DIAGNOSIS — M205X1 Other deformities of toe(s) (acquired), right foot: Secondary | ICD-10-CM

## 2018-02-25 DIAGNOSIS — M205X2 Other deformities of toe(s) (acquired), left foot: Secondary | ICD-10-CM

## 2018-02-28 DIAGNOSIS — G4733 Obstructive sleep apnea (adult) (pediatric): Secondary | ICD-10-CM | POA: Insufficient documentation

## 2018-02-28 NOTE — Progress Notes (Signed)
BP (!) 141/97   Pulse 72   Wt 247 lb 4.8 oz (112.2 kg)   SpO2 100%   BMI 31.75 kg/m    Subjective:    Patient ID: John Huang, male    DOB: Nov 08, 1975, 42 y.o.   MRN: [id number]  HPI: John Huang is a 42 y.o. male  Chief Complaint  Patient presents with  . Sleep Apnea    Needs documentation for CPAP supplies; fax from company in exam room.    Went to Urgent care for ?kidney stone last week.   Itching seems to be worse. Stopped his zantac. Has an appointment to see allergy in December.   SLEEP APNEA- mask broke, was doing very well before that, but has been having issues since then.  Sleep apnea status: stable Duration: chronic Satisfied with current treatment?:  yes CPAP use:  yes Sleep quality with CPAP use: excellent Treament compliance:excellent compliance Last sleep study: 3-4 years ago Treatments attempted:  Wakes feeling refreshed:  yes Daytime hypersomnolence:  no Fatigue:  no Insomnia:  no Good sleep hygiene:  no Difficulty falling asleep:  no Difficulty staying asleep:  no Snoring bothers bed partner:  no Observed apnea by bed partner: no Obesity:  yes Hypertension: yes  Pulmonary hypertension:  no Coronary artery disease:  no  Relevant past medical, surgical, family and social history reviewed and updated as indicated. Interim medical history since our last visit reviewed. Allergies and medications reviewed and updated.  Review of Systems  Constitutional: Negative.   Respiratory: Negative.   Cardiovascular: Negative.   Musculoskeletal: Positive for back pain and myalgias. Negative for arthralgias, gait problem, joint swelling, neck pain and neck stiffness.  Skin: Negative.   Psychiatric/Behavioral: Negative.     Per HPI unless specifically indicated above     Objective:    BP (!) 141/97   Pulse 72   Wt 247 lb 4.8 oz (112.2 kg)   SpO2 100%   BMI 31.75 kg/m   Wt Readings from Last 3 Encounters:  03/01/18 247 lb 4.8 oz (112.2 kg)   02/22/18 247 lb (112 kg)  12/14/17 253 lb (114.8 kg)    Physical Exam  Constitutional: He is oriented to person, place, and time. He appears well-developed and well-nourished. No distress.  HENT:  Head: Normocephalic and atraumatic.  Right Ear: Hearing normal.  Left Ear: Hearing normal.  Nose: Nose normal.  Eyes: Conjunctivae and lids are normal. Right eye exhibits no discharge. Left eye exhibits no discharge. No scleral icterus.  Cardiovascular: Normal rate, regular rhythm, normal heart sounds and intact distal pulses. Exam reveals no gallop and no friction rub.  No murmur heard. Pulmonary/Chest: Effort normal and breath sounds normal. No stridor. No respiratory distress. He has no wheezes. He has no rales. He exhibits no tenderness.  Musculoskeletal: Normal range of motion.  Neurological: He is alert and oriented to person, place, and time.  Skin: Skin is warm, dry and intact. Capillary refill takes less than 2 seconds. No rash noted. He is not diaphoretic. No erythema. No pallor.  Psychiatric: He has a normal mood and affect. His speech is normal and behavior is normal. Judgment and thought content normal. Cognition and memory are normal.  Nursing note and vitals reviewed.   Results for orders placed or performed during the hospital encounter of 02/22/18  Urinalysis, Complete w Microscopic  Result Value Ref Range   Color, Urine YELLOW YELLOW   APPearance CLEAR CLEAR   Specific Gravity, Urine 1.015 1.005 -  1.030   pH 5.5 5.0 - 8.0   Glucose, UA NEGATIVE NEGATIVE mg/dL   Hgb urine dipstick NEGATIVE NEGATIVE   Bilirubin Urine NEGATIVE NEGATIVE   Ketones, ur NEGATIVE NEGATIVE mg/dL   Protein, ur NEGATIVE NEGATIVE mg/dL   Nitrite NEGATIVE NEGATIVE   Leukocytes, UA NEGATIVE NEGATIVE   Squamous Epithelial / LPF 0-5 0 - 5   WBC, UA 0-5 0 - 5 WBC/hpf   RBC / HPF NONE SEEN 0 - 5 RBC/hpf   Bacteria, UA NONE SEEN NONE SEEN   Mucus PRESENT       Assessment & Plan:   Problem List  Items Addressed This Visit      Respiratory   OSA (obstructive sleep apnea) - Primary    Has been stable on his CPAP for the last 3-4 years since his sleep study until his mask broke about 2 weeks ago. Has not had CPAP for the last 2 weeks and feeling terrible. Will get new mask and CPAP supplies. Will continue to work on diet and exercise for weight loss. Continue CPAP. Call with any concerns.        Other Visit Diagnoses    Needs flu shot       Flu shot given today.   Relevant Orders   Flu Vaccine QUAD 6+ mos PF IM (Fluarix Quad PF) (Completed)       Follow up plan: Return in about 3 months (around 06/01/2018).

## 2018-03-01 ENCOUNTER — Ambulatory Visit (INDEPENDENT_AMBULATORY_CARE_PROVIDER_SITE_OTHER): Payer: 59 | Admitting: Family Medicine

## 2018-03-01 ENCOUNTER — Encounter: Payer: Self-pay | Admitting: Family Medicine

## 2018-03-01 VITALS — BP 141/97 | HR 72 | Wt 247.3 lb

## 2018-03-01 DIAGNOSIS — Z23 Encounter for immunization: Secondary | ICD-10-CM | POA: Diagnosis not present

## 2018-03-01 DIAGNOSIS — G4733 Obstructive sleep apnea (adult) (pediatric): Secondary | ICD-10-CM

## 2018-03-01 MED ORDER — MELOXICAM 15 MG PO TABS: 15.0000 mg | ORAL_TABLET | Freq: Every day | ORAL | 3 refills | Status: DC

## 2018-03-01 NOTE — Progress Notes (Signed)
   HPI: 42 year old male presenting today for follow up evaluation of bilateral hallux limitus. He states the pain has returned since his last visit. He reports some relief after receiving the injections and would like them again. There are no modifying factors noted. Patient is here for further evaluation and treatment.   Past Medical History:  Diagnosis Date  . Bulging lumbar disc   . CAP (community acquired pneumonia) 02/18/2016  . Cholinergic urticaria   . Epistaxis 05/24/2017  . 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 History of septic arthritis 2000   L knee  . Hypertension      Physical Exam: General: The patient is alert and oriented x3 in no acute distress.  Dermatology: Skin is warm, dry and supple bilateral lower extremities. Negative for open lesions or macerations.  Vascular: Palpable pedal pulses bilaterally. No edema or erythema noted. Capillary refill within normal limits.  Neurological: Epicritic and protective threshold grossly intact bilaterally.   Musculoskeletal Exam: Pain on palpation with limited range of motion noted to the first MPJ bilateral feet.   Assessment: 1. Hallux limitus bilateral    Plan of Care:  1. Patient evaluated.  2. Injection of 0.5 mLs Celestone Soluspan injected into the 1st MPJ of bilateral feet.  3. Continue taking Naproxen 500 mg.  4. Return to clinic as needed if patient needs injections or wants to discuss surgery.  5. Return to clinic as needed.   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       Edrick Kins, DPM [facility]  Dr. Edrick Kins, DPM    [address], [place] [address]                Office [phone]  Fax [phone]

## 2018-03-01 NOTE — Assessment & Plan Note (Signed)
Has been stable on his CPAP for the last 3-4 years since his sleep study until his mask broke about 2 weeks ago. Has not had CPAP for the last 2 weeks and feeling terrible. Will get new mask and CPAP supplies. Will continue to work on diet and exercise for weight loss. Continue CPAP. Call with any concerns.

## 2018-03-04 ENCOUNTER — Encounter: Payer: Self-pay | Admitting: Podiatry

## 2018-03-04 ENCOUNTER — Encounter: Payer: Self-pay | Admitting: Family Medicine

## 2018-03-04 NOTE — Telephone Encounter (Signed)
Called patient and informed him that he would need to schedule an appt to come in and discuss surgery and sign consent forms with Dr.Evans.  He was sent to schedulling.

## 2018-03-22 ENCOUNTER — Encounter: Payer: Self-pay | Admitting: Podiatry

## 2018-03-22 ENCOUNTER — Telehealth: Payer: Self-pay | Admitting: *Deleted

## 2018-03-22 ENCOUNTER — Ambulatory Visit (INDEPENDENT_AMBULATORY_CARE_PROVIDER_SITE_OTHER): Payer: 59 | Admitting: Podiatry

## 2018-03-22 DIAGNOSIS — M205X1 Other deformities of toe(s) (acquired), right foot: Secondary | ICD-10-CM

## 2018-03-22 DIAGNOSIS — M205X2 Other deformities of toe(s) (acquired), left foot: Secondary | ICD-10-CM | POA: Diagnosis not present

## 2018-03-22 NOTE — Patient Instructions (Signed)
Pre-Operative Instructions  Congratulations, you have decided to take an important step towards improving your quality of life.  You can be assured that the doctors and staff at [facility] will be with you every step of the way.  Here are some important things you should know:  1. Plan to be at the surgery center/hospital at least 1 (one) hour prior to your scheduled time, unless otherwise directed by the surgical center/hospital staff.  You must have a responsible adult accompany you, remain during the surgery and drive you home.  Make sure you have directions to the surgical center/hospital to ensure you arrive on time. 2. If you are having surgery at [facility] or [facility] hospitals, you will need a copy of your medical history and physical form from your family physician within one month prior to the date of surgery. We will give you a form for your primary physician to complete.  3. We make every effort to accommodate the date you request for surgery.  However, there are times where surgery dates or times have to be moved.  We will contact you as soon as possible if a change in schedule is required.   4. No aspirin/ibuprofen for one week before surgery.  If you are on aspirin, any non-steroidal anti-inflammatory medications (Mobic, Aleve, Ibuprofen) should not be taken seven (7) days prior to your surgery.  You make take Tylenol for pain prior to surgery.  5. Medications - If you are taking daily heart and blood pressure medications, seizure, reflux, allergy, asthma, anxiety, pain or diabetes medications, make sure you notify the surgery center/hospital before the day of surgery so they can tell you which medications you should take or avoid the day of surgery. 6. No food or drink after midnight the night before surgery unless directed otherwise by surgical center/hospital staff. 7. No alcoholic beverages 24-hours prior to surgery.  No smoking 24-hours prior or 24-hours after  surgery. 8. Wear loose pants or shorts. They should be loose enough to fit over bandages, boots, and casts. 9. Don't wear slip-on shoes. Sneakers are preferred. 10. Bring your boot with you to the surgery center/hospital.  Also bring crutches or a walker if your physician has prescribed it for you.  If you do not have this equipment, it will be provided for you after surgery. 11. If you have not been contacted by the surgery center/hospital by the day before your surgery, call to confirm the date and time of your surgery. 12. Leave-time from work may vary depending on the type of surgery you have.  Appropriate arrangements should be made prior to surgery with your employer. 13. Prescriptions will be provided immediately following surgery by your doctor.  Fill these as soon as possible after surgery and take the medication as directed. Pain medications will not be refilled on weekends and must be approved by the doctor. 14. Remove nail polish on the operative foot and avoid getting pedicures prior to surgery. 15. Wash the night before surgery.  The night before surgery wash the foot and leg well with water and the antibacterial soap provided. Be sure to pay special attention to beneath the toenails and in between the toes.  Wash for at least three (3) minutes. Rinse thoroughly with water and dry well with a towel.  Perform this wash unless told not to do so by your physician.  Enclosed: 1 Ice pack (please put in freezer the night before surgery)   1 Hibiclens skin cleaner     Pre-op instructions  If you have any questions regarding the instructions, please do not hesitate to call our office.  [place]: [address], [place], [place] [address] -- [phone]  [place]: [address] [place], [place] [address] -- [phone]  [place]: [address]  , [place] [address] -- [phone]  [place]: [address], [address], [place], [place] [address] -- [phone]  Website:  [email or web address] 

## 2018-03-22 NOTE — Telephone Encounter (Signed)
"  I'm calling to schedule my surgery with Dr. Amalia Hailey."  Have you signed the consent forms?  "Yes, I have."  Did you do it today?  "Yes, I did."  I need to call you back tomorrow.  I do not know what your procedures are and I don't know how much time he will need to do your surgery.  "Okay, that's fine.  I understand."

## 2018-03-23 ENCOUNTER — Encounter: Payer: Self-pay | Admitting: Podiatry

## 2018-03-24 NOTE — Progress Notes (Signed)
   HPI: 42 year old male presenting today for follow up evaluation of bilateral hallux limitus. He states the pain has not changed since his last visit. He has been taking Naproxen for pain. There are no modifying factors noted. Patient is here for further evaluation and treatment.   Past Medical History:  Diagnosis Date  . Bulging lumbar disc   . CAP (community acquired pneumonia) 02/18/2016  . Cholinergic urticaria   . Epistaxis 05/24/2017  . 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 History of septic arthritis 2000   L knee  . Hypertension      Physical Exam: General: The patient is alert and oriented x3 in no acute distress.  Dermatology: Skin is warm, dry and supple bilateral lower extremities. Negative for open lesions or macerations.  Vascular: Palpable pedal pulses bilaterally. No edema or erythema noted. Capillary refill within normal limits.  Neurological: Epicritic and protective threshold grossly intact bilaterally.   Musculoskeletal Exam: Pain on palpation with limited range of motion noted to the first MPJ bilateral feet.   Assessment: 1. Hallux limitus bilateral    Plan of Care:  1. Patient evaluated.  2. Today we discussed the conservative versus surgical management of the presenting pathology. The patient opts for surgical management. All possible complications and details of the procedure were explained. All patient questions were answered. No guarantees were expressed or implied. 3. Authorization for surgery was initiated today. Surgery will consist of cheilectomy bilateral.  4. Post op shoes dispensed bilaterally.  5. Return to clinic one week post op.   Chef at [facility] in [place].       Edrick Kins, DPM [facility]  Dr. Edrick Kins, DPM    [address], [place] [address]                Office [phone]  Fax [phone]

## 2018-03-24 NOTE — Telephone Encounter (Signed)
"  I'm calling to see if I can schedule my surgery."  Do you have a date in mind that you would like to do it on?  "I would like to have it done before my baby arrives, my wife is due in March."  Dr. Amalia Hailey can do it on June 02, 2018.  "That far away!  My foot is killing me.  I don't want to have to wait that long in pain."  That is is next available date.  We are in the peak season, several people are trying to have their surgery before their deductible starts over.  "I think that's why my wife told me to try and get the surgery scheduled for this year.  Can you let me know if there are any cancellations?"  Yes, I will put you on my wait list.

## 2018-03-30 ENCOUNTER — Telehealth: Payer: Self-pay | Admitting: *Deleted

## 2018-03-30 NOTE — Telephone Encounter (Signed)
I am calling in regards to your surgery.  We were able to open up some additional slots for surgery on Fridays for Dr. Amalia Hailey.  So, I'm calling to see if you would like to reschedule your surgery to a sooner date.  "What does he have available?"  He can do it on December 27, 20, or 13.  "Let's schedule it for December 27, that way I can have it done before my deductible starts over.

## 2018-05-04 DIAGNOSIS — L501 Idiopathic urticaria: Secondary | ICD-10-CM | POA: Diagnosis not present

## 2018-05-04 DIAGNOSIS — L505 Cholinergic urticaria: Secondary | ICD-10-CM | POA: Diagnosis not present

## 2018-05-06 ENCOUNTER — Other Ambulatory Visit: Payer: Self-pay | Admitting: Family Medicine

## 2018-05-06 NOTE — Telephone Encounter (Signed)
Requested Prescriptions  Pending Prescriptions Disp Refills  . amLODipine (NORVASC) 5 MG tablet [Pharmacy Med Name: AMLODIPINE BESYLATE 5 MG TA 5 TAB] 90 tablet 1    Sig: TAKE 1 TABLET BY MOUTH DAILY.     Cardiovascular:  Calcium Channel Blockers Failed - 05/06/2018 11:54 AM      Failed - Last BP in normal range    BP Readings from Last 1 Encounters:  03/01/18 (!) 141/97         Passed - Valid encounter within last 6 months    Recent Outpatient Visits          2 months ago OSA (obstructive sleep apnea)   [facility], Megan P, DO   4 months ago Cholinergic urticaria   [place], [place], DO   5 months ago Epigastric pain   [facility], [place]   6 months ago Routine general medical examination at a health care facility   [facility], [place], DO   6 months ago Essential hypertension   [place], [place], DO

## 2018-05-13 ENCOUNTER — Other Ambulatory Visit: Payer: Self-pay | Admitting: Podiatry

## 2018-05-13 ENCOUNTER — Encounter: Payer: Self-pay | Admitting: Podiatry

## 2018-05-13 DIAGNOSIS — M205X1 Other deformities of toe(s) (acquired), right foot: Secondary | ICD-10-CM | POA: Diagnosis not present

## 2018-05-13 DIAGNOSIS — I10 Essential (primary) hypertension: Secondary | ICD-10-CM | POA: Diagnosis not present

## 2018-05-13 DIAGNOSIS — M2022 Hallux rigidus, left foot: Secondary | ICD-10-CM | POA: Diagnosis not present

## 2018-05-13 DIAGNOSIS — M205X2 Other deformities of toe(s) (acquired), left foot: Secondary | ICD-10-CM | POA: Diagnosis not present

## 2018-05-13 DIAGNOSIS — M2021 Hallux rigidus, right foot: Secondary | ICD-10-CM | POA: Diagnosis not present

## 2018-05-13 MED ORDER — OXYCODONE-ACETAMINOPHEN 5-325 MG PO TABS: 1.0000 | ORAL_TABLET | Freq: Four times a day (QID) | ORAL | 0 refills | Status: DC | PRN

## 2018-05-13 NOTE — Progress Notes (Signed)
.  postop

## 2018-05-13 NOTE — Progress Notes (Signed)
Post op

## 2018-05-18 HISTORY — PX: TONSILLECTOMY: SUR1361

## 2018-05-18 HISTORY — PX: FOOT SURGERY: SHX648

## 2018-05-19 ENCOUNTER — Ambulatory Visit
Admission: EM | Admit: 2018-05-19 | Discharge: 2018-05-19 | Disposition: A | Discharge status: Home / Self Care | Payer: 59 | Attending: Family Medicine | Admitting: Family Medicine

## 2018-05-19 ENCOUNTER — Encounter: Payer: Self-pay | Admitting: Emergency Medicine

## 2018-05-19 DIAGNOSIS — J069 Acute upper respiratory infection, unspecified: Secondary | ICD-10-CM | POA: Diagnosis not present

## 2018-05-19 DIAGNOSIS — B370 Candidal stomatitis: Secondary | ICD-10-CM | POA: Insufficient documentation

## 2018-05-19 MED ORDER — NYSTATIN 100000 UNIT/ML MT SUSP: 500000.0000 [IU] | Freq: Four times a day (QID) | OROMUCOSAL | 0 refills | Status: DC

## 2018-05-19 MED ORDER — HYDROCOD POLST-CPM POLST ER 10-8 MG/5ML PO SUER: 5.0000 mL | Freq: Two times a day (BID) | ORAL | 0 refills | Status: DC

## 2018-05-19 MED ORDER — BENZONATATE 200 MG PO CAPS: ORAL_CAPSULE | ORAL | 0 refills | Status: DC

## 2018-05-19 NOTE — ED Triage Notes (Signed)
Pt reports cough and congestion over last 3 days denies known fever. Pt also reports Hx of recurring thrush recently and would like that evaluated

## 2018-05-19 NOTE — ED Provider Notes (Signed)
[facility]    CSN: [id number] Arrival date & time: 05/19/18  1049     History   Chief Complaint Chief Complaint  Patient presents with  . Cough    appointment    HPI John Huang is a 43 y.o. male.   HPI  61-year-old male presents with cough and congestion that is had for 3 days.  He denies any fever or chills.  He also has complaining of recurring thrush recently.  He explains it as a coating over his throat and tongue.  His tongue is also sore at times.  He does not have diabetes.  Not been taking frequent antibiotics.       Past Medical History:  Diagnosis Date  . Bulging lumbar disc   . CAP (community acquired pneumonia) 02/18/2016  . Cholinergic urticaria   . Epistaxis 05/24/2017  . 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 History of septic arthritis 2000   L knee  . Hypertension     Patient Active Problem List   Diagnosis Date Noted  . OSA (obstructive sleep apnea) 02/28/2018  . 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 Hypertension   . Cholinergic urticaria   . Bulging lumbar disc   . Acute sinusitis 05/24/2017  . Deviated nasal septum 05/24/2017  . Nasal vestibulitis 05/24/2017  . GERD (gastroesophageal reflux disease) 02/18/2016    Past Surgical History:  Procedure Laterality Date  . ANTERIOR CRUCIATE LIGAMENT REPAIR    . MEDIAL COLLATERAL LIGAMENT AND LATERAL COLLATERAL LIGAMENT REPAIR, KNEE    . SHOULDER SURGERY     dislocation and tac left in there, so had to have surgery to removed the tac  . WISDOM TOOTH EXTRACTION         Home Medications    Prior to Admission medications   Medication Sig Start Date End Date Taking? Authorizing Provider  amLODipine (NORVASC) 5 MG tablet TAKE 1 TABLET BY MOUTH DAILY. 05/06/18   Park Liter P, DO  benzonatate (TESSALON) 200 MG capsule Take one cap TID PRN cough 05/19/18   Lorin Picket, PA-C  chlorpheniramine-HYDROcodone [facility]) 10-8 MG/5ML SUER Take  5 mLs by mouth 2 (two) times daily. 05/19/18   Lorin Picket, PA-C  citalopram (CELEXA) 40 MG tablet Take 1 tablet (40 mg total) by mouth daily. 12/14/17 12/14/18  Johnson, Megan P, DO  fexofenadine (ALLEGRA) 180 MG tablet Take 1-2 tablets (180-360 mg total) by mouth daily. 10/21/17   Johnson, Megan P, DO  gabapentin (NEURONTIN) 300 MG capsule Take by mouth. 10/21/17   [provider]  metaxalone (SKELAXIN) 800 MG tablet Take 1 tablet (800 mg total) by mouth 3 (three) times daily. 02/22/18   Lorin Picket, PA-C  Multiple Vitamin (MULTI-VITAMINS) TABS Take by mouth.    [provider]  naproxen (NAPROSYN) 500 MG tablet Take 1 tablet (500 mg total) by mouth 2 (two) times daily with a meal. 02/22/18   Lorin Picket, PA-C  nystatin (MYCOSTATIN) 100000 UNIT/ML suspension Take 5 mLs (500,000 Units total) by mouth 4 (four) times daily. 05/19/18   Lorin Picket, PA-C  oxyCODONE-acetaminophen (PERCOCET) 5-325 MG tablet Take 1 tablet by mouth every 6 (six) hours as needed for severe pain. 05/13/18   Edrick Kins, DPM    Family History Family History  Problem Relation Age of Onset  . Stroke Mother   . Hypertension Mother   . Diabetes Mother   . Cancer Father  Throat  . Cancer Sister        Eye/Brain  . Hypertension Brother   . Irritable bowel syndrome Brother     Social History Social History   Tobacco Use  . Smoking status: Never Smoker  . Smokeless tobacco: Never Used  Substance Use Topics  . Alcohol use: Yes    Comment: occasion  . Drug use: Never     Allergies   Cat hair extract and Galactose   Review of Systems Review of Systems  Constitutional: Positive for activity change. Negative for chills, fatigue and fever.  HENT: Positive for dental problem.   Respiratory: Positive for cough.   All other systems reviewed and are negative.    Physical Exam Triage Vital Signs ED Triage Vitals  Enc Vitals Group     BP 05/19/18 1110 (!) 143/102      Pulse Rate 05/19/18 1110 86     Resp 05/19/18 1110 16     Temp 05/19/18 1110 98.8 F (37.1 C)     Temp Source 05/19/18 1110 Oral     SpO2 05/19/18 1110 98 %     Weight 05/19/18 1111 255 lb (115.7 kg)     Height 05/19/18 1111 6\' 2"  (1.88 m)     Head Circumference --      Peak Flow --      Pain Score 05/19/18 1111 2     Pain Loc --      Pain Edu? --      Excl. in [place]? --    No data found.  Updated Vital Signs BP (!) 152/98 (BP Location: Left Arm)   Pulse 88   Temp 98.8 F (37.1 C) (Oral)   Resp 16   Ht 6\' 2"  (1.88 m)   Wt 255 lb (115.7 kg)   SpO2 98%   BMI 32.74 kg/m   Visual Acuity Right Eye Distance:   Left Eye Distance:   Bilateral Distance:    Right Eye Near:   Left Eye Near:    Bilateral Near:     Physical Exam Vitals signs and nursing note reviewed.  Constitutional:      General: He is not in acute distress.    Appearance: Normal appearance. He is not ill-appearing, toxic-appearing or diaphoretic.  HENT:     Head: Normocephalic.     Right Ear: Tympanic membrane and ear canal normal.     Left Ear: Tympanic membrane and ear canal normal.     Nose: Nose normal.     Comments: She does have a light white coating over the tongue and buccal surface of the cheeks and throat.  He does have areas of increased mucus.    Mouth/Throat:     Mouth: Mucous membranes are moist.     Pharynx: No oropharyngeal exudate or posterior oropharyngeal erythema.  Eyes:     General:        Right eye: No discharge.        Left eye: No discharge.     Conjunctiva/sclera: Conjunctivae normal.     Pupils: Pupils are equal, round, and reactive to light.  Neck:     Musculoskeletal: Normal range of motion and neck supple.  Pulmonary:     Effort: Pulmonary effort is normal.     Breath sounds: Normal breath sounds.  Musculoskeletal: Normal range of motion.  Skin:    General: Skin is warm and dry.  Neurological:     General: No focal deficit present.     Mental  Status: He is alert and  oriented to person, place, and time.  Psychiatric:        Mood and Affect: Mood normal.        Behavior: Behavior normal.        Thought Content: Thought content normal.        Judgment: Judgment normal.      UC Treatments / Results  Labs (all labs ordered are listed, but only abnormal results are displayed) Labs Reviewed - No data to display  EKG None  Radiology No results found.  Procedures Procedures (including critical care time)  Medications Ordered in UC Medications - No data to display  Initial Impression / Assessment and Plan / UC Course  I have reviewed the triage vital signs and the nursing notes.  Pertinent labs & imaging results that were available during my care of the patient were reviewed by me and considered in my medical decision making (see chart for details). We will place the patient on presents with Tussionex and Tessalon Perles.  I told him this is likely viral illness does not require antibiotics at this time.  For the possible thrush I will put him on nystatin swish and swallow.  Is not improving I recommended he follow-up with ear nose and throat.            Final Clinical Impressions(s) / UC Diagnoses   Final diagnoses:  Upper respiratory tract infection, unspecified type  Thrush, oral     Discharge Instructions     Drink plenty of fluids.  Get plenty of rest.   ED Prescriptions    Medication Sig Dispense Auth. Provider   chlorpheniramine-HYDROcodone (TUSSIONEX PENNKINETIC ER) 10-8 MG/5ML SUER Take 5 mLs by mouth 2 (two) times daily. 115 mL Crecencio Mc P, PA-C   benzonatate (TESSALON) 200 MG capsule Take one cap TID PRN cough 30 capsule Crecencio Mc P, PA-C   nystatin (MYCOSTATIN) 100000 UNIT/ML suspension Take 5 mLs (500,000 Units total) by mouth 4 (four) times daily. 60 mL Lorin Picket, PA-C     Controlled Substance Prescriptions [place] Controlled Substance Registry consulted? Not Applicable   Lorin Picket,  PA-C 05/19/18 2214

## 2018-05-19 NOTE — Discharge Instructions (Signed)
Drink plenty of fluids.  Get plenty of rest.

## 2018-05-20 ENCOUNTER — Encounter: Payer: Self-pay | Admitting: Podiatry

## 2018-05-20 ENCOUNTER — Ambulatory Visit (INDEPENDENT_AMBULATORY_CARE_PROVIDER_SITE_OTHER): Payer: 59 | Admitting: Podiatry

## 2018-05-20 ENCOUNTER — Ambulatory Visit (INDEPENDENT_AMBULATORY_CARE_PROVIDER_SITE_OTHER): Payer: 59

## 2018-05-20 VITALS — BP 141/92 | HR 94 | Temp 98.3°F

## 2018-05-20 DIAGNOSIS — M205X1 Other deformities of toe(s) (acquired), right foot: Secondary | ICD-10-CM

## 2018-05-20 DIAGNOSIS — Z9889 Other specified postprocedural states: Secondary | ICD-10-CM | POA: Diagnosis not present

## 2018-05-20 DIAGNOSIS — M205X2 Other deformities of toe(s) (acquired), left foot: Secondary | ICD-10-CM

## 2018-05-22 ENCOUNTER — Encounter: Payer: Self-pay | Admitting: Gynecology

## 2018-05-22 ENCOUNTER — Ambulatory Visit
Admission: EM | Admit: 2018-05-22 | Discharge: 2018-05-22 | Disposition: A | Discharge status: Home / Self Care | Payer: 59 | Attending: Family Medicine | Admitting: Family Medicine

## 2018-05-22 ENCOUNTER — Ambulatory Visit
Admission: RE | Admit: 2018-05-22 | Discharge: 2018-05-22 | Disposition: A | Discharge status: Home / Self Care | Payer: 59 | Source: Ambulatory Visit | Attending: Family Medicine | Admitting: Family Medicine

## 2018-05-22 DIAGNOSIS — M79605 Pain in left leg: Secondary | ICD-10-CM

## 2018-05-22 DIAGNOSIS — Z9889 Other specified postprocedural states: Secondary | ICD-10-CM | POA: Insufficient documentation

## 2018-05-22 DIAGNOSIS — R600 Localized edema: Secondary | ICD-10-CM | POA: Diagnosis not present

## 2018-05-22 IMAGING — US US EXTREM LOW VENOUS*L*
1 series · 13 of 24 positions shown · non-contrast
Comparison: None.

CLINICAL DATA: Left lower extremity pain and edema.



[Series 1: us extrem low venous*left* · 13 of 34 slices shown]
[im 1/34]
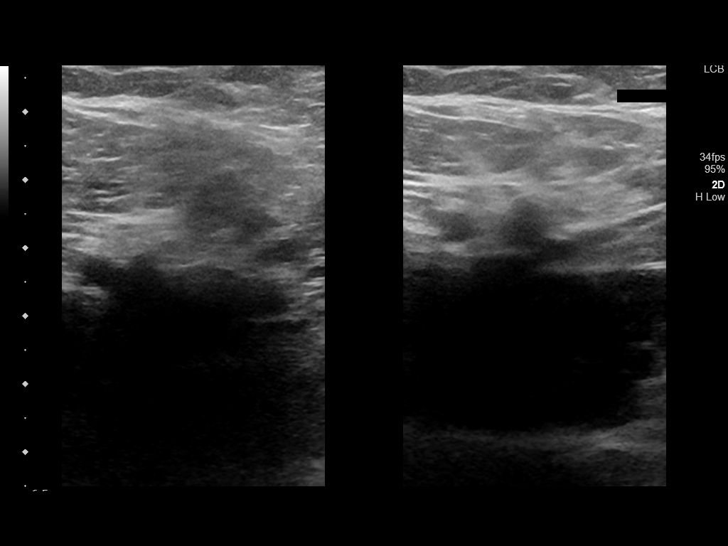
[im 3/34]
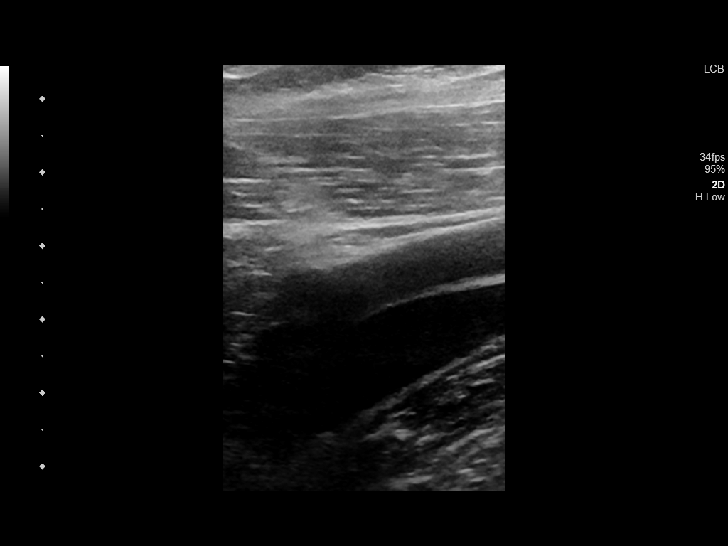
[im 6/34]
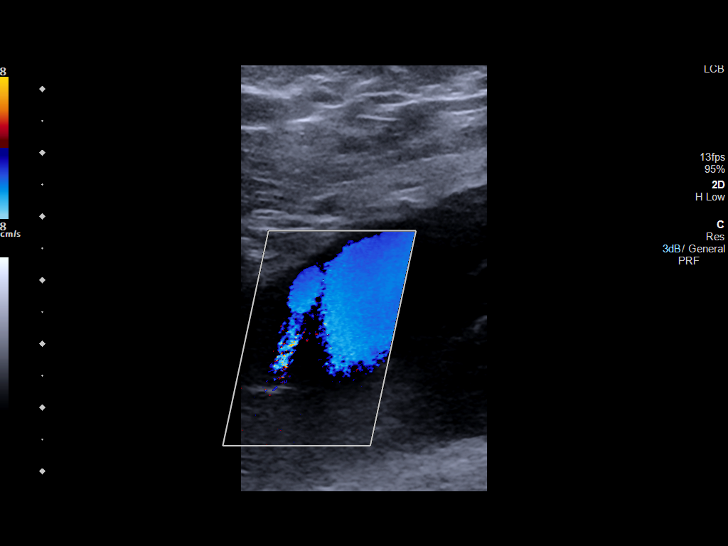
[im 9/34]
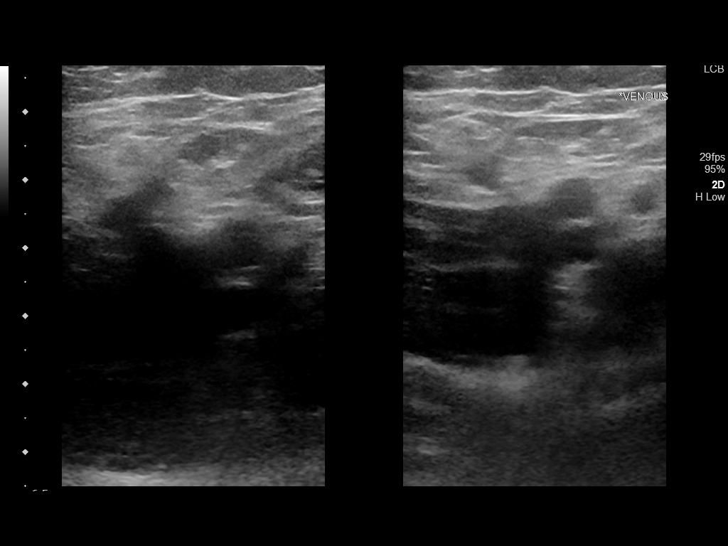
[im 12/34]
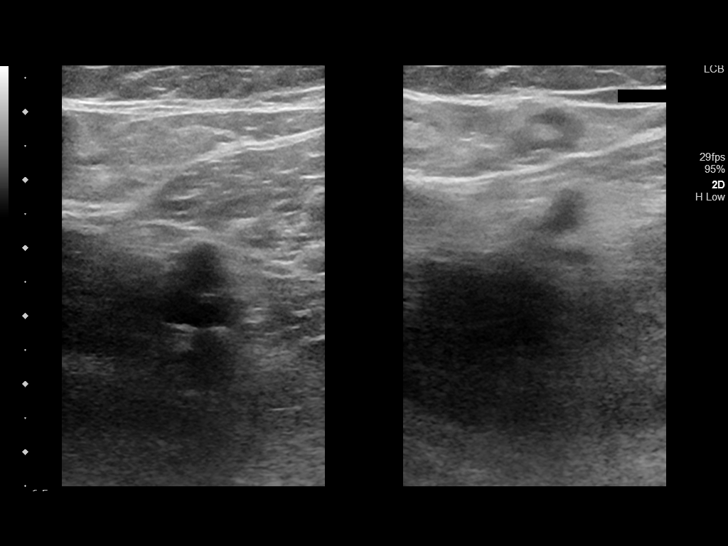
[im 15/34]
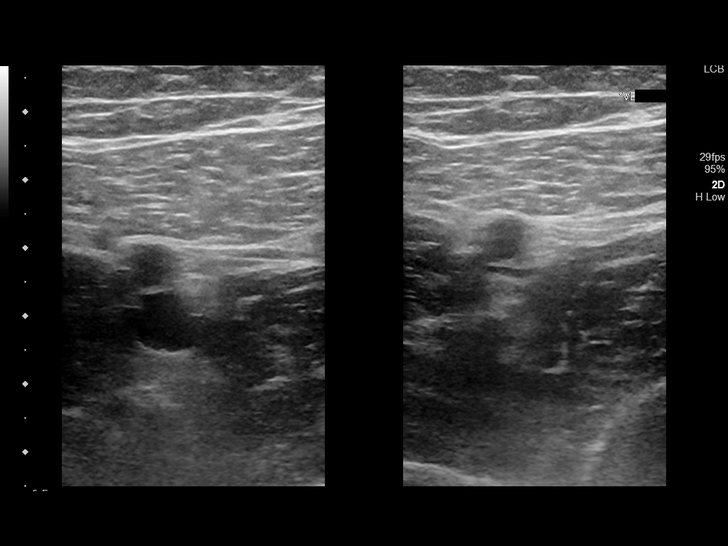
[im 18/34]
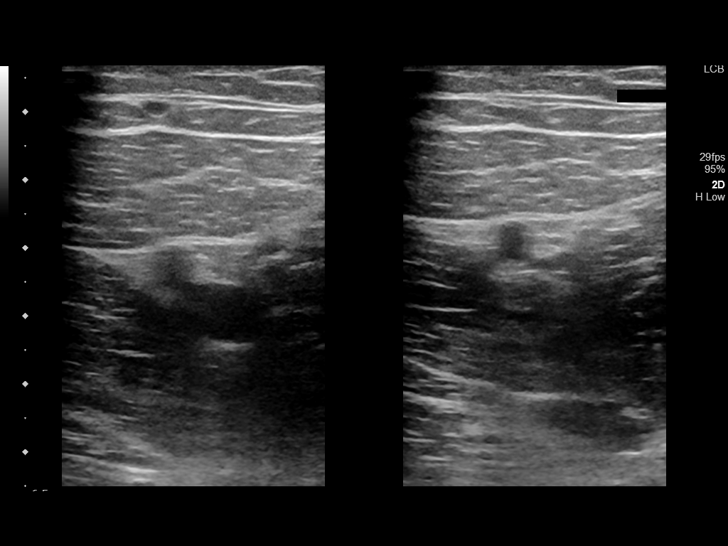
[im 19/34]
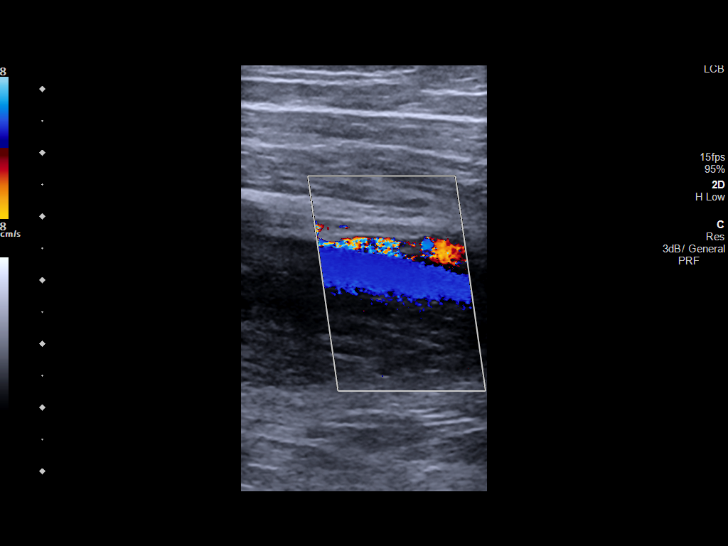
[im 22/34]
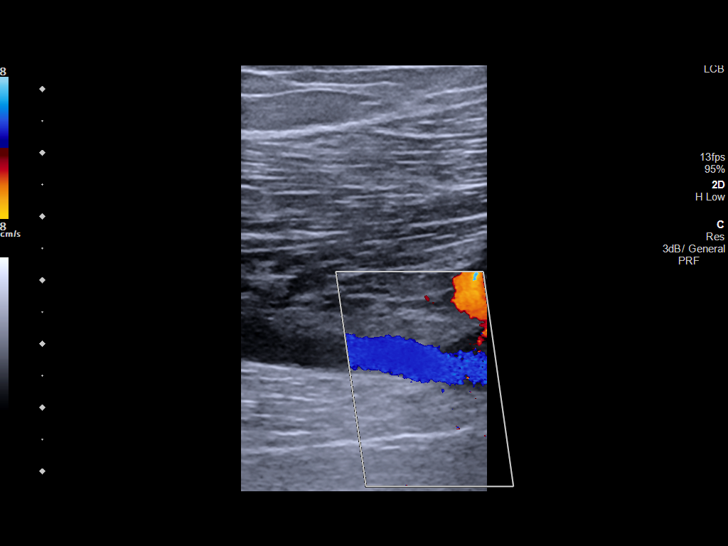
[im 25/34]
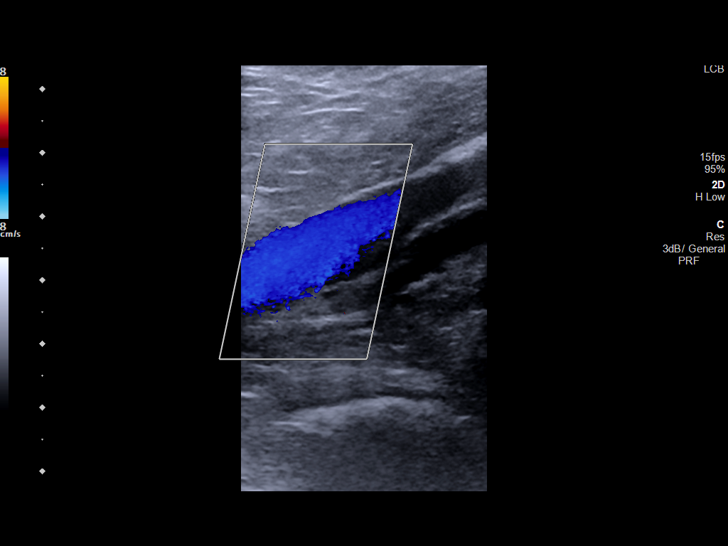
[im 28/34]
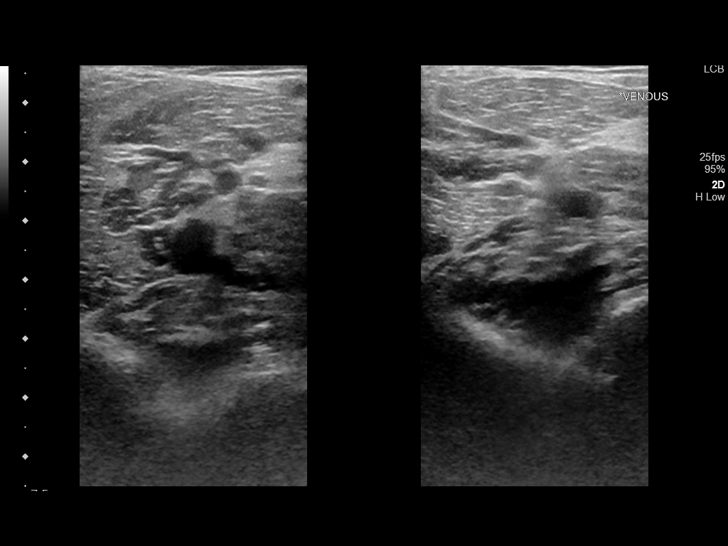
[im 31/34]
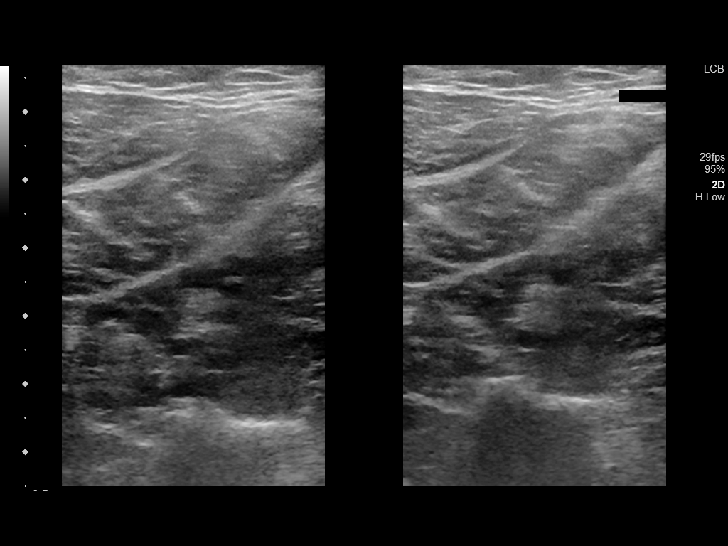
[im 34/34]
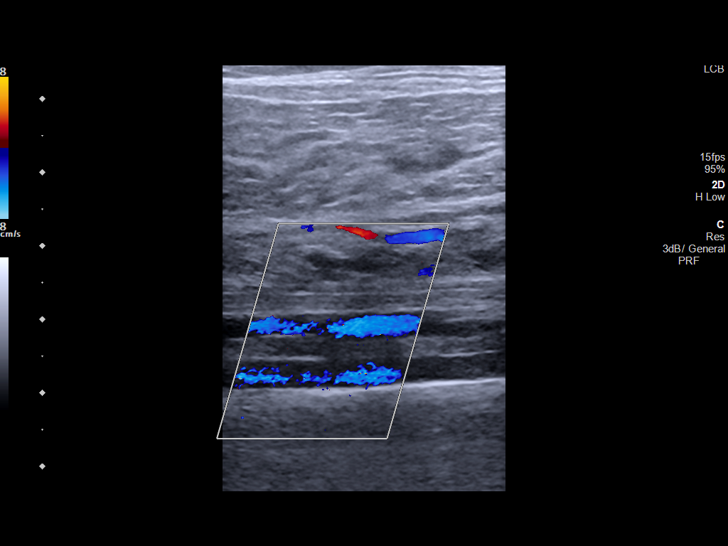

[13 of 24 positions shown; findings below may reference images not displayed]

FINDINGS: Contralateral Common Femoral Vein: Respiratory phasicity is normal
and symmetric with the symptomatic side. No evidence of thrombus.
Normal compressibility.

Common Femoral Vein: No evidence of thrombus. Normal
compressibility, respiratory phasicity and response to augmentation.

Saphenofemoral Junction: No evidence of thrombus. Normal
compressibility and flow on color Doppler imaging.

Profunda Femoral Vein: No evidence of thrombus. Normal
compressibility and flow on color Doppler imaging.

Femoral Vein: No evidence of thrombus. Normal compressibility,
respiratory phasicity and response to augmentation.

Popliteal Vein: No evidence of thrombus. Normal compressibility,
respiratory phasicity and response to augmentation.

Calf Veins: No evidence of thrombus. Normal compressibility and flow
on color Doppler imaging.

Superficial Great Saphenous Vein: No evidence of thrombus. Normal
compressibility.

Venous Reflux:  None.

Other Findings: No evidence of superficial thrombophlebitis or
abnormal fluid collection.
IMPRESSION: No evidence of left lower extremity deep venous thrombosis.

## 2018-05-22 NOTE — ED Provider Notes (Signed)
[facility]    CSN: [id number] Arrival date & time: 05/22/18  0840  History   Chief Complaint Leg pain  HPI   43 year old male presents with the above complaint.  Patient is status post bilateral foot surgery on 12/27.  Patient states that yesterday he developed left calf pain/soreness.  Patient states that the pain has now progressed upward to his left medial thigh.  Very tender.  Patient is concerned about a blood clot.  Reports of shortness of breath.  He is not currently taking anything for the pain other than Tylenol.  Worse with touch and activity.  No relieving factors.  No other complaints.  Past Medical History:  Diagnosis Date  . Bulging lumbar disc   . CAP (community acquired pneumonia) 02/18/2016  . Cholinergic urticaria   . Epistaxis 05/24/2017  . 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 History of septic arthritis 2000   L knee  . Hypertension     Patient Active Problem List   Diagnosis Date Noted  . OSA (obstructive sleep apnea) 02/28/2018  . 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 Hypertension   . Cholinergic urticaria   . Bulging lumbar disc   . Acute sinusitis 05/24/2017  . Deviated nasal septum 05/24/2017  . Nasal vestibulitis 05/24/2017  . GERD (gastroesophageal reflux disease) 02/18/2016    Past Surgical History:  Procedure Laterality Date  . ANTERIOR CRUCIATE LIGAMENT REPAIR    . MEDIAL COLLATERAL LIGAMENT AND LATERAL COLLATERAL LIGAMENT REPAIR, KNEE    . SHOULDER SURGERY     dislocation and tac left in there, so had to have surgery to removed the tac  . WISDOM TOOTH EXTRACTION         Home Medications    Prior to Admission medications   Medication Sig Start Date End Date Taking? Authorizing Provider  amLODipine (NORVASC) 5 MG tablet TAKE 1 TABLET BY MOUTH DAILY. 05/06/18  Yes Johnson, Megan P, DO  benzonatate (TESSALON) 200 MG capsule Take one cap TID PRN cough 05/19/18  Yes Lorin Picket, PA-C    chlorpheniramine-HYDROcodone (TUSSIONEX PENNKINETIC ER) 10-8 MG/5ML SUER Take 5 mLs by mouth 2 (two) times daily. 05/19/18  Yes Lorin Picket, PA-C  citalopram (CELEXA) 40 MG tablet Take 1 tablet (40 mg total) by mouth daily. 12/14/17 12/14/18 Yes Johnson, Megan P, DO  fexofenadine (ALLEGRA) 180 MG tablet Take 1-2 tablets (180-360 mg total) by mouth daily. 10/21/17  Yes Johnson, Megan P, DO  metaxalone (SKELAXIN) 800 MG tablet Take 1 tablet (800 mg total) by mouth 3 (three) times daily. 02/22/18  Yes Lorin Picket, PA-C  Multiple Vitamin (MULTI-VITAMINS) TABS Take by mouth.   Yes [provider]  naproxen (NAPROSYN) 500 MG tablet Take 1 tablet (500 mg total) by mouth 2 (two) times daily with a meal. 02/22/18  Yes Lorin Picket, PA-C  nystatin (MYCOSTATIN) 100000 UNIT/ML suspension Take 5 mLs (500,000 Units total) by mouth 4 (four) times daily. 05/19/18  Yes Lorin Picket, PA-C  gabapentin (NEURONTIN) 300 MG capsule Take by mouth. 10/21/17   [provider]  oxyCODONE-acetaminophen (PERCOCET) 5-325 MG tablet Take 1 tablet by mouth every 6 (six) hours as needed for severe pain. 05/13/18   Edrick Kins, DPM    Family History Family History  Problem Relation Age of Onset  . Stroke Mother   . Hypertension Mother   . Diabetes Mother   . Cancer Father  Throat  . Cancer Sister        Eye/Brain  . Hypertension Brother   . Irritable bowel syndrome Brother     Social History Social History   Tobacco Use  . Smoking status: Never Smoker  . Smokeless tobacco: Never Used  Substance Use Topics  . Alcohol use: Yes    Comment: occasion  . Drug use: Never     Allergies   Cat hair extract and Galactose   Review of Systems Review of Systems  Constitutional: Negative.   Musculoskeletal:       Left leg pain.   Physical Exam Triage Vital Signs ED Triage Vitals  Enc Vitals Group     BP 05/22/18 0852 (!) 143/93     Pulse Rate 05/22/18 0852 80     Resp  05/22/18 0852 18     Temp 05/22/18 0852 98.1 F (36.7 C)     Temp Source 05/22/18 0852 Oral     SpO2 05/22/18 0852 99 %     Weight 05/22/18 0852 255 lb (115.7 kg)     Height --      Head Circumference --      Peak Flow --      Pain Score 05/22/18 0851 8     Pain Loc --      Pain Edu? --      Excl. in [place]? --    Updated Vital Signs BP (!) 143/93 (BP Location: Left Arm)   Pulse 80   Temp 98.1 F (36.7 C) (Oral)   Resp 18   Wt 115.7 kg   SpO2 99%   BMI 32.74 kg/m   Visual Acuity Right Eye Distance:   Left Eye Distance:   Bilateral Distance:    Right Eye Near:   Left Eye Near:    Bilateral Near:     Physical Exam Vitals signs and nursing note reviewed.  Constitutional:      General: He is not in acute distress. HENT:     Head: Normocephalic and atraumatic.  Eyes:     General: No scleral icterus.    Conjunctiva/sclera: Conjunctivae normal.  Cardiovascular:     Rate and Rhythm: Normal rate and regular rhythm.  Pulmonary:     Effort: Pulmonary effort is normal. No respiratory distress.  Musculoskeletal:     Comments: Left leg - No calf tenderness, swelling or erythema. Medial thigh tenderness to palpation.  Neurological:     Mental Status: He is alert.  Psychiatric:        Mood and Affect: Mood normal.        Behavior: Behavior normal.    UC Treatments / Results  Labs (all labs ordered are listed, but only abnormal results are displayed) Labs Reviewed - No data to display  EKG None  Radiology Dg Foot Complete Left  Result Date: 05/20/2018 Please see detailed radiograph report in office note.  Dg Foot Complete Right  Result Date: 05/20/2018 Please see detailed radiograph report in office note.   Procedures Procedures (including critical care time)  Medications Ordered in UC Medications - No data to display  Initial Impression / Assessment and Plan / UC Course  I have reviewed the triage vital signs and the nursing notes.  Pertinent labs &  imaging results that were available during my care of the patient were reviewed by me and considered in my medical decision making (see chart for details).    43 year old male presents with left leg pain.  Clinically I do  not feel patient has a DVT.  Arranging ultrasound to ensure no underlying DVT as patient is quite worried.  Has over-the-counter anti-inflammatories and recent pain medication as needed pain.  Supportive care.  Final Clinical Impressions(s) / UC Diagnoses   Final diagnoses:  Left leg pain     Discharge Instructions     OTC anti-inflammatories as needed.  We will call with the result.  Take care  Dr. Lacinda Axon    ED Prescriptions    None     Controlled Substance Prescriptions [place] Controlled Substance Registry consulted? Not Applicable   Coral Spikes, [place] 05/22/18 7207

## 2018-05-22 NOTE — ED Triage Notes (Signed)
Per patient left calf muscle sore to touch x yesterday. Per patient s/p x 1 week ago with bilateral leg surgery for bone spur.

## 2018-05-22 NOTE — Discharge Instructions (Signed)
OTC anti-inflammatories as needed.  We will call with the result.  Take care  Dr. Lacinda Axon

## 2018-05-23 NOTE — Progress Notes (Signed)
   Subjective:  Patient presents today status post cheilectomy bilateral. DOS: 05/13/18. He states he is doing better. He reports some mild soreness that is tolerable with Tylenol. He reports some mild associated nausea. He has been using the post op shoes as directed. He denies modifying factors. Patient is here for further evaluation and treatment.    Past Medical History:  Diagnosis Date  . Bulging lumbar disc   . CAP (community acquired pneumonia) 02/18/2016  . Cholinergic urticaria   . Epistaxis 05/24/2017  . 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 History of septic arthritis 2000   L knee  . Hypertension       Objective/Physical Exam Neurovascular status intact.  Skin incisions appear to be well coapted with sutures and staples intact. No sign of infectious process noted. No dehiscence. No active bleeding noted. Moderate edema noted to the surgical extremity.  Radiographic Exam:  Osteotomies sites appear to be stable with routine healing.  Assessment: 1. s/p cheilectomy bilateral. DOS: 05/03/18   Plan of Care:  1. Patient was evaluated. X-rays reviewed 2. Dressing changed. Keep clean, dry and intact for one week.  3. Continue using post op shoes.  4. Return to clinic in one week for suture removal.    Edrick Kins, DPM [facility]  Dr. Edrick Kins, [address]                                        [place], [place] [address]                Office [phone]  Fax [phone]

## 2018-05-31 ENCOUNTER — Encounter: Payer: Self-pay | Admitting: Podiatry

## 2018-05-31 ENCOUNTER — Ambulatory Visit (INDEPENDENT_AMBULATORY_CARE_PROVIDER_SITE_OTHER): Payer: 59 | Admitting: Podiatry

## 2018-05-31 ENCOUNTER — Encounter: Payer: 59 | Admitting: Podiatry

## 2018-05-31 DIAGNOSIS — M205X2 Other deformities of toe(s) (acquired), left foot: Secondary | ICD-10-CM

## 2018-05-31 DIAGNOSIS — Z9889 Other specified postprocedural states: Secondary | ICD-10-CM

## 2018-05-31 DIAGNOSIS — M205X1 Other deformities of toe(s) (acquired), right foot: Secondary | ICD-10-CM

## 2018-06-01 NOTE — Progress Notes (Signed)
   Subjective:  Patient presents today status post cheilectomy bilateral. DOS: 05/13/18. He reports sharp pain to the medial aspects of the great toes at night. He denies modifying factors. He has been using the post op shoes as directed. Patient is here for further evaluation and treatment.    Past Medical History:  Diagnosis Date  . Bulging lumbar disc   . CAP (community acquired pneumonia) 02/18/2016  . Cholinergic urticaria   . Epistaxis 05/24/2017  . 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 History of septic arthritis 2000   L knee  . Hypertension       Objective/Physical Exam Neurovascular status intact.  Skin incisions appear to be well coapted with sutures and staples intact. No sign of infectious process noted. No dehiscence. No active bleeding noted. Moderate edema noted to the surgical extremity.  Assessment: 1. s/p cheilectomy bilateral. DOS: 05/03/18   Plan of Care:  1. Patient was evaluated. 2. Sutures removed.  3. Compression anklet dispensed.  4. Transition out of post op shoes.  5. Return to clinic in 4 weeks.    Edrick Kins, DPM [facility]  Dr. Edrick Kins, [address]                                        [place], [place] [address]                Office [phone]  Fax [phone]

## 2018-06-22 DIAGNOSIS — L508 Other urticaria: Secondary | ICD-10-CM | POA: Diagnosis not present

## 2018-06-28 ENCOUNTER — Encounter: Payer: 59 | Admitting: Podiatry

## 2018-07-05 ENCOUNTER — Encounter: Payer: Self-pay | Admitting: Podiatry

## 2018-07-05 ENCOUNTER — Ambulatory Visit (INDEPENDENT_AMBULATORY_CARE_PROVIDER_SITE_OTHER): Payer: 59 | Admitting: Podiatry

## 2018-07-05 ENCOUNTER — Ambulatory Visit (INDEPENDENT_AMBULATORY_CARE_PROVIDER_SITE_OTHER): Payer: 59

## 2018-07-05 DIAGNOSIS — M205X2 Other deformities of toe(s) (acquired), left foot: Secondary | ICD-10-CM | POA: Diagnosis not present

## 2018-07-05 DIAGNOSIS — M205X1 Other deformities of toe(s) (acquired), right foot: Secondary | ICD-10-CM | POA: Diagnosis not present

## 2018-07-07 NOTE — Progress Notes (Signed)
   Subjective:  Patient presents today status post cheilectomy bilateral. DOS: 05/13/18. He states he is doing well overall. He reports feeling like he is walking on a stone or has bruises on the feet. He reports some associated intermittent shooting pain. He notes the swelling has decreased in the feet. He has been using the compression anklets as directed. Patient is here for further evaluation and treatment.   Past Medical History:  Diagnosis Date  . Bulging lumbar disc   . CAP (community acquired pneumonia) 02/18/2016  . Cholinergic urticaria   . Epistaxis 05/24/2017  . 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 History of septic arthritis 2000   L knee  . Hypertension       Objective/Physical Exam Neurovascular status intact.  Skin incisions appear to be well coapted. No sign of infectious process noted. No dehiscence. No active bleeding noted. Moderate edema noted to the surgical extremity.  Assessment: 1. s/p cheilectomy bilateral. DOS: 05/03/18   Plan of Care:  1. Patient was evaluated. X-Rays reviewed.  2. Recommended good shoe gear.  3. May resume full activity with no restrictions.  4. Return to clinic as needed.    Expecting a baby any day.    Edrick Kins, DPM [facility]  Dr. Edrick Kins, [address]                                        [place], [place] [address]                Office [phone]  Fax [phone]

## 2018-07-13 DIAGNOSIS — L501 Idiopathic urticaria: Secondary | ICD-10-CM | POA: Diagnosis not present

## 2018-07-22 ENCOUNTER — Other Ambulatory Visit: Payer: Self-pay | Admitting: Family Medicine

## 2018-08-11 DIAGNOSIS — L501 Idiopathic urticaria: Secondary | ICD-10-CM | POA: Diagnosis not present

## 2018-09-22 ENCOUNTER — Encounter: Payer: Self-pay | Admitting: Family Medicine

## 2018-10-10 ENCOUNTER — Encounter: Payer: Self-pay | Admitting: Family Medicine

## 2018-10-13 ENCOUNTER — Telehealth: Payer: Self-pay | Admitting: Family Medicine

## 2018-10-13 NOTE — Telephone Encounter (Signed)
Copied from [place] [id number]. Topic: General - Inquiry >> Oct 13, 2018  4:50 PM Mathis Bud wrote: Reason for CRM: Patient called stating he cannot get in touch with [facility] doctor.  Patient is stating his back is hurting. Patient is requesting muscle relaxer's from PCP. Call back # [phone]

## 2018-10-13 NOTE — Telephone Encounter (Signed)
Please schedule appointment.

## 2018-10-17 ENCOUNTER — Encounter: Payer: Self-pay | Admitting: Family Medicine

## 2018-10-17 ENCOUNTER — Other Ambulatory Visit: Payer: Self-pay

## 2018-10-17 ENCOUNTER — Ambulatory Visit (INDEPENDENT_AMBULATORY_CARE_PROVIDER_SITE_OTHER): Payer: 59 | Admitting: Family Medicine

## 2018-10-17 VITALS — BP 147/94 | HR 72 | Temp 98.9°F

## 2018-10-17 DIAGNOSIS — M5441 Lumbago with sciatica, right side: Secondary | ICD-10-CM | POA: Diagnosis not present

## 2018-10-17 DIAGNOSIS — I10 Essential (primary) hypertension: Secondary | ICD-10-CM

## 2018-10-17 DIAGNOSIS — F419 Anxiety disorder, unspecified: Secondary | ICD-10-CM

## 2018-10-17 MED ORDER — PREDNISONE 10 MG PO TABS: ORAL_TABLET | ORAL | 0 refills | Status: DC

## 2018-10-17 MED ORDER — METAXALONE 800 MG PO TABS: 800.0000 mg | ORAL_TABLET | Freq: Three times a day (TID) | ORAL | 3 refills | Status: DC

## 2018-10-17 MED ORDER — CITALOPRAM HYDROBROMIDE 40 MG PO TABS: 40.0000 mg | ORAL_TABLET | Freq: Every day | ORAL | 1 refills | Status: DC

## 2018-10-17 MED ORDER — NAPROXEN 500 MG PO TABS: 500.0000 mg | ORAL_TABLET | Freq: Two times a day (BID) | ORAL | 6 refills | Status: DC

## 2018-10-17 MED ORDER — AMLODIPINE BESYLATE 5 MG PO TABS: 5.0000 mg | ORAL_TABLET | Freq: Every day | ORAL | 1 refills | Status: DC

## 2018-10-17 NOTE — Assessment & Plan Note (Signed)
Slightly elevated today due to pain. Continue current regimen. Labs checked today. Call with any concerns.

## 2018-10-17 NOTE — Progress Notes (Signed)
BP (!) 147/94   Pulse 72   Temp 98.9 F (37.2 C) (Oral)   SpO2 96%    Subjective:    Patient ID: John Huang, male    DOB: 24-Jun-1975, 43 y.o.   MRN: [id number]  HPI: John Huang is a 43 y.o. male  Chief Complaint  Patient presents with  . Pain    lower back for 9 days, bilateral shoulder for a couple of days   BACK PAIN- was debilitated with pain in his back back in 2018, did PT at that time and recovered, about 9-10 days ago, started with a twinge in his back and then started with more pain. He reached out to his PM&R doctor, but hadn't hear anything back from them. He has been in significant pain.  Duration: 10 days Mechanism of injury: working in the yard, no lifting, standing only Location: R>L, bilateral and low back Onset: sudden Severity: severe Quality: sharp pain Frequency: waxing and waning Radiation: into his quads and his shoulders bilaterally Aggravating factors: laying on his stomach, bending, moving Alleviating factors: dry needling (wife is PT), heating pad, exercises   Status: stable Treatments attempted: dry needling (wife is PT), heating pad, exercises, brace   Relief with NSAIDs?: No NSAIDs Taken Nighttime pain:  no Paresthesias / decreased sensation:  no Bowel / bladder incontinence:  no Fevers:  no Dysuria / urinary frequency:  no  HYPERTENSION Hypertension status: controlled  Satisfied with current treatment? yes Duration of hypertension: chronic BP monitoring frequency:  not checking BP medication side effects:  no Medication compliance: excellent compliance Previous BP meds: amlodipine Aspirin: no Recurrent headaches: no Visual changes: no Palpitations: no Dyspnea: no Chest pain: no Lower extremity edema: no Dizzy/lightheaded: no  ANXIETY/STRESS Duration:controlled Anxious mood: no  Excessive worrying: no Irritability: no  Sweating: no Nausea: no Palpitations:no Hyperventilation: no Panic attacks: no Agoraphobia: no   Obscessions/compulsions: no Depressed mood: no Depression screen [facility] 2/9 12/14/2017 10/21/2017  Decreased Interest 0 0  Down, Depressed, Hopeless 0 0  PHQ - 2 Score 0 0  Altered sleeping 3 0  Tired, decreased energy 3 0  Change in appetite 3 3  Feeling bad or failure about yourself  1 0  Trouble concentrating 1 0  Moving slowly or fidgety/restless 1 0  Suicidal thoughts 0 0  PHQ-9 Score 12 3  Difficult doing work/chores Somewhat difficult Not difficult at all   Anhedonia: no Weight changes: no Insomnia: no   Hypersomnia: no Fatigue/loss of energy: no Feelings of worthlessness: no Feelings of guilt: no Impaired concentration/indecisiveness: no Suicidal ideations: no  Crying spells: no Recent Stressors/Life Changes: yes   Relationship problems: no   Family stress: no     Financial stress: yes    Job stress: yes    Recent death/loss: no  Relevant past medical, surgical, family and social history reviewed and updated as indicated. Interim medical history since our last visit reviewed. Allergies and medications reviewed and updated.  Review of Systems  Constitutional: Negative.   Respiratory: Negative.   Cardiovascular: Negative.   Gastrointestinal: Negative.   Musculoskeletal: Positive for back pain and myalgias. Negative for arthralgias, gait problem, joint swelling, neck pain and neck stiffness.  Skin: Negative.   Neurological: Positive for weakness. Negative for dizziness, tremors, seizures, syncope, facial asymmetry, speech difficulty, light-headedness, numbness and headaches.  Psychiatric/Behavioral: Negative.     Per HPI unless specifically indicated above     Objective:    BP (!) 147/94   Pulse  72   Temp 98.9 F (37.2 C) (Oral)   SpO2 96%   Wt Readings from Last 3 Encounters:  05/22/18 255 lb (115.7 kg)  05/19/18 255 lb (115.7 kg)  03/01/18 247 lb 4.8 oz (112.2 kg)    Physical Exam Vitals signs and nursing note reviewed.  Constitutional:       General: He is not in acute distress.    Appearance: Normal appearance. He is not ill-appearing, toxic-appearing or diaphoretic.  HENT:     Head: Normocephalic and atraumatic.     Right Ear: External ear normal.     Left Ear: External ear normal.     Nose: Nose normal.     Mouth/Throat:     Mouth: Mucous membranes are moist.     Pharynx: Oropharynx is clear.  Eyes:     General: No scleral icterus.       Right eye: No discharge.        Left eye: No discharge.     Extraocular Movements: Extraocular movements intact.     Conjunctiva/sclera: Conjunctivae normal.     Pupils: Pupils are equal, round, and reactive to light.  Neck:     Musculoskeletal: Normal range of motion and neck supple.  Cardiovascular:     Rate and Rhythm: Normal rate and regular rhythm.     Pulses: Normal pulses.     Heart sounds: Normal heart sounds. No murmur. No friction rub. No gallop.   Pulmonary:     Effort: Pulmonary effort is normal. No respiratory distress.     Breath sounds: Normal breath sounds. No stridor. No wheezing, rhonchi or rales.  Chest:     Chest wall: No tenderness.  Musculoskeletal: Normal range of motion.  Skin:    General: Skin is warm and dry.     Capillary Refill: Capillary refill takes less than 2 seconds.     Coloration: Skin is not jaundiced or pale.     Findings: No bruising, erythema, lesion or rash.  Neurological:     General: No focal deficit present.     Mental Status: He is alert and oriented to person, place, and time. Mental status is at baseline.  Psychiatric:        Mood and Affect: Mood normal.        Behavior: Behavior normal.        Thought Content: Thought content normal.        Judgment: Judgment normal.   Back Exam:    Inspection:  Normal spinal curvature.  No deformity, ecchymosis, erythema, or lesions     Palpation:     Midline spinal tenderness: no      Paralumbar tenderness: yes bilateral     Parathoracic tenderness: no      Buttocks tenderness: yesR>L      Range of Motion:      Flexion: Fingers to Knees     Extension:Decreased     Lateral bending:Decreased    Rotation:Decreased    Neuro Exam:Lower extremity DTRs normal & symmetric.  Strength and sensation intact.    Special Tests:      Straight leg raise:negative    Results for orders placed or performed during the hospital encounter of 02/22/18  Urinalysis, Complete w Microscopic  Result Value Ref Range   Color, Urine YELLOW YELLOW   APPearance CLEAR CLEAR   Specific Gravity, Urine 1.015 1.005 - 1.030   pH 5.5 5.0 - 8.0   Glucose, UA NEGATIVE NEGATIVE mg/dL   Hgb urine dipstick NEGATIVE  NEGATIVE   Bilirubin Urine NEGATIVE NEGATIVE   Ketones, ur NEGATIVE NEGATIVE mg/dL   Protein, ur NEGATIVE NEGATIVE mg/dL   Nitrite NEGATIVE NEGATIVE   Leukocytes, UA NEGATIVE NEGATIVE   Squamous Epithelial / LPF 0-5 0 - 5   WBC, UA 0-5 0 - 5 WBC/hpf   RBC / HPF NONE SEEN 0 - 5 RBC/hpf   Bacteria, UA NONE SEEN NONE SEEN   Mucus PRESENT       Assessment & Plan:   Problem List Items Addressed This Visit      Cardiovascular and Mediastinum   Hypertension    Slightly elevated today due to pain. Continue current regimen. Labs checked today. Call with any concerns.       Relevant Medications   amLODipine (NORVASC) 5 MG tablet   Other Relevant Orders   Basic metabolic panel     Other   Anxiety    Under good control on current regimen. Continue current regimen. Continue to monitor. Call with any concerns. Refills given.        Relevant Medications   citalopram (CELEXA) 40 MG tablet    Other Visit Diagnoses    Acute bilateral low back pain with right-sided sciatica    -  Primary   In acute exacerbation. Seeing PM&R Thursday. Will treat with prednisone, robaxin and naproxen when done with the prednisone. Continue stretches. Call w/ concern   Relevant Medications   predniSONE (DELTASONE) 10 MG tablet   metaxalone (SKELAXIN) 800 MG tablet   naproxen (NAPROSYN) 500 MG tablet        Follow up plan: Return in about 6 months (around 04/18/2019) for physical.

## 2018-10-17 NOTE — Assessment & Plan Note (Signed)
Under good control on current regimen. Continue current regimen. Continue to monitor. Call with any concerns. Refills given.   

## 2018-10-18 DIAGNOSIS — L501 Idiopathic urticaria: Secondary | ICD-10-CM | POA: Diagnosis not present

## 2018-10-18 LAB — BASIC METABOLIC PANEL
BUN/Creatinine Ratio: 12 (ref 9–20)
BUN: 12 mg/dL (ref 6–24)
CO2: 24 mmol/L (ref 20–29)
Calcium: 9.4 mg/dL (ref 8.7–10.2)
Chloride: 103 mmol/L (ref 96–106)
Creatinine, Ser: 0.99 mg/dL (ref 0.76–1.27)
GFR calc Af Amer: 107 mL/min/{1.73_m2} (ref >59)
GFR calc non Af Amer: 93 mL/min/{1.73_m2} (ref >59)
Glucose: 84 mg/dL (ref 65–99)
Potassium: 4.3 mmol/L (ref 3.5–5.2)
Sodium: 143 mmol/L (ref 134–144)

## 2018-10-20 DIAGNOSIS — M5416 Radiculopathy, lumbar region: Secondary | ICD-10-CM | POA: Diagnosis not present

## 2018-10-24 ENCOUNTER — Encounter: Payer: Self-pay | Admitting: Podiatry

## 2018-10-27 ENCOUNTER — Telehealth: Payer: Self-pay | Admitting: *Deleted

## 2018-10-27 NOTE — Telephone Encounter (Signed)
I called pt and asked about the pain. Pt states it is a different pain than before the surgery. I told pt then we needed to get him in to be evaluated. I told pt that when he was having a lot of pain he could go back in to the surgery shoe, and allow the areas to rest. Pt states understanding. I transferred to schedulers.

## 2018-10-27 NOTE — Telephone Encounter (Signed)
Pt states he had B/L foot surgery 04/2018 and was still having significant pain was that normal.

## 2018-11-09 ENCOUNTER — Ambulatory Visit (INDEPENDENT_AMBULATORY_CARE_PROVIDER_SITE_OTHER): Payer: 59 | Admitting: Podiatry

## 2018-11-09 ENCOUNTER — Other Ambulatory Visit: Payer: Self-pay | Admitting: Podiatry

## 2018-11-09 ENCOUNTER — Ambulatory Visit (INDEPENDENT_AMBULATORY_CARE_PROVIDER_SITE_OTHER): Payer: 59

## 2018-11-09 ENCOUNTER — Encounter: Payer: Self-pay | Admitting: Podiatry

## 2018-11-09 ENCOUNTER — Other Ambulatory Visit: Payer: Self-pay

## 2018-11-09 VITALS — Temp 98.3°F

## 2018-11-09 DIAGNOSIS — M7752 Other enthesopathy of left foot: Secondary | ICD-10-CM | POA: Diagnosis not present

## 2018-11-09 DIAGNOSIS — M2580 Other specified joint disorders, unspecified joint: Secondary | ICD-10-CM | POA: Diagnosis not present

## 2018-11-09 DIAGNOSIS — M722 Plantar fascial fibromatosis: Secondary | ICD-10-CM

## 2018-11-09 DIAGNOSIS — M7751 Other enthesopathy of right foot: Secondary | ICD-10-CM

## 2018-11-09 DIAGNOSIS — M79671 Pain in right foot: Secondary | ICD-10-CM

## 2018-11-14 NOTE — Progress Notes (Signed)
   HPI: 43 year old male presenting today with a chief complaint of constant aching pain noted to the ball of the bilateral feet that began a few months ago. He states the pain has gotten gradually worse. He denies any modifying factors and states the pain is constant, even when resting the feet. He has been taking Meloxicam for treatment of back pain. Patient is here for further evaluation and treatment.   Past Medical History:  Diagnosis Date  . Bulging lumbar disc   . CAP (community acquired pneumonia) 02/18/2016  . Cholinergic urticaria   . Epistaxis 05/24/2017  . 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 History of septic arthritis 2000   L knee  . Hypertension      Physical Exam: General: The patient is alert and oriented x3 in no acute distress.  Dermatology: Skin is warm, dry and supple bilateral lower extremities. Negative for open lesions or macerations.  Vascular: Palpable pedal pulses bilaterally. No edema or erythema noted. Capillary refill within normal limits.  Neurological: Epicritic and protective threshold grossly intact bilaterally.   Musculoskeletal Exam: Pain with palpation to the bilateral sesamoid apparatus and to the 1st MPJ of the bilateral feet. Range of motion within normal limits to all pedal and ankle joints bilateral. Muscle strength 5/5 in all groups bilateral.   Radiographic Exam:  Possible tibial sesamoid fracture of the right foot.     Assessment: 1. Sesamoiditis bilateral  2. H/o cheilectomy bilateral  3. 1st MPJ capsulitis bilateral    Plan of Care:  1. Patient evaluated. X-Rays reviewed.  2. Injection of 0.5 mLs Celestone Soluspan injected into the 1st MPJ bilaterally.  3. Resume using post op shoe on right foot.  4. Return to clinic in 4 weeks.       Edrick Kins, DPM [facility]  Dr. Edrick Kins, DPM    [address], [place] [address]                Office [phone]  Fax [phone]

## 2018-11-15 DIAGNOSIS — L501 Idiopathic urticaria: Secondary | ICD-10-CM | POA: Diagnosis not present

## 2018-11-16 DIAGNOSIS — M5416 Radiculopathy, lumbar region: Secondary | ICD-10-CM | POA: Diagnosis not present

## 2018-11-29 ENCOUNTER — Other Ambulatory Visit: Payer: Self-pay

## 2018-11-29 ENCOUNTER — Encounter: Payer: Self-pay | Admitting: Podiatry

## 2018-11-29 ENCOUNTER — Encounter: Payer: Self-pay | Admitting: Family Medicine

## 2018-11-29 ENCOUNTER — Ambulatory Visit (INDEPENDENT_AMBULATORY_CARE_PROVIDER_SITE_OTHER): Payer: 59 | Admitting: Podiatry

## 2018-11-29 DIAGNOSIS — R0683 Snoring: Secondary | ICD-10-CM

## 2018-11-29 DIAGNOSIS — M2580 Other specified joint disorders, unspecified joint: Secondary | ICD-10-CM | POA: Diagnosis not present

## 2018-11-30 ENCOUNTER — Ambulatory Visit: Payer: 59 | Admitting: Podiatry

## 2018-12-02 ENCOUNTER — Telehealth: Payer: Self-pay | Admitting: *Deleted

## 2018-12-02 NOTE — Progress Notes (Signed)
   HPI: 43 year old male presenting today for follow up evaluation of bilateral foot pain. He states the pain is relatively unchanged. He states the injections only provided relief for about two days. He has been taking Meloxicam with no significant relief. There are no modifying factors noted. Patient is here for further evaluation and treatment.   Past Medical History:  Diagnosis Date  . Bulging lumbar disc   . CAP (community acquired pneumonia) 02/18/2016  . Cholinergic urticaria   . Epistaxis 05/24/2017  . 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 History of septic arthritis 2000   L knee  . Hypertension      Physical Exam: General: The patient is alert and oriented x3 in no acute distress.  Dermatology: Skin is warm, dry and supple bilateral lower extremities. Negative for open lesions or macerations.  Vascular: Palpable pedal pulses bilaterally. No edema or erythema noted. Capillary refill within normal limits.  Neurological: Epicritic and protective threshold grossly intact bilaterally.   Musculoskeletal Exam: Pain with palpation to the bilateral sesamoid apparatus. Range of motion within normal limits to all pedal and ankle joints bilateral. Muscle strength 5/5 in all groups bilateral.   Assessment: 1. Sesamoiditis bilateral right greater than left - unchanged  2. H/o cheilectomy bilateral   Plan of Care:  1. Patient evaluated.  2. Order for an MRI of the right forefoot placed.  3. CAM boot dispensed. Discontinue using post op shoes bilaterally.  4. Return to clinic in 4 weeks to review MRI results.       Edrick Kins, DPM [facility]  Dr. Edrick Kins, DPM    [address],  [address]                Office [phone]  Fax [phone]

## 2018-12-02 NOTE — Telephone Encounter (Signed)
Pt states he was seen 11/30/2018 and was to have an MRI but has not received a call.

## 2018-12-07 ENCOUNTER — Telehealth: Payer: Self-pay

## 2018-12-07 DIAGNOSIS — M2580 Other specified joint disorders, unspecified joint: Secondary | ICD-10-CM

## 2018-12-07 NOTE — Telephone Encounter (Signed)
-----   Message from Edrick Kins, DPM sent at 11/29/2018  4:26 PM EDT ----- Regarding: MRI RT forefoot Please order MRI RT forefoot.   Dx: fibular sesmoid fracture RT  Thanks, Dr. Amalia Hailey

## 2018-12-07 NOTE — Telephone Encounter (Signed)
Patient is calling to check on status of MRI order and needs to know who to contact to schedule.

## 2018-12-07 NOTE — Telephone Encounter (Signed)
Per Judson Roch with [facility], no precert required if attending a [facility] facility.    Patient notified via voice mail to call and schedule own appt to his convenience.

## 2018-12-07 NOTE — Telephone Encounter (Signed)
Patient notified via voice mail no precert required for MRI and to call and schedule own appt to his convenience.

## 2018-12-07 NOTE — Addendum Note (Signed)
Addended by: Graceann Congress D on: 12/07/2018 12:03 PM   Modules accepted: Orders

## 2018-12-07 NOTE — Telephone Encounter (Signed)
I called patient back again and left another detailed message to call [facility] scheduling department and set up an appt at [facility] to his convenience.

## 2018-12-13 DIAGNOSIS — L501 Idiopathic urticaria: Secondary | ICD-10-CM | POA: Diagnosis not present

## 2018-12-14 DIAGNOSIS — L501 Idiopathic urticaria: Secondary | ICD-10-CM | POA: Diagnosis not present

## 2018-12-14 DIAGNOSIS — B370 Candidal stomatitis: Secondary | ICD-10-CM | POA: Diagnosis not present

## 2018-12-21 ENCOUNTER — Ambulatory Visit
Admission: RE | Admit: 2018-12-21 | Discharge: 2018-12-21 | Disposition: A | Discharge status: Home / Self Care | Payer: 59 | Source: Ambulatory Visit | Attending: Podiatry | Admitting: Podiatry

## 2018-12-21 ENCOUNTER — Other Ambulatory Visit: Payer: Self-pay

## 2018-12-21 DIAGNOSIS — M2580 Other specified joint disorders, unspecified joint: Secondary | ICD-10-CM | POA: Insufficient documentation

## 2018-12-21 DIAGNOSIS — M19071 Primary osteoarthritis, right ankle and foot: Secondary | ICD-10-CM | POA: Diagnosis not present

## 2018-12-21 IMAGING — MR MRI OF THE RIGHT FOREFOOT WITHOUT CONTRAST
5 series · 40 of 40 positions shown · non-contrast
Comparison: None.

CLINICAL DATA: Evaluate for fibular sesamoid fracture, right foot
pain

EXAM:
MRI OF THE RIGHT FOREFOOT WITHOUT CONTRAST
TECHNIQUE: Multiplanar, multisequence MR imaging of the right forefoot was
performed. No intravenous contrast was administered.

[Series 4: T1 · coronal · right · 3.0mm · 0.38mm/px · 11 of 45 slices shown (1 of 2)]
[im 1/45]
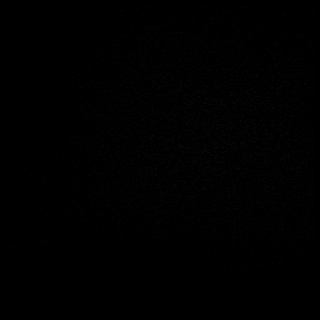
[im 5/45]
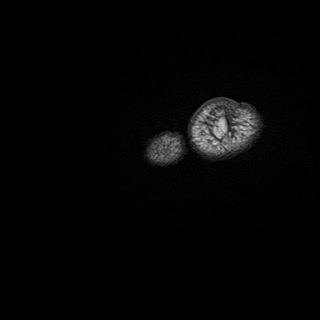
[im 9/45]
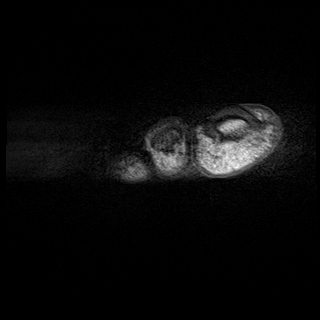
[im 14/45]
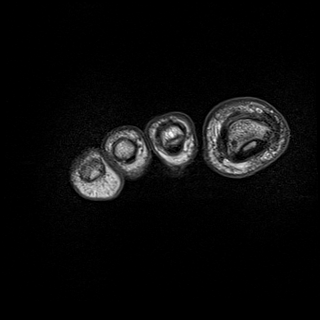
[im 18/45]
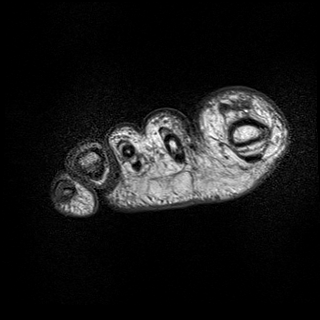
[im 23/45]
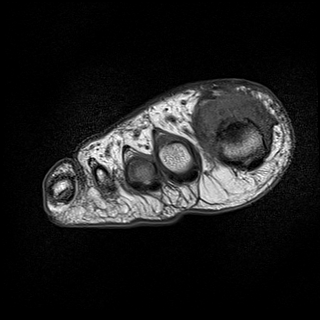
[im 27/45]
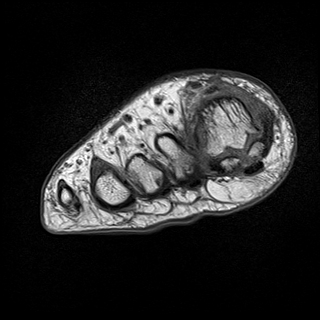
[im 31/45]
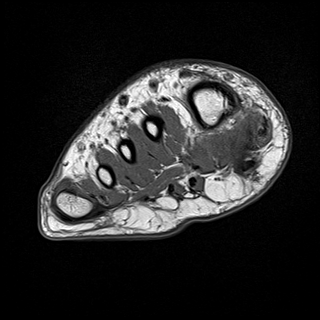
[im 36/45]
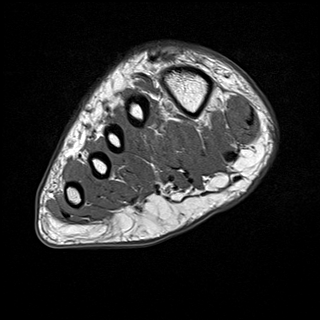
[im 40/45]
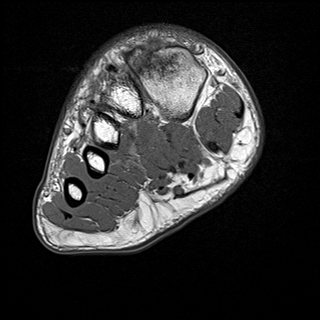
[im 45/45]
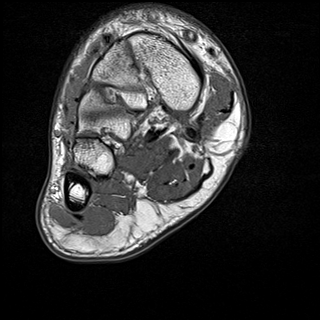

[Series 6: T2 · coronal · right · 3.0mm · 0.38mm/px · 11 of 45 slices shown (1 of 2)]
[im 1/45]
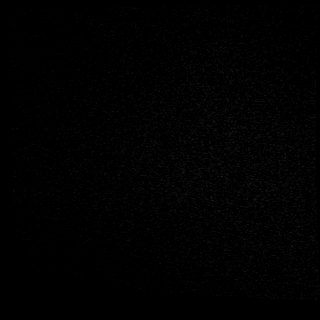
[im 5/45]
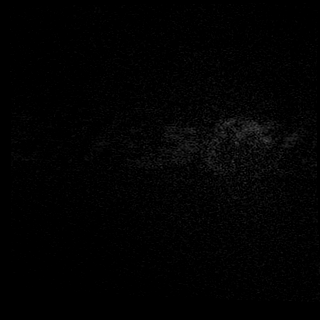
[im 9/45]
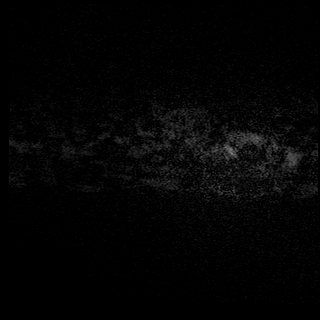
[im 14/45]
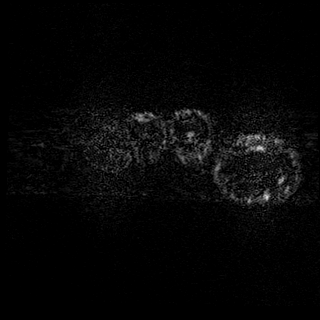
[im 18/45]
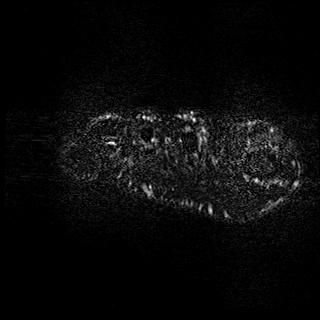
[im 23/45]
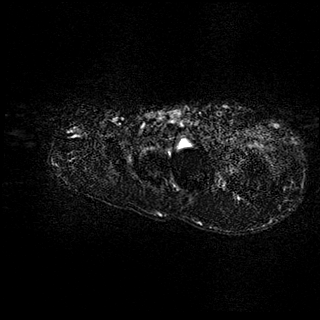
[im 27/45]
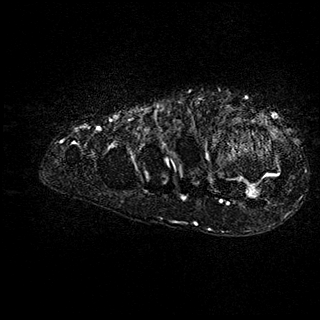
[im 31/45]
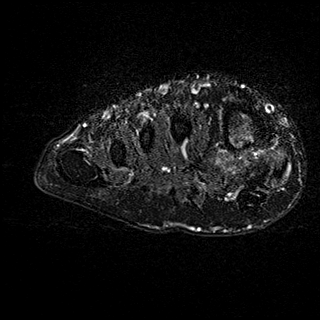
[im 36/45]
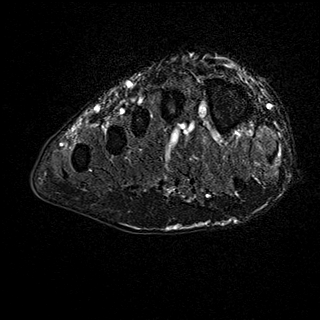
[im 40/45]
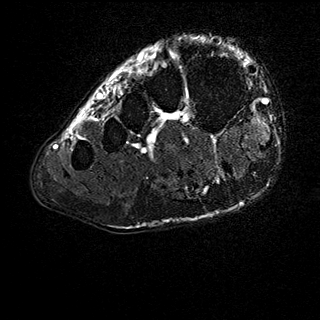
[im 45/45]
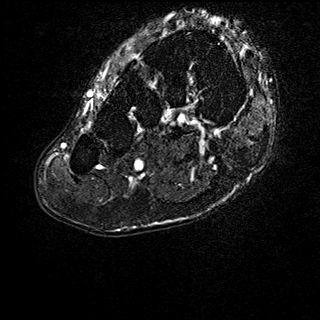

[Series 7: T1 · axial · right · 3.0mm · 0.70mm/px · z∈[-142,-74]mm · 5 of 21 slices shown (2 of 2)]
[im 1/21]
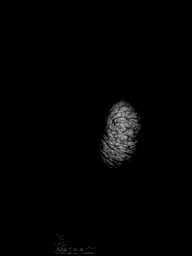
[im 6/21]
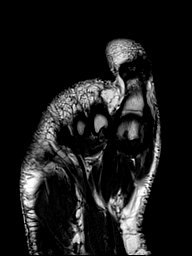
[im 11/21]
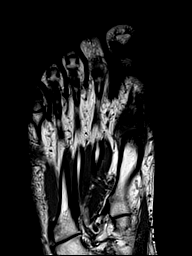
[im 16/21]
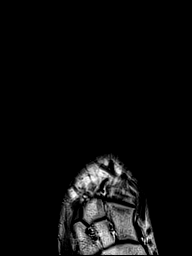
[im 21/21]
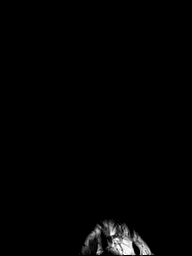

[Series 9: T2 · axial · right · 3.0mm · 0.70mm/px · z∈[-136,-74]mm · 5 of 20 slices shown (2 of 2)]
[im 1/20]
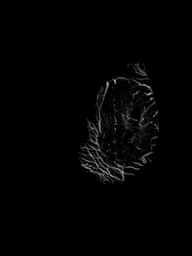
[im 5/20]
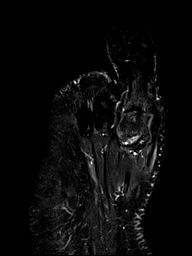
[im 10/20]
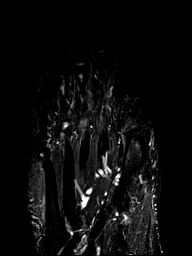
[im 15/20]
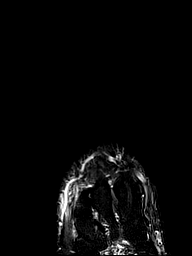
[im 20/20]
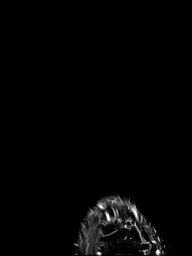

[Series 10: STIR · sagittal · right · 3.0mm · 0.62mm/px · 8 of 31 slices shown]
[im 1/31]
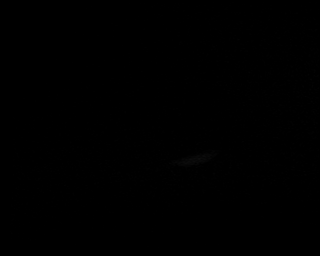
[im 5/31]
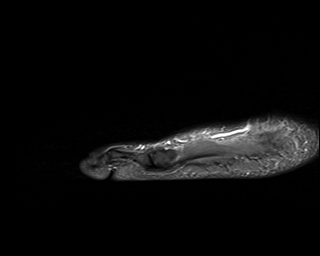
[im 9/31]
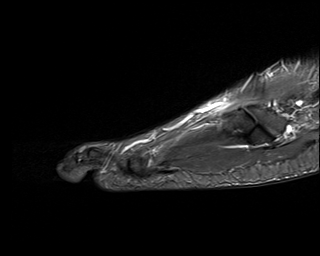
[im 13/31]
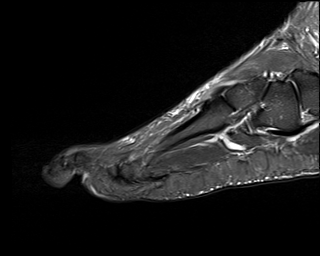
[im 18/31]
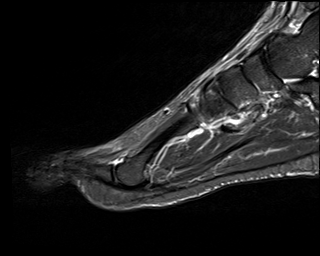
[im 22/31]
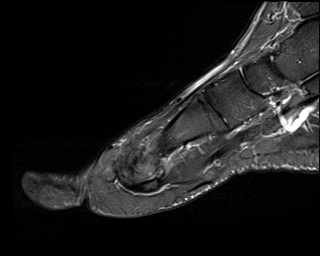
[im 26/31]
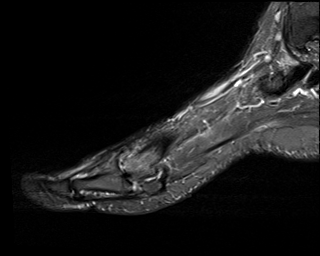
[im 31/31]
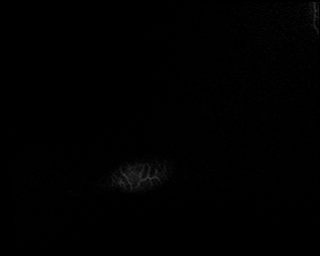

[40 of 40 positions shown; findings below may reference images not displayed]

FINDINGS: Bones/Joint/Cartilage

There is advanced first MTP joint osteoarthritis with joint space
loss subchondral cystic change and marginal osteophyte formation.
There is also slight plantar tilt of the first metatarsal at the MTP
joint. A small amount of joint fluid is seen. The medial and lateral
sesamoid appear to be intact. No avascular necrosis or fracture is
seen. Normal osseous marrow signal seen throughout the remainder of
the forefoot.

Ligaments

The visualized portion of the Lisfranc ligaments appear to be
intact. The plantar plate is intact. The visualized portion of the
plantar fascia is intact.

Muscles and Tendons

The flexor and extensor tendons are intact. The muscles are normal
in appearance without evidence of edema or atrophy.

Soft tissues

There is mild dorsal soft tissue swelling seen around the forefoot.
No soft tissue mass or loculated fluid collection.
IMPRESSION: 1. Advanced first MTP joint osteoarthritis with slight plantar tilt
of the first metatarsal, likely from prior injury.
2. The sesamoids are intact.
3. Small joint effusion at the first MTP joint.

## 2018-12-27 ENCOUNTER — Other Ambulatory Visit: Payer: Self-pay

## 2018-12-27 ENCOUNTER — Encounter: Payer: Self-pay | Admitting: Podiatry

## 2018-12-27 ENCOUNTER — Ambulatory Visit (INDEPENDENT_AMBULATORY_CARE_PROVIDER_SITE_OTHER): Payer: 59 | Admitting: Podiatry

## 2018-12-27 VITALS — Temp 98.0°F

## 2018-12-27 DIAGNOSIS — M205X2 Other deformities of toe(s) (acquired), left foot: Secondary | ICD-10-CM

## 2018-12-27 DIAGNOSIS — M205X1 Other deformities of toe(s) (acquired), right foot: Secondary | ICD-10-CM

## 2018-12-27 NOTE — Patient Instructions (Signed)
Pre-Operative Instructions  Congratulations, you have decided to take an important step towards improving your quality of life.  You can be assured that the doctors and staff at [facility] will be with you every step of the way.  Here are some important things you should know:  1. Plan to be at the surgery center/hospital at least 1 (one) hour prior to your scheduled time, unless otherwise directed by the surgical center/hospital staff.  You must have a responsible adult accompany you, remain during the surgery and drive you home.  Make sure you have directions to the surgical center/hospital to ensure you arrive on time. 2. If you are having surgery at [facility] or [facility], you will need a copy of your medical history and physical form from your family physician within one month prior to the date of surgery. We will give you a form for your primary physician to complete.  3. We make every effort to accommodate the date you request for surgery.  However, there are times where surgery dates or times have to be moved.  We will contact you as soon as possible if a change in schedule is required.   4. No aspirin/ibuprofen for one week before surgery.  If you are on aspirin, any non-steroidal anti-inflammatory medications (Mobic, Aleve, Ibuprofen) should not be taken seven (7) days prior to your surgery.  You make take Tylenol for pain prior to surgery.  5. Medications - If you are taking daily heart and blood pressure medications, seizure, reflux, allergy, asthma, anxiety, pain or diabetes medications, make sure you notify the surgery center/hospital before the day of surgery so they can tell you which medications you should take or avoid the day of surgery. 6. No food or drink after midnight the night before surgery unless directed otherwise by surgical center/hospital staff. 7. No alcoholic beverages 24-hours prior to surgery.  No smoking 24-hours prior or 24-hours after  surgery. 8. Wear loose pants or shorts. They should be loose enough to fit over bandages, boots, and casts. 9. Don't wear slip-on shoes. Sneakers are preferred. 10. Bring your boot with you to the surgery center/hospital.  Also bring crutches or a walker if your physician has prescribed it for you.  If you do not have this equipment, it will be provided for you after surgery. 11. If you have not been contacted by the surgery center/hospital by the day before your surgery, call to confirm the date and time of your surgery. 12. Leave-time from work may vary depending on the type of surgery you have.  Appropriate arrangements should be made prior to surgery with your employer. 13. Prescriptions will be provided immediately following surgery by your doctor.  Fill these as soon as possible after surgery and take the medication as directed. Pain medications will not be refilled on weekends and must be approved by the doctor. 14. Remove nail polish on the operative foot and avoid getting pedicures prior to surgery. 15. Wash the night before surgery.  The night before surgery wash the foot and leg well with water and the antibacterial soap provided. Be sure to pay special attention to beneath the toenails and in between the toes.  Wash for at least three (3) minutes. Rinse thoroughly with water and dry well with a towel.  Perform this wash unless told not to do so by your physician.  Enclosed: 1 Ice pack (please put in freezer the night before surgery)   1 Hibiclens skin cleaner     Pre-op instructions  If you have any questions regarding the instructions, please do not hesitate to call our office.  [place]: [address], [place], [place] [address] -- [phone]  [place]: [address] [place], [place] [address] -- [phone]  [place]: [address]  [place], [place] [address] -- [phone]  [place]: [address], [place], [place] [address] -- [phone]  Website:  [email or web address] 

## 2018-12-28 ENCOUNTER — Telehealth: Payer: Self-pay | Admitting: *Deleted

## 2018-12-28 NOTE — Telephone Encounter (Signed)
"  I'm a patient of Dr. Amalia Hailey.  I need to schedule a right foot surgery with you.  Give me a call back."

## 2018-12-29 DIAGNOSIS — G4733 Obstructive sleep apnea (adult) (pediatric): Secondary | ICD-10-CM | POA: Diagnosis not present

## 2018-12-29 NOTE — Telephone Encounter (Signed)
"  I'm calling to schedule my surgery."  Do you have a date that you would like?  "I'd like to do it whatever his next available date is going to be."  His next available date is February 23, 2019.  "Can you go ahead and put me down for that date but can you put me on the list for cancellations?"  Yes, I will put you on the wait list.

## 2018-12-30 NOTE — Progress Notes (Signed)
   HPI: 43 y.o. male presenting today for follow up evaluation of bilateral foot pain. He states his pain has not changed. He has been using the CAM boot with no significant relief. There are no modifying factors noted. He had an MRI of the right foot done on 12/21/2018. Patient is here for further evaluation and treatment.   Past Medical History:  Diagnosis Date  . Bulging lumbar disc   . CAP (community acquired pneumonia) 02/18/2016  . Cholinergic urticaria   . Epistaxis 05/24/2017  . 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 History of septic arthritis 2000   L knee  . Hypertension      Physical Exam: General: The patient is alert and oriented x3 in no acute distress.  Dermatology: Skin is warm, dry and supple bilateral lower extremities. Negative for open lesions or macerations.  Vascular: Palpable pedal pulses bilaterally. No edema or erythema noted. Capillary refill within normal limits.  Neurological: Epicritic and protective threshold grossly intact bilaterally.   Musculoskeletal Exam: Pain on palpation with limited range of motion noted to the first MPJ bilateral feet.  MRI Impression:  1. Advanced first MTP joint osteoarthritis with slight plantar tilt of the first metatarsal, likely from prior injury. 2. The sesamoids are intact. 3. Small joint effusion at the first MTP joint.  Assessment: 1. DJD / Hallux limitus bilateral, right greater than left    Plan of Care:  1. Patient evaluated. MRI reviewed.  2. Today we discussed the conservative versus surgical management of the presenting pathology. The patient opts for surgical management. All possible complications and details of the procedure were explained. All patient questions were answered. No guarantees were expressed or implied. 3. Authorization for surgery was initiated today. Surgery will consist of right great toe arthroplasty with implant.  4. Return to clinic one week post op.       Edrick Kins, DPM  [facility]  Dr. Edrick Kins, DPM    [address], [place] [address]                Office [phone]  Fax [phone]

## 2019-01-05 ENCOUNTER — Encounter: Payer: Self-pay | Admitting: Podiatry

## 2019-01-26 DIAGNOSIS — L501 Idiopathic urticaria: Secondary | ICD-10-CM | POA: Diagnosis not present

## 2019-01-28 DIAGNOSIS — Z1159 Encounter for screening for other viral diseases: Secondary | ICD-10-CM | POA: Diagnosis not present

## 2019-01-28 DIAGNOSIS — Z01812 Encounter for preprocedural laboratory examination: Secondary | ICD-10-CM | POA: Diagnosis not present

## 2019-01-28 DIAGNOSIS — Z20828 Contact with and (suspected) exposure to other viral communicable diseases: Secondary | ICD-10-CM | POA: Diagnosis not present

## 2019-01-29 DIAGNOSIS — I10 Essential (primary) hypertension: Secondary | ICD-10-CM | POA: Diagnosis not present

## 2019-01-29 DIAGNOSIS — K219 Gastro-esophageal reflux disease without esophagitis: Secondary | ICD-10-CM | POA: Diagnosis not present

## 2019-01-29 DIAGNOSIS — G4733 Obstructive sleep apnea (adult) (pediatric): Secondary | ICD-10-CM | POA: Diagnosis not present

## 2019-01-29 DIAGNOSIS — Z79899 Other long term (current) drug therapy: Secondary | ICD-10-CM | POA: Diagnosis not present

## 2019-01-30 DIAGNOSIS — K219 Gastro-esophageal reflux disease without esophagitis: Secondary | ICD-10-CM | POA: Diagnosis not present

## 2019-01-30 DIAGNOSIS — J45909 Unspecified asthma, uncomplicated: Secondary | ICD-10-CM | POA: Diagnosis not present

## 2019-01-30 DIAGNOSIS — E669 Obesity, unspecified: Secondary | ICD-10-CM | POA: Diagnosis not present

## 2019-01-30 DIAGNOSIS — I10 Essential (primary) hypertension: Secondary | ICD-10-CM | POA: Diagnosis not present

## 2019-01-30 DIAGNOSIS — G4733 Obstructive sleep apnea (adult) (pediatric): Secondary | ICD-10-CM | POA: Diagnosis not present

## 2019-02-03 ENCOUNTER — Telehealth: Payer: Self-pay

## 2019-02-03 NOTE — Telephone Encounter (Signed)
Patient notified that he should have a refill at [facility]. Topic: Quick Communication - Rx Refill/Question >> Feb 03, 2019 12:01 PM Carolyn Stare wrote: Medication citalopram (CELEXA) 40 MG tablet   Preferred Pharmacy  [facility]   Agent: Please be advised that RX refills may take up to 3 business days. We ask that you follow-up with your pharmacy.

## 2019-02-07 DIAGNOSIS — G4733 Obstructive sleep apnea (adult) (pediatric): Secondary | ICD-10-CM | POA: Diagnosis not present

## 2019-02-10 ENCOUNTER — Telehealth: Payer: Self-pay | Admitting: *Deleted

## 2019-02-10 NOTE — Telephone Encounter (Signed)
"  I have a procedure schedule for October 8, right foot joint replacement.  I have a question.  My ENT wants to do a surgery on the 26th of October, that's a Monday.  I have a question about whether Dr. Amalia Hailey thinks that's enough time in between the surgeries.  I also want to know if we might be any closer to knowing what time surgery will be.  I don't know if I have missed it on my portal but I'm just trying to schedule care for that day and care for my kids for that day."

## 2019-02-13 NOTE — Telephone Encounter (Signed)
"  I don't know if you have had time to listen to my message on Friday.  I just want to make sure you received it.  I am trying to schedule this other surgery and make arrangement for care for myself as well as my kids."  Let me call you back.  I need to confer with Dr. Amalia Hailey.  "Alright, thanks for your help."

## 2019-02-14 DIAGNOSIS — Z23 Encounter for immunization: Secondary | ICD-10-CM | POA: Diagnosis not present

## 2019-02-14 NOTE — Telephone Encounter (Signed)
I left him a message that Dr. Amalia Hailey said that was enough time in between the surgeries.  I also informed him that he will receive a call from someone from the surgical center a day or two prior to his surgery date and that they would give him his arrival time.  I asked him to call me back if he has any further questions.

## 2019-02-17 ENCOUNTER — Telehealth: Payer: Self-pay | Admitting: *Deleted

## 2019-02-17 NOTE — Telephone Encounter (Signed)
"  I got the email about what I need to do the day before and the day of surgery.  Just curious what time the surgery is going to be on Thursday, October 8.  If you could, give me a call or shoot me an email.  I am just trying to make arrangements for whose going to be there with my kids while I'm having this done."

## 2019-02-17 NOTE — Telephone Encounter (Signed)
I left him a message that someone from the surgical center would call him a day or two prior to his surgery date and will give him his arrival time.  I informed him that call at that time because they may have cancellations and do not want to continuously change peoples times or they may have diabetic patients and children that they like to do first.  I told him he's welcome to call the surgical center and see if they can give him a time.

## 2019-02-17 NOTE — Telephone Encounter (Signed)
DOS 02/23/2019 KELLER BUNION IMPLANT RIGHT FOOT - 28291  [facility]: Eligibility Date - 05/18/2018  Benefit percentage  60%Plan pays  40%You pay  Family integrated deductible  $0.00 to go     $2,800.00 out of $2800.00     Family integrated out-of-pocket  $0.00 to go     $8,000.00 out of $8000.00   Prior Authorization Requirements Outpatient surgery:  Not Required for this procedure.

## 2019-02-23 ENCOUNTER — Encounter: Payer: Self-pay | Admitting: Podiatry

## 2019-02-23 ENCOUNTER — Other Ambulatory Visit: Payer: Self-pay | Admitting: Podiatry

## 2019-02-23 DIAGNOSIS — M2021 Hallux rigidus, right foot: Secondary | ICD-10-CM

## 2019-02-23 DIAGNOSIS — M25571 Pain in right ankle and joints of right foot: Secondary | ICD-10-CM | POA: Diagnosis not present

## 2019-02-23 DIAGNOSIS — M205X1 Other deformities of toe(s) (acquired), right foot: Secondary | ICD-10-CM | POA: Diagnosis not present

## 2019-02-23 DIAGNOSIS — I10 Essential (primary) hypertension: Secondary | ICD-10-CM | POA: Diagnosis not present

## 2019-02-23 MED ORDER — OXYCODONE-ACETAMINOPHEN 5-325 MG PO TABS: 1.0000 | ORAL_TABLET | Freq: Four times a day (QID) | ORAL | 0 refills | Status: DC | PRN

## 2019-02-23 NOTE — Progress Notes (Signed)
.  postop

## 2019-02-24 ENCOUNTER — Other Ambulatory Visit: Payer: Self-pay

## 2019-02-24 DIAGNOSIS — L501 Idiopathic urticaria: Secondary | ICD-10-CM | POA: Diagnosis not present

## 2019-02-24 MED ORDER — ACETAMINOPHEN-CODEINE #3 300-30 MG PO TABS: 1.0000 | ORAL_TABLET | ORAL | 0 refills | Status: DC | PRN

## 2019-02-24 MED ORDER — ONDANSETRON HCL 4 MG PO TABS: 4.0000 mg | ORAL_TABLET | Freq: Three times a day (TID) | ORAL | 0 refills | Status: DC | PRN

## 2019-02-24 NOTE — Progress Notes (Unsigned)
Patient called stating that he is having some nausea and wants to be prescibed tylenol with codeine instead of the oxycodone. He is also requesting a medication for nausea.

## 2019-02-27 ENCOUNTER — Other Ambulatory Visit: Payer: Self-pay | Admitting: Podiatry

## 2019-02-27 MED ORDER — ACETAMINOPHEN-CODEINE #3 300-30 MG PO TABS: 1.0000 | ORAL_TABLET | ORAL | 0 refills | Status: DC | PRN

## 2019-02-27 NOTE — Progress Notes (Signed)
PRN postop pain 

## 2019-02-27 NOTE — Progress Notes (Signed)
Tylenol #3 sent to pharmacy - Dr. Amalia Hailey

## 2019-03-03 ENCOUNTER — Other Ambulatory Visit: Payer: 59

## 2019-03-06 ENCOUNTER — Telehealth: Payer: Self-pay | Admitting: *Deleted

## 2019-03-06 NOTE — Telephone Encounter (Signed)
Pt states he was told to call if he had any bleeding from the surgery site of 02/23/2019 and he was removing the wrap and there was blood on the dressing that looked like it had been there a while.

## 2019-03-06 NOTE — Telephone Encounter (Signed)
I called pt and he said the area of old dried blood was about the length of 4 quarters, and he was told after the 02/25/2019 episode of bleeding that his wife changed the dressing for, that any bleeding should be reported and the dressing left in place. Pt states he has photos of 02/25/2019 and can take photos of today and email. I gave pt my email and told him to send both sets.

## 2019-03-07 ENCOUNTER — Encounter: Payer: Self-pay | Admitting: Podiatry

## 2019-03-07 ENCOUNTER — Ambulatory Visit (INDEPENDENT_AMBULATORY_CARE_PROVIDER_SITE_OTHER): Payer: 59

## 2019-03-07 ENCOUNTER — Ambulatory Visit (INDEPENDENT_AMBULATORY_CARE_PROVIDER_SITE_OTHER): Payer: 59 | Admitting: Podiatry

## 2019-03-07 ENCOUNTER — Other Ambulatory Visit: Payer: Self-pay

## 2019-03-07 VITALS — BP 114/70 | HR 73 | Temp 98.3°F

## 2019-03-07 DIAGNOSIS — M205X2 Other deformities of toe(s) (acquired), left foot: Secondary | ICD-10-CM | POA: Diagnosis not present

## 2019-03-07 DIAGNOSIS — M205X1 Other deformities of toe(s) (acquired), right foot: Secondary | ICD-10-CM

## 2019-03-07 DIAGNOSIS — Z9889 Other specified postprocedural states: Secondary | ICD-10-CM

## 2019-03-07 NOTE — Patient Instructions (Signed)
Pre-Operative Instructions  Congratulations, you have decided to take an important step towards improving your quality of life.  You can be assured that the doctors and staff at [facility] will be with you every step of the way.  Here are some important things you should know:  1. Plan to be at the surgery center/hospital at least 1 (one) hour prior to your scheduled time, unless otherwise directed by the surgical center/hospital staff.  You must have a responsible adult accompany you, remain during the surgery and drive you home.  Make sure you have directions to the surgical center/hospital to ensure you arrive on time. 2. If you are having surgery at [facility] or [facility], you will need a copy of your medical history and physical form from your family physician within one month prior to the date of surgery. We will give you a form for your primary physician to complete.  3. We make every effort to accommodate the date you request for surgery.  However, there are times where surgery dates or times have to be moved.  We will contact you as soon as possible if a change in schedule is required.   4. No aspirin/ibuprofen for one week before surgery.  If you are on aspirin, any non-steroidal anti-inflammatory medications (Mobic, Aleve, Ibuprofen) should not be taken seven (7) days prior to your surgery.  You make take Tylenol for pain prior to surgery.  5. Medications - If you are taking daily heart and blood pressure medications, seizure, reflux, allergy, asthma, anxiety, pain or diabetes medications, make sure you notify the surgery center/hospital before the day of surgery so they can tell you which medications you should take or avoid the day of surgery. 6. No food or drink after midnight the night before surgery unless directed otherwise by surgical center/hospital staff. 7. No alcoholic beverages 24-hours prior to surgery.  No smoking 24-hours prior or 24-hours after  surgery. 8. Wear loose pants or shorts. They should be loose enough to fit over bandages, boots, and casts. 9. Don't wear slip-on shoes. Sneakers are preferred. 10. Bring your boot with you to the surgery center/hospital.  Also bring crutches or a walker if your physician has prescribed it for you.  If you do not have this equipment, it will be provided for you after surgery. 11. If you have not been contacted by the surgery center/hospital by the day before your surgery, call to confirm the date and time of your surgery. 12. Leave-time from work may vary depending on the type of surgery you have.  Appropriate arrangements should be made prior to surgery with your employer. 13. Prescriptions will be provided immediately following surgery by your doctor.  Fill these as soon as possible after surgery and take the medication as directed. Pain medications will not be refilled on weekends and must be approved by the doctor. 14. Remove nail polish on the operative foot and avoid getting pedicures prior to surgery. 15. Wash the night before surgery.  The night before surgery wash the foot and leg well with water and the antibacterial soap provided. Be sure to pay special attention to beneath the toenails and in between the toes.  Wash for at least three (3) minutes. Rinse thoroughly with water and dry well with a towel.  Perform this wash unless told not to do so by your physician.  Enclosed: 1 Ice pack (please put in freezer the night before surgery)   1 Hibiclens skin cleaner     Pre-op instructions  If you have any questions regarding the instructions, please do not hesitate to call our office.  [place]: [address], [place], [place] [address] -- [phone]  [place]: [address] [place], [place] [address] -- [phone]  [place]: [address]  , [place] [address] -- [phone]   Website: [email or web address] 

## 2019-03-09 ENCOUNTER — Other Ambulatory Visit: Payer: Self-pay | Admitting: Family Medicine

## 2019-03-09 ENCOUNTER — Telehealth: Payer: Self-pay | Admitting: *Deleted

## 2019-03-09 MED ORDER — METAXALONE 800 MG PO TABS: 800.0000 mg | ORAL_TABLET | Freq: Three times a day (TID) | ORAL | 3 refills | Status: DC

## 2019-03-09 NOTE — Telephone Encounter (Signed)
Requested medication (s) are due for refill today: yes  Requested medication (s) are on the active medication list: yes  Last refill:  10/17/2018  Future visit scheduled: yes  Notes to clinic:  Refill cannot be delegated    Requested Prescriptions  Pending Prescriptions Disp Refills   metaxalone (SKELAXIN) 800 MG tablet 90 tablet 3    Sig: Take 1 tablet (800 mg total) by mouth 3 (three) times daily.     Not Delegated - Analgesics:  Muscle Relaxants Failed - 03/09/2019  1:13 PM      Failed - This refill cannot be delegated      Passed - Valid encounter within last 6 months    Recent Outpatient Visits          4 months ago Acute bilateral low back pain with right-sided sciatica   [place], Megan P, DO   1 year ago OSA (obstructive sleep apnea)   [place], Megan P, DO   1 year ago Cholinergic urticaria   [place], [place], DO   1 year ago Epigastric pain   [place], [place], [place]   1 year ago Routine general medical examination at a health care facility   [facility], Barb Merino, DO      Future Appointments            In 1 month [facility], Barb Merino, DO [employer], [place]

## 2019-03-09 NOTE — Telephone Encounter (Signed)
"  I am because Dr. Amalia Hailey wants me to schedule a joint replacement on my left foot.  I guess late November or December of this year.  He said his schedule is filling up, so he wanted me to go ahead and call you.  He just did surgery on my right foot now he needs to do my left foot."

## 2019-03-09 NOTE — Telephone Encounter (Signed)
Routing to provider  

## 2019-03-09 NOTE — Telephone Encounter (Signed)
metaxalone (SKELAXIN) 800 MG tablet    Patient is requesting a refill.    Pharmacy:  [facility] [place] [phone] (Phone) [phone] (Fax

## 2019-03-10 NOTE — Progress Notes (Signed)
   Subjective:  Patient presents today status post right great toe implant. DOS: 02/23/2019. He states he is doing well. He reports some pain that is tolerable with Naproxen. He has been using the CAM boot as directed. Patient is here for further evaluation and treatment.    Past Medical History:  Diagnosis Date  . Bulging lumbar disc   . CAP (community acquired pneumonia) 02/18/2016  . Cholinergic urticaria   . Epistaxis 05/24/2017  . 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 History of septic arthritis 2000   L knee  . Hypertension       Objective/Physical Exam Neurovascular status intact.  Skin incisions appear to be well coapted with sutures and staples intact. No sign of infectious process noted. No dehiscence. No active bleeding noted. Moderate edema noted to the surgical extremity. Pain on palpation with limited range of motion noted to the first MPJ left foot.  Radiographic Exam:  Orthopedic hardware and osteotomies sites appear to be stable with routine healing.  Assessment: 1. s/p right great toe implant. DOS: 02/23/2019 2. Hallux limitus / DJD left    Plan of Care:  1. Patient was evaluated. X-rays reviewed 2. Today we discussed the conservative versus surgical management of the presenting pathology. The patient opts for surgical management. All possible complications and details of the procedure were explained. All patient questions were answered. No guarantees were expressed or implied. 3. Authorization for surgery was initiated today. Surgery will consist of 1st MPJ arthroplasty with implant left.  4. Dressing changed. Keep clean, dry and intact for one week.  5. Continue using CAM boot.  6. Return to clinic in one week.    Edrick Kins, DPM [facility]  Dr. Edrick Kins, [place]                                        [place], [place] [address]                Office [phone]  Fax [phone]

## 2019-03-10 NOTE — Telephone Encounter (Signed)
I attempted to return his call.  I left him a message informing him that Dr. Amalia Hailey had a cancellation for April 06, 2019 or he can do it on his next available date which is

## 2019-03-11 DIAGNOSIS — Z01812 Encounter for preprocedural laboratory examination: Secondary | ICD-10-CM | POA: Diagnosis not present

## 2019-03-11 DIAGNOSIS — Z20828 Contact with and (suspected) exposure to other viral communicable diseases: Secondary | ICD-10-CM | POA: Diagnosis not present

## 2019-03-13 DIAGNOSIS — Z9989 Dependence on other enabling machines and devices: Secondary | ICD-10-CM | POA: Diagnosis not present

## 2019-03-13 DIAGNOSIS — L508 Other urticaria: Secondary | ICD-10-CM | POA: Diagnosis not present

## 2019-03-13 DIAGNOSIS — J342 Deviated nasal septum: Secondary | ICD-10-CM | POA: Diagnosis not present

## 2019-03-13 DIAGNOSIS — I10 Essential (primary) hypertension: Secondary | ICD-10-CM | POA: Diagnosis not present

## 2019-03-13 DIAGNOSIS — J3489 Other specified disorders of nose and nasal sinuses: Secondary | ICD-10-CM | POA: Diagnosis not present

## 2019-03-13 DIAGNOSIS — G4733 Obstructive sleep apnea (adult) (pediatric): Secondary | ICD-10-CM | POA: Diagnosis not present

## 2019-03-13 DIAGNOSIS — J3501 Chronic tonsillitis: Secondary | ICD-10-CM | POA: Diagnosis not present

## 2019-03-13 DIAGNOSIS — J45909 Unspecified asthma, uncomplicated: Secondary | ICD-10-CM | POA: Diagnosis not present

## 2019-03-13 DIAGNOSIS — J189 Pneumonia, unspecified organism: Secondary | ICD-10-CM | POA: Diagnosis not present

## 2019-03-13 DIAGNOSIS — D682 Hereditary deficiency of other clotting factors: Secondary | ICD-10-CM | POA: Diagnosis not present

## 2019-03-13 NOTE — Telephone Encounter (Signed)
I attempted to call the patient again to schedule his surgery.

## 2019-03-14 ENCOUNTER — Encounter: Payer: 59 | Admitting: Podiatry

## 2019-03-14 DIAGNOSIS — I10 Essential (primary) hypertension: Secondary | ICD-10-CM | POA: Diagnosis not present

## 2019-03-14 DIAGNOSIS — J3489 Other specified disorders of nose and nasal sinuses: Secondary | ICD-10-CM | POA: Diagnosis not present

## 2019-03-14 DIAGNOSIS — D682 Hereditary deficiency of other clotting factors: Secondary | ICD-10-CM | POA: Diagnosis not present

## 2019-03-14 DIAGNOSIS — J342 Deviated nasal septum: Secondary | ICD-10-CM | POA: Diagnosis not present

## 2019-03-14 DIAGNOSIS — G4733 Obstructive sleep apnea (adult) (pediatric): Secondary | ICD-10-CM | POA: Diagnosis not present

## 2019-03-14 DIAGNOSIS — J189 Pneumonia, unspecified organism: Secondary | ICD-10-CM | POA: Diagnosis not present

## 2019-03-14 DIAGNOSIS — L508 Other urticaria: Secondary | ICD-10-CM | POA: Diagnosis not present

## 2019-03-14 DIAGNOSIS — J45909 Unspecified asthma, uncomplicated: Secondary | ICD-10-CM | POA: Diagnosis not present

## 2019-03-14 DIAGNOSIS — J3501 Chronic tonsillitis: Secondary | ICD-10-CM | POA: Diagnosis not present

## 2019-03-17 DIAGNOSIS — J95830 Postprocedural hemorrhage and hematoma of a respiratory system organ or structure following a respiratory system procedure: Secondary | ICD-10-CM | POA: Diagnosis not present

## 2019-03-17 DIAGNOSIS — I10 Essential (primary) hypertension: Secondary | ICD-10-CM | POA: Diagnosis not present

## 2019-03-17 DIAGNOSIS — G4733 Obstructive sleep apnea (adult) (pediatric): Secondary | ICD-10-CM | POA: Diagnosis not present

## 2019-03-17 DIAGNOSIS — K920 Hematemesis: Secondary | ICD-10-CM | POA: Diagnosis not present

## 2019-03-17 DIAGNOSIS — E669 Obesity, unspecified: Secondary | ICD-10-CM | POA: Diagnosis not present

## 2019-03-17 DIAGNOSIS — G8918 Other acute postprocedural pain: Secondary | ICD-10-CM | POA: Diagnosis not present

## 2019-03-17 DIAGNOSIS — I9762 Postprocedural hemorrhage of a circulatory system organ or structure following other procedure: Secondary | ICD-10-CM | POA: Diagnosis not present

## 2019-03-17 DIAGNOSIS — Z20828 Contact with and (suspected) exposure to other viral communicable diseases: Secondary | ICD-10-CM | POA: Diagnosis not present

## 2019-03-17 DIAGNOSIS — R041 Hemorrhage from throat: Secondary | ICD-10-CM | POA: Diagnosis not present

## 2019-03-17 DIAGNOSIS — J45909 Unspecified asthma, uncomplicated: Secondary | ICD-10-CM | POA: Diagnosis not present

## 2019-03-18 DIAGNOSIS — Z20828 Contact with and (suspected) exposure to other viral communicable diseases: Secondary | ICD-10-CM | POA: Diagnosis not present

## 2019-03-18 DIAGNOSIS — G8918 Other acute postprocedural pain: Secondary | ICD-10-CM | POA: Diagnosis not present

## 2019-03-18 DIAGNOSIS — K920 Hematemesis: Secondary | ICD-10-CM | POA: Diagnosis not present

## 2019-03-18 DIAGNOSIS — G4733 Obstructive sleep apnea (adult) (pediatric): Secondary | ICD-10-CM | POA: Diagnosis not present

## 2019-03-18 DIAGNOSIS — E669 Obesity, unspecified: Secondary | ICD-10-CM | POA: Diagnosis not present

## 2019-03-18 DIAGNOSIS — J45909 Unspecified asthma, uncomplicated: Secondary | ICD-10-CM | POA: Diagnosis not present

## 2019-03-18 DIAGNOSIS — I10 Essential (primary) hypertension: Secondary | ICD-10-CM | POA: Diagnosis not present

## 2019-03-18 DIAGNOSIS — R041 Hemorrhage from throat: Secondary | ICD-10-CM | POA: Diagnosis not present

## 2019-03-19 DIAGNOSIS — K91840 Postprocedural hemorrhage and hematoma of a digestive system organ or structure following a digestive system procedure: Secondary | ICD-10-CM | POA: Diagnosis not present

## 2019-03-19 DIAGNOSIS — E669 Obesity, unspecified: Secondary | ICD-10-CM | POA: Diagnosis not present

## 2019-03-19 DIAGNOSIS — G4733 Obstructive sleep apnea (adult) (pediatric): Secondary | ICD-10-CM | POA: Diagnosis not present

## 2019-03-19 DIAGNOSIS — K219 Gastro-esophageal reflux disease without esophagitis: Secondary | ICD-10-CM | POA: Diagnosis not present

## 2019-03-19 DIAGNOSIS — J3489 Other specified disorders of nose and nasal sinuses: Secondary | ICD-10-CM | POA: Diagnosis not present

## 2019-03-19 DIAGNOSIS — Z6830 Body mass index (BMI) 30.0-30.9, adult: Secondary | ICD-10-CM | POA: Diagnosis not present

## 2019-03-19 DIAGNOSIS — I10 Essential (primary) hypertension: Secondary | ICD-10-CM | POA: Diagnosis not present

## 2019-03-19 DIAGNOSIS — J45909 Unspecified asthma, uncomplicated: Secondary | ICD-10-CM | POA: Diagnosis not present

## 2019-03-19 DIAGNOSIS — J95830 Postprocedural hemorrhage and hematoma of a respiratory system organ or structure following a respiratory system procedure: Secondary | ICD-10-CM | POA: Diagnosis not present

## 2019-03-19 DIAGNOSIS — R40241 Glasgow coma scale score 13-15, unspecified time: Secondary | ICD-10-CM | POA: Diagnosis not present

## 2019-03-20 DIAGNOSIS — E669 Obesity, unspecified: Secondary | ICD-10-CM | POA: Diagnosis not present

## 2019-03-20 DIAGNOSIS — J3489 Other specified disorders of nose and nasal sinuses: Secondary | ICD-10-CM | POA: Diagnosis not present

## 2019-03-20 DIAGNOSIS — Z6830 Body mass index (BMI) 30.0-30.9, adult: Secondary | ICD-10-CM | POA: Diagnosis not present

## 2019-03-20 DIAGNOSIS — J45909 Unspecified asthma, uncomplicated: Secondary | ICD-10-CM | POA: Diagnosis not present

## 2019-03-20 DIAGNOSIS — G4733 Obstructive sleep apnea (adult) (pediatric): Secondary | ICD-10-CM | POA: Diagnosis not present

## 2019-03-20 DIAGNOSIS — I10 Essential (primary) hypertension: Secondary | ICD-10-CM | POA: Diagnosis not present

## 2019-03-20 DIAGNOSIS — K219 Gastro-esophageal reflux disease without esophagitis: Secondary | ICD-10-CM | POA: Diagnosis not present

## 2019-03-20 DIAGNOSIS — K91840 Postprocedural hemorrhage and hematoma of a digestive system organ or structure following a digestive system procedure: Secondary | ICD-10-CM | POA: Diagnosis not present

## 2019-03-20 DIAGNOSIS — J95830 Postprocedural hemorrhage and hematoma of a respiratory system organ or structure following a respiratory system procedure: Secondary | ICD-10-CM | POA: Diagnosis not present

## 2019-03-20 DIAGNOSIS — R40241 Glasgow coma scale score 13-15, unspecified time: Secondary | ICD-10-CM | POA: Diagnosis not present

## 2019-03-21 DIAGNOSIS — E669 Obesity, unspecified: Secondary | ICD-10-CM | POA: Diagnosis not present

## 2019-03-21 DIAGNOSIS — J45909 Unspecified asthma, uncomplicated: Secondary | ICD-10-CM | POA: Diagnosis not present

## 2019-03-21 DIAGNOSIS — R40241 Glasgow coma scale score 13-15, unspecified time: Secondary | ICD-10-CM | POA: Diagnosis not present

## 2019-03-21 DIAGNOSIS — Z6830 Body mass index (BMI) 30.0-30.9, adult: Secondary | ICD-10-CM | POA: Diagnosis not present

## 2019-03-21 DIAGNOSIS — K91840 Postprocedural hemorrhage and hematoma of a digestive system organ or structure following a digestive system procedure: Secondary | ICD-10-CM | POA: Diagnosis not present

## 2019-03-21 DIAGNOSIS — G4733 Obstructive sleep apnea (adult) (pediatric): Secondary | ICD-10-CM | POA: Diagnosis not present

## 2019-03-21 DIAGNOSIS — J3489 Other specified disorders of nose and nasal sinuses: Secondary | ICD-10-CM | POA: Diagnosis not present

## 2019-03-21 DIAGNOSIS — I10 Essential (primary) hypertension: Secondary | ICD-10-CM | POA: Diagnosis not present

## 2019-03-21 DIAGNOSIS — K219 Gastro-esophageal reflux disease without esophagitis: Secondary | ICD-10-CM | POA: Diagnosis not present

## 2019-03-21 NOTE — Telephone Encounter (Signed)
I attempted to call the patient again.  I left him a message to call me back to schedule his appointment.

## 2019-03-28 ENCOUNTER — Ambulatory Visit: Payer: 59 | Admitting: Podiatry

## 2019-03-30 DIAGNOSIS — Z09 Encounter for follow-up examination after completed treatment for conditions other than malignant neoplasm: Secondary | ICD-10-CM | POA: Diagnosis not present

## 2019-03-30 DIAGNOSIS — G4733 Obstructive sleep apnea (adult) (pediatric): Secondary | ICD-10-CM | POA: Diagnosis not present

## 2019-04-20 ENCOUNTER — Telehealth: Payer: Self-pay | Admitting: *Deleted

## 2019-04-20 NOTE — Telephone Encounter (Signed)
"  I'm scheduled for surgery on April 27, 2019."  I don't have you scheduled for April 27, 2019.  We were playing phone tag and you never called me back.  "I'm sorry, maybe I was thinking I had called you back and set it up.  I've had so much going on.  I have had a throat surgery while I've been out as well and I had to have two other throat surgeries since the initial one.  I'm tired of being cut on for the year.  So, I think I'm going to hold off until 2021.  I'll schedule an appointment to see Dr. Amalia Hailey in the meantime."  I'll let Dr. Amalia Hailey know.

## 2019-04-21 ENCOUNTER — Other Ambulatory Visit: Payer: Self-pay

## 2019-04-21 ENCOUNTER — Ambulatory Visit
Admission: RE | Admit: 2019-04-21 | Discharge: 2019-04-21 | Disposition: A | Discharge status: Home / Self Care | Payer: 59 | Attending: Family Medicine | Admitting: Family Medicine

## 2019-04-21 ENCOUNTER — Ambulatory Visit
Admission: RE | Admit: 2019-04-21 | Discharge: 2019-04-21 | Disposition: A | Discharge status: Home / Self Care | Payer: 59 | Source: Ambulatory Visit | Attending: Family Medicine | Admitting: Family Medicine

## 2019-04-21 ENCOUNTER — Ambulatory Visit (INDEPENDENT_AMBULATORY_CARE_PROVIDER_SITE_OTHER): Payer: 59 | Admitting: Family Medicine

## 2019-04-21 ENCOUNTER — Encounter: Payer: Self-pay | Admitting: Family Medicine

## 2019-04-21 VITALS — BP 134/87 | HR 67 | Temp 99.1°F | Ht 72.56 in | Wt 242.2 lb

## 2019-04-21 DIAGNOSIS — I10 Essential (primary) hypertension: Secondary | ICD-10-CM

## 2019-04-21 DIAGNOSIS — Z13220 Encounter for screening for lipoid disorders: Secondary | ICD-10-CM

## 2019-04-21 DIAGNOSIS — K625 Hemorrhage of anus and rectum: Secondary | ICD-10-CM | POA: Diagnosis not present

## 2019-04-21 DIAGNOSIS — Z23 Encounter for immunization: Secondary | ICD-10-CM | POA: Diagnosis not present

## 2019-04-21 DIAGNOSIS — E7521 Fabry (-Anderson) disease: Secondary | ICD-10-CM

## 2019-04-21 DIAGNOSIS — Z0000 Encounter for general adult medical examination without abnormal findings: Secondary | ICD-10-CM | POA: Diagnosis not present

## 2019-04-21 DIAGNOSIS — M25522 Pain in left elbow: Secondary | ICD-10-CM | POA: Diagnosis not present

## 2019-04-21 DIAGNOSIS — R198 Other specified symptoms and signs involving the digestive system and abdomen: Secondary | ICD-10-CM

## 2019-04-21 DIAGNOSIS — K219 Gastro-esophageal reflux disease without esophagitis: Secondary | ICD-10-CM

## 2019-04-21 DIAGNOSIS — L501 Idiopathic urticaria: Secondary | ICD-10-CM | POA: Diagnosis not present

## 2019-04-21 DIAGNOSIS — F419 Anxiety disorder, unspecified: Secondary | ICD-10-CM | POA: Diagnosis not present

## 2019-04-21 LAB — UA/M W/RFLX CULTURE, ROUTINE
Bilirubin, UA: NEGATIVE
Glucose, UA: NEGATIVE
Leukocytes,UA: NEGATIVE
Nitrite, UA: NEGATIVE
Protein,UA: NEGATIVE
RBC, UA: NEGATIVE
Specific Gravity, UA: 1.025 (ref 1.005–1.030)
Urobilinogen, Ur: 0.2 mg/dL (ref 0.2–1.0)
pH, UA: 6.5 (ref 5.0–7.5)

## 2019-04-21 LAB — MICROALBUMIN, URINE WAIVED
Creatinine, Urine Waived: 300 mg/dL (ref 10–300)
Microalb, Ur Waived: 30 mg/L — ABNORMAL HIGH (ref 0–19)
Microalb/Creat Ratio: <30 mg/g (ref <30)

## 2019-04-21 LAB — BAYER DCA HB A1C WAIVED: HB A1C (BAYER DCA - WAIVED): 5.2 % (ref <7.0)

## 2019-04-21 IMAGING — CR DG ELBOW COMPLETE 3+V*L*
4 series · 4 of 4 positions shown · non-contrast
Comparison: None.

CLINICAL DATA: Lateral sided elbow pain for the past 3 months. No
known injury.

EXAM:
LEFT ELBOW - COMPLETE 3+ VIEW

[elbow ap]
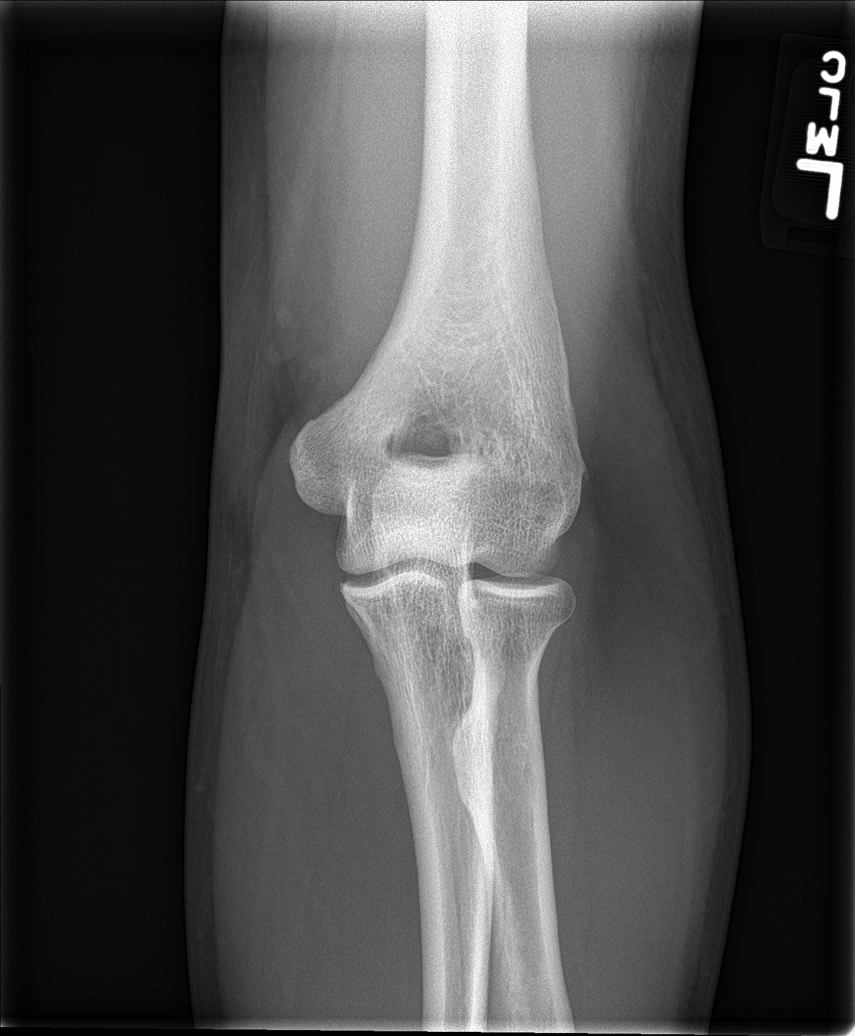

[elbow obl (1 of 2)]
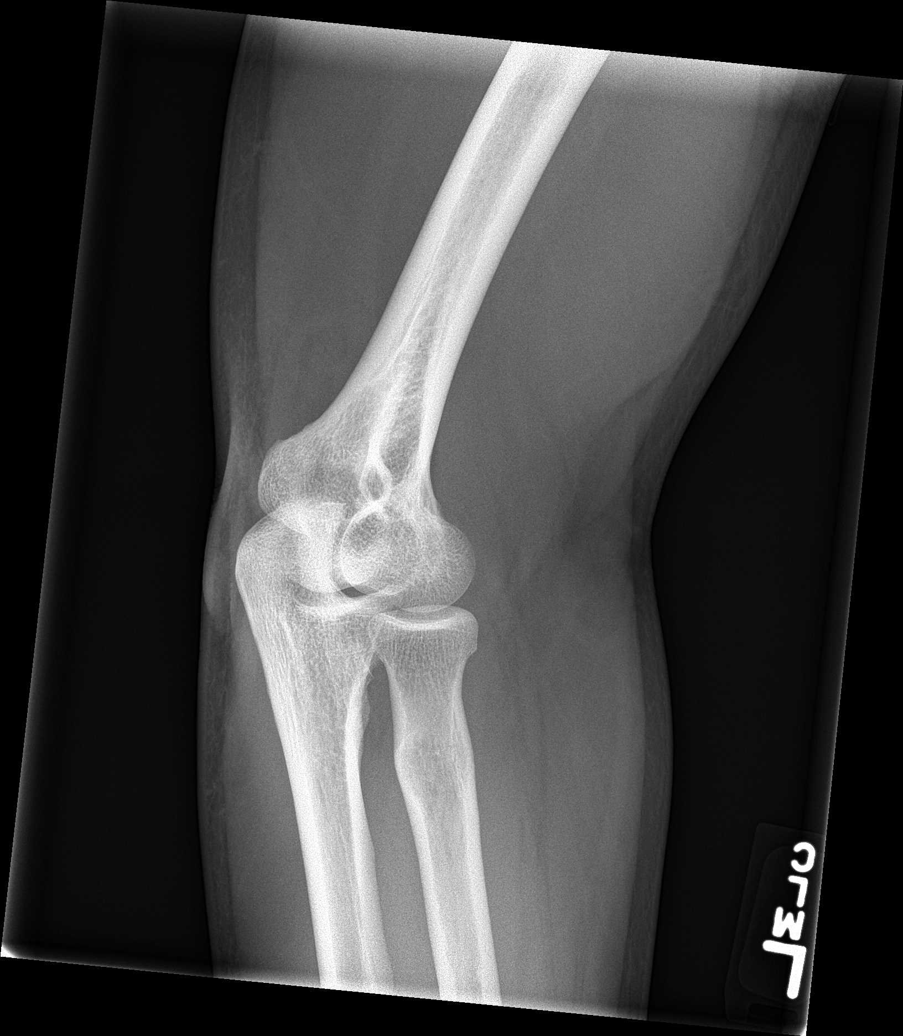

[elbow obl (2 of 2)]
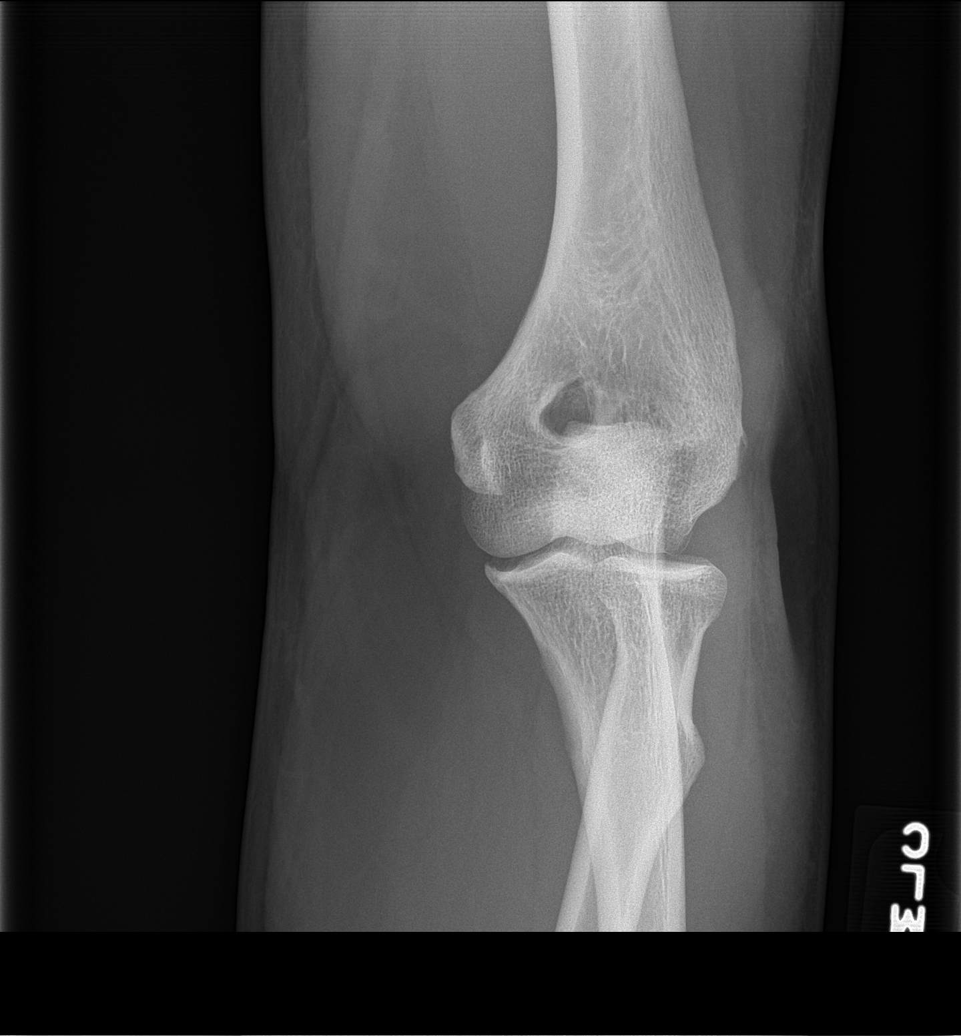

[elbow lat]
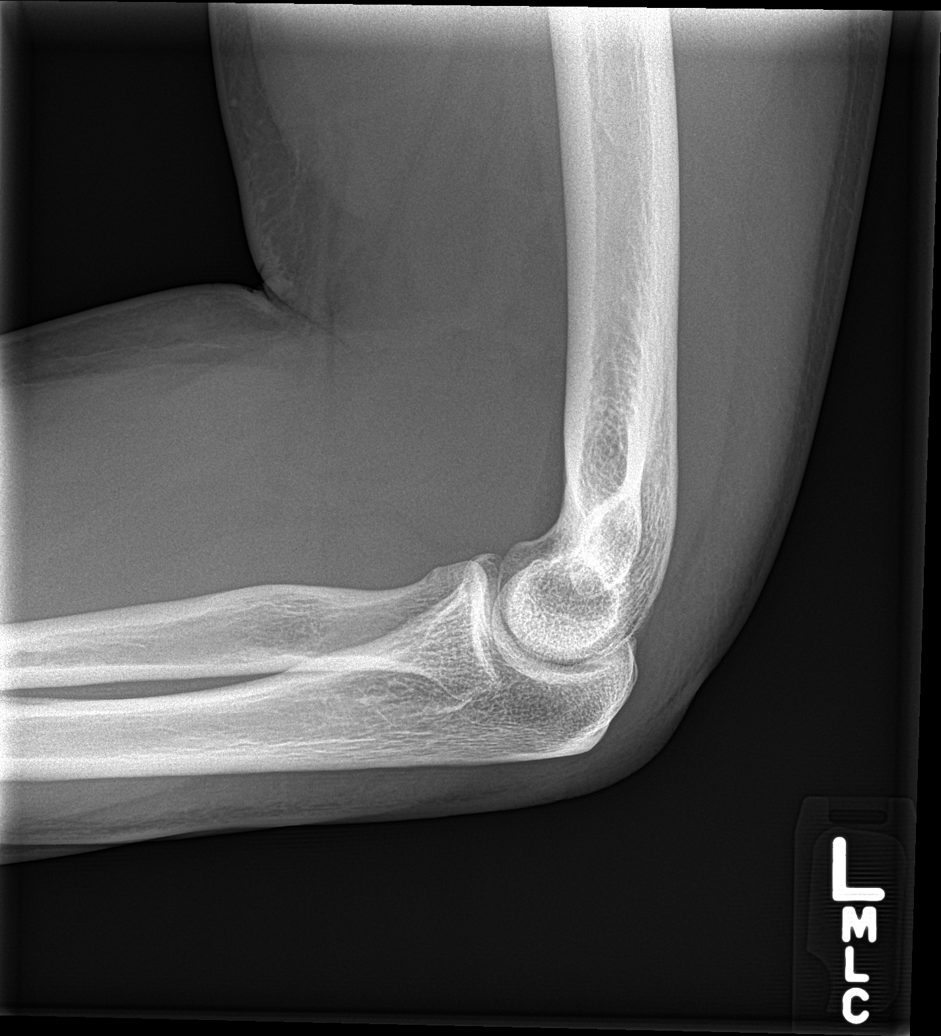

[4 of 4 positions shown; findings below may reference images not displayed]

FINDINGS: No fracture or elbow joint effusion. Joint spaces are preserved.
Minimal enthesopathic change involving the lateral epicondyle.
Regional soft tissues appear normal.
IMPRESSION: Minimal enthesopathic change involving the lateral epicondyle as
could be seen in the setting of previous avulsive
injury/epicondylitis. Clinical correlation is advised.

## 2019-04-21 MED ORDER — NAPROXEN 500 MG PO TABS: 500.0000 mg | ORAL_TABLET | Freq: Two times a day (BID) | ORAL | 6 refills | Status: DC

## 2019-04-21 MED ORDER — CITALOPRAM HYDROBROMIDE 40 MG PO TABS: 40.0000 mg | ORAL_TABLET | Freq: Every day | ORAL | 1 refills | Status: DC

## 2019-04-21 MED ORDER — AMLODIPINE BESYLATE 5 MG PO TABS: 5.0000 mg | ORAL_TABLET | Freq: Every day | ORAL | 1 refills | Status: DC

## 2019-04-21 NOTE — Assessment & Plan Note (Signed)
Under good control on current regimen. Continue current regimen. Continue to monitor. Call with any concerns. Refills given.   

## 2019-04-21 NOTE — Progress Notes (Signed)
BP 134/87   Pulse 67   Temp 99.1 F (37.3 C)   Ht 6' 0.56" (1.843 m)   Wt 242 lb 4 oz (109.9 kg)   SpO2 100%   BMI 32.35 kg/m    Subjective:    Patient ID: John Huang, male    DOB: [birth year], 43 y.o.   MRN: [id number]  HPI: John Huang is a 43 y.o. male presenting on 04/21/2019 for comprehensive medical examination. Current medical complaints include:  ARM PAIN Duration: 3 months Location: left in the elbow Mechanism of injury: unknown Onset: gradual Severity: severe  Quality:  Sharp pain with movement and constant dull ache Frequency: constant Radiation: into his forearm Aggravating factors: moving it   Alleviating factors: ice, physical therapy, HEP, APAP and NSAIDs  Status: worse Treatments attempted: rest, ice, heat, APAP, ibuprofen, aleve, physical therapy and HEP  Relief with NSAIDs?:  mild- maybe Swelling: no Redness: no  Warmth: no Trauma: no Chest pain: no  Shortness of breath: no  Fever: no Decreased sensation: no Paresthesias: yes- when he sleeps Weakness: no  HYPERTENSION Hypertension status: controlled  Satisfied with current treatment? yes Duration of hypertension: chronic BP monitoring frequency:  not checking BP medication side effects:  no Medication compliance: excellent compliance Previous BP meds: amlodipine Aspirin: no Recurrent headaches: no Visual changes: no Palpitations: no Dyspnea: no Chest pain: no Lower extremity edema: no Dizzy/lightheaded: no  ANXIETY/STRESS Duration:controlled Anxious mood: no  Excessive worrying: no Irritability: no  Sweating: no Nausea: no Palpitations:no Hyperventilation: no Panic attacks: no Agoraphobia: no  Obscessions/compulsions: no Depressed mood: no Depression screen [facility] 2/9 04/21/2019 12/14/2017 10/21/2017  Decreased Interest 0 0 0  Down, Depressed, Hopeless 0 0 0  PHQ - 2 Score 0 0 0  Altered sleeping 0 3 0  Tired, decreased energy 0 3 0  Change in appetite 0 3 3  Feeling  bad or failure about yourself  0 1 0  Trouble concentrating 0 1 0  Moving slowly or fidgety/restless 0 1 0  Suicidal thoughts 0 0 0  PHQ-9 Score 0 12 3  Difficult doing work/chores Not difficult at all Somewhat difficult Not difficult at all   Anhedonia: no Weight changes: no Insomnia: no   Hypersomnia: no Fatigue/loss of energy: no Feelings of worthlessness: no Feelings of guilt: no Impaired concentration/indecisiveness: no Suicidal ideations: no  Crying spells: no Recent Stressors/Life Changes: no   Relationship problems: no   Family stress: no     Financial stress: no    Job stress: no    Recent death/loss: no  He currently lives with: wife and kids Interim Problems from his last visit: yes  Depression Screen done today and results listed below:  Depression screen [facility] 2/9 04/21/2019 12/14/2017 10/21/2017  Decreased Interest 0 0 0  Down, Depressed, Hopeless 0 0 0  PHQ - 2 Score 0 0 0  Altered sleeping 0 3 0  Tired, decreased energy 0 3 0  Change in appetite 0 3 3  Feeling bad or failure about yourself  0 1 0  Trouble concentrating 0 1 0  Moving slowly or fidgety/restless 0 1 0  Suicidal thoughts 0 0 0  PHQ-9 Score 0 12 3  Difficult doing work/chores Not difficult at all Somewhat difficult Not difficult at all    Past Medical History:  Past Medical History:  Diagnosis Date  . Bulging lumbar disc   . CAP (community acquired pneumonia) 02/18/2016  . Cholinergic urticaria   . Epistaxis  05/24/2017  . 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 History of septic arthritis 2000   L knee  . Hypertension     Surgical History:  Past Surgical History:  Procedure Laterality Date  . ANTERIOR CRUCIATE LIGAMENT REPAIR    . FOOT SURGERY  2020  . MEDIAL COLLATERAL LIGAMENT AND LATERAL COLLATERAL LIGAMENT REPAIR, KNEE    . SHOULDER SURGERY     dislocation and tac left in there, so had to have surgery to removed the tac  . TONSILLECTOMY  2020  . WISDOM TOOTH EXTRACTION       Medications:  Current Outpatient Medications on File Prior to Visit  Medication Sig  . fexofenadine (ALLEGRA) 180 MG tablet Take 1-2 tablets (180-360 mg total) by mouth daily.  . Multiple Vitamin (MULTI-VITAMINS) TABS Take by mouth.   No current facility-administered medications on file prior to visit.     Allergies:  Allergies  Allergen Reactions  . Cat Hair Extract Hives and Other (See Comments)    Wheezing, eyes itch  . Galactose Other (See Comments)    Flu like symptoms after consuming mammalian products    Social History:  Social History   Socioeconomic History  . Marital status: Married    Spouse name: Not on file  . Number of children: Not on file  . Years of education: Not on file  . Highest education level: Not on file  Occupational History  . Not on file  Social Needs  . Financial resource strain: Not on file  . Food insecurity    Worry: Not on file    Inability: Not on file  . Transportation needs    Medical: Not on file    Non-medical: Not on file  Tobacco Use  . Smoking status: Never Smoker  . Smokeless tobacco: Never Used  Substance and Sexual Activity  . Alcohol use: Yes    Comment: occasion  . Drug use: Never  . Sexual activity: Yes    Birth control/protection: None  Lifestyle  . Physical activity    Days per week: Not on file    Minutes per session: Not on file  . Stress: Not on file  Relationships  . Social Herbalist on phone: Not on file    Gets together: Not on file    Attends religious service: Not on file    Active member of club or organization: Not on file    Attends meetings of clubs or organizations: Not on file    Relationship status: Not on file  . Intimate partner violence    Fear of current or ex partner: Not on file    Emotionally abused: Not on file    Physically abused: Not on file    Forced sexual activity: Not on file  Other Topics Concern  . Not on file  Social History Narrative  . Not on file    Social History   Tobacco Use  Smoking Status Never Smoker  Smokeless Tobacco Never Used   Social History   Substance and Sexual Activity  Alcohol Use Yes   Comment: occasion    Family History:  Family History  Problem Relation Age of Onset  . Stroke Mother   . Hypertension Mother   . Diabetes Mother   . Cancer Father        Throat  . Cancer Sister        Eye/Brain  . Hypertension Brother   . Irritable bowel  syndrome Brother     Past medical history, surgical history, medications, allergies, family history and social history reviewed with patient today and changes made to appropriate areas of the chart.   Review of Systems  Constitutional: Positive for diaphoresis (mainly at night). Negative for chills, fever, malaise/fatigue and weight loss.  HENT: Positive for sore throat. Negative for congestion, ear discharge, ear pain, hearing loss, nosebleeds, sinus pain and tinnitus.        Itching in his ears   Eyes: Negative.   Respiratory: Negative.  Negative for stridor.   Cardiovascular: Negative.   Gastrointestinal: Positive for blood in stool, constipation and heartburn. Negative for abdominal pain, diarrhea, melena, nausea and vomiting.       History of fistula, thin stools, blood in his stools  Genitourinary: Negative.   Musculoskeletal: Positive for joint pain and myalgias. Negative for back pain, falls and neck pain.  Skin: Negative.   Neurological: Negative.   Endo/Heme/Allergies: Positive for environmental allergies. Negative for polydipsia. Does not bruise/bleed easily.  Psychiatric/Behavioral: Negative.     All other ROS negative except what is listed above and in the HPI.      Objective:    BP 134/87   Pulse 67   Temp 99.1 F (37.3 C)   Ht 6' 0.56" (1.843 m)   Wt 242 lb 4 oz (109.9 kg)   SpO2 100%   BMI 32.35 kg/m   Wt Readings from Last 3 Encounters:  04/21/19 242 lb 4 oz (109.9 kg)  05/22/18 255 lb (115.7 kg)  05/19/18 255 lb (115.7 kg)     Physical Exam Vitals signs and nursing note reviewed.  Constitutional:      General: He is not in acute distress.    Appearance: Normal appearance. He is obese. He is not ill-appearing, toxic-appearing or diaphoretic.  HENT:     Head: Normocephalic and atraumatic.     Right Ear: Tympanic membrane, ear canal and external ear normal. There is no impacted cerumen.     Left Ear: Tympanic membrane, ear canal and external ear normal. There is no impacted cerumen.     Nose: Nose normal. No congestion or rhinorrhea.     Mouth/Throat:     Mouth: Mucous membranes are moist.     Pharynx: Oropharynx is clear. No oropharyngeal exudate or posterior oropharyngeal erythema.  Eyes:     General: No scleral icterus.       Right eye: No discharge.        Left eye: No discharge.     Extraocular Movements: Extraocular movements intact.     Conjunctiva/sclera: Conjunctivae normal.     Pupils: Pupils are equal, round, and reactive to light.  Neck:     Musculoskeletal: Normal range of motion and neck supple. No neck rigidity or muscular tenderness.     Vascular: No carotid bruit.  Cardiovascular:     Rate and Rhythm: Normal rate and regular rhythm.     Pulses: Normal pulses.     Heart sounds: No murmur. No friction rub. No gallop.   Pulmonary:     Effort: Pulmonary effort is normal. No respiratory distress.     Breath sounds: Normal breath sounds. No stridor. No wheezing, rhonchi or rales.  Chest:     Chest wall: No tenderness.  Abdominal:     General: Abdomen is flat. Bowel sounds are normal. There is no distension.     Palpations: Abdomen is soft. There is no mass.     Tenderness: There is  no abdominal tenderness. There is no right CVA tenderness, left CVA tenderness, guarding or rebound.     Hernia: No hernia is present.  Genitourinary:    Comments: Genital exam deferred with shared decision making Musculoskeletal: Normal range of motion.        General: No swelling, tenderness (no tenderness on  lateral or medial epicondyles), deformity or signs of injury.     Right lower leg: No edema.     Left lower leg: No edema.  Lymphadenopathy:     Cervical: No cervical adenopathy.  Skin:    General: Skin is warm and dry.     Capillary Refill: Capillary refill takes less than 2 seconds.     Coloration: Skin is not jaundiced or pale.     Findings: No bruising, erythema, lesion or rash.  Neurological:     General: No focal deficit present.     Mental Status: He is alert and oriented to person, place, and time.     Cranial Nerves: No cranial nerve deficit.     Sensory: No sensory deficit.     Motor: No weakness.     Coordination: Coordination normal.     Gait: Gait normal.     Deep Tendon Reflexes: Reflexes normal.  Psychiatric:        Mood and Affect: Mood normal.        Behavior: Behavior normal.        Thought Content: Thought content normal.        Judgment: Judgment normal.     Results for orders placed or performed in visit on 33/00/76  Basic metabolic panel  Result Value Ref Range   Glucose 84 65 - 99 mg/dL   BUN 12 6 - 24 mg/dL   Creatinine, Ser 0.99 0.76 - 1.27 mg/dL   GFR calc non Af Amer 93 >59 mL/min/1.73   GFR calc Af Amer 107 >59 mL/min/1.73   BUN/Creatinine Ratio 12 9 - 20   Sodium 143 134 - 144 mmol/L   Potassium 4.3 3.5 - 5.2 mmol/L   Chloride 103 96 - 106 mmol/L   CO2 24 20 - 29 mmol/L   Calcium 9.4 8.7 - 10.2 mg/dL      Assessment & Plan:   Problem List Items Addressed This Visit      Cardiovascular and Mediastinum   Hypertension    Under good control on current regimen. Continue current regimen. Continue to monitor. Call with any concerns. Refills given. Labs drawn today.       Relevant Medications   amLODipine (NORVASC) 5 MG tablet   Other Relevant Orders   CBC with Differential OUT   Comp Met (CMET)   Microalbumin, Urine Waived   TSH   UA/M w/rflx Culture, Routine   Alpha-galactosidase A deficiency (HCC)    Stable. Continue to monitor and  watch his diet. Call with any concerns.       Relevant Medications   amLODipine (NORVASC) 5 MG tablet     Digestive   GERD (gastroesophageal reflux disease)    Under good control on current regimen. Continue current regimen. Continue to monitor. Call with any concerns. Refills given. Labs drawn today.       Relevant Orders   CBC with Differential OUT   Comp Met (CMET)     Other   Anxiety    Under good control on current regimen. Continue current regimen. Continue to monitor. Call with any concerns. Refills given.        Relevant  Medications   citalopram (CELEXA) 40 MG tablet   Other Relevant Orders   CBC with Differential OUT   Comp Met (CMET)   TSH    Other Visit Diagnoses    Routine general medical examination at a health care facility    -  Primary   Vaccines updated. Screening labs checked today. Continue diet and exercise. Call with any concerns. Continue to monitor.    Relevant Orders   Bayer DCA Hb A1c Waived   CBC with Differential OUT   Comp Met (CMET)   Lipid Panel w/o Chol/HDL Ratio OUT   TSH   UA/M w/rflx Culture, Routine   Screening for cholesterol level       Labs drawn today. Await results.    Relevant Orders   Lipid Panel w/o Chol/HDL Ratio OUT   Left elbow pain       Will obtain x-ray. Await results. Call with any concerns.    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 Referral generated today.    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 Referral generated today.    Relevant Orders   Ambulatory referral to Gastroenterology   Need for Tdap vaccination       Tdap given today.   Relevant Orders   Tdap vaccine greater than or equal to 7yo IM       LABORATORY TESTING:  Health maintenance labs ordered today as discussed above.   IMMUNIZATIONS:    - Tdap: Tetanus vaccination status reviewed: Tdap vaccination indicated and given today. - Influenza: Up to date - Pneumovax: Not applicable   SCREENING: - Colonoscopy: Not applicable  Discussed with patient purpose of the colonoscopy is to detect colon cancer at curable precancerous or early stages   PATIENT COUNSELING:    Sexuality: Discussed sexually transmitted diseases, partner selection, use of condoms, avoidance of unintended pregnancy  and contraceptive alternatives.   Advised to avoid cigarette smoking.  I discussed with the patient that most people either abstain from alcohol or drink within safe limits (<=14/week and <=4 drinks/occasion for males, <=7/weeks and <= 3 drinks/occasion for females) and that the risk for alcohol disorders and other health effects rises proportionally with the number of drinks per week and how often a drinker exceeds daily limits.  Discussed cessation/primary prevention of drug use and availability of treatment for abuse.   Diet: Encouraged to adjust caloric intake to maintain  or achieve ideal body weight, to reduce intake of dietary saturated fat and total fat, to limit sodium intake by avoiding high sodium foods and not adding table salt, and to maintain adequate dietary potassium and calcium preferably from fresh fruits, vegetables, and low-fat dairy products.    stressed the importance of regular exercise  Injury prevention: Discussed safety belts, safety helmets, smoke detector, smoking near bedding or upholstery.   Dental health: Discussed importance of regular tooth brushing, flossing, and dental visits.   Follow up plan: NEXT PREVENTATIVE PHYSICAL DUE IN 1 YEAR. Return in about 6 months (around 10/20/2019).

## 2019-04-21 NOTE — Assessment & Plan Note (Signed)
Under good control on current regimen. Continue current regimen. Continue to monitor. Call with any concerns. Refills given. Labs drawn today.   

## 2019-04-21 NOTE — Assessment & Plan Note (Signed)
Stable. Continue to monitor and watch his diet. Call with any concerns.

## 2019-04-21 NOTE — Patient Instructions (Signed)

## 2019-04-22 LAB — CBC WITH DIFFERENTIAL/PLATELET
Basophils Absolute: 0 10*3/uL (ref 0.0–0.2)
Basos: 0 %
EOS (ABSOLUTE): 0.1 10*3/uL (ref 0.0–0.4)
Eos: 2 %
Hematocrit: 40.5 % (ref 37.5–51.0)
Hemoglobin: 12.8 g/dL — ABNORMAL LOW (ref 13.0–17.7)
Immature Grans (Abs): 0 10*3/uL (ref 0.0–0.1)
Immature Granulocytes: 0 %
Lymphocytes Absolute: 2.1 10*3/uL (ref 0.7–3.1)
Lymphs: 47 %
MCH: 28.6 pg (ref 26.6–33.0)
MCHC: 31.6 g/dL (ref 31.5–35.7)
MCV: 90 fL (ref 79–97)
Monocytes Absolute: 0.4 10*3/uL (ref 0.1–0.9)
Monocytes: 9 %
Neutrophils Absolute: 1.9 10*3/uL (ref 1.4–7.0)
Neutrophils: 42 %
Platelets: 188 10*3/uL (ref 150–450)
RBC: 4.48 x10E6/uL (ref 4.14–5.80)
RDW: 15.3 % (ref 11.6–15.4)
WBC: 4.6 10*3/uL (ref 3.4–10.8)

## 2019-04-22 LAB — COMPREHENSIVE METABOLIC PANEL
ALT: 21 IU/L (ref 0–44)
AST: 19 IU/L (ref 0–40)
Albumin/Globulin Ratio: 1.5 (ref 1.2–2.2)
Albumin: 4.1 g/dL (ref 4.0–5.0)
Alkaline Phosphatase: 75 IU/L (ref 39–117)
BUN/Creatinine Ratio: 11 (ref 9–20)
BUN: 11 mg/dL (ref 6–24)
Bilirubin Total: <0.2 mg/dL (ref 0.0–1.2)
CO2: 24 mmol/L (ref 20–29)
Calcium: 9 mg/dL (ref 8.7–10.2)
Chloride: 105 mmol/L (ref 96–106)
Creatinine, Ser: 0.99 mg/dL (ref 0.76–1.27)
GFR calc Af Amer: 107 mL/min/{1.73_m2} (ref >59)
GFR calc non Af Amer: 93 mL/min/{1.73_m2} (ref >59)
Globulin, Total: 2.8 g/dL (ref 1.5–4.5)
Glucose: 88 mg/dL (ref 65–99)
Potassium: 4.1 mmol/L (ref 3.5–5.2)
Sodium: 142 mmol/L (ref 134–144)
Total Protein: 6.9 g/dL (ref 6.0–8.5)

## 2019-04-22 LAB — LIPID PANEL W/O CHOL/HDL RATIO
Cholesterol, Total: 156 mg/dL (ref 100–199)
HDL: 40 mg/dL (ref >39)
LDL Chol Calc (NIH): 100 mg/dL — ABNORMAL HIGH (ref 0–99)
Triglycerides: 82 mg/dL (ref 0–149)
VLDL Cholesterol Cal: 16 mg/dL (ref 5–40)

## 2019-04-22 LAB — TSH: TSH: 2.06 u[IU]/mL (ref 0.450–4.500)

## 2019-04-23 DIAGNOSIS — Z20828 Contact with and (suspected) exposure to other viral communicable diseases: Secondary | ICD-10-CM | POA: Diagnosis not present

## 2019-04-27 ENCOUNTER — Encounter: Payer: Self-pay | Admitting: Family Medicine

## 2019-04-28 ENCOUNTER — Other Ambulatory Visit: Payer: Self-pay | Admitting: Family Medicine

## 2019-04-28 DIAGNOSIS — M7712 Lateral epicondylitis, left elbow: Secondary | ICD-10-CM | POA: Diagnosis not present

## 2019-04-28 DIAGNOSIS — M25522 Pain in left elbow: Secondary | ICD-10-CM

## 2019-05-05 DIAGNOSIS — M7712 Lateral epicondylitis, left elbow: Secondary | ICD-10-CM | POA: Diagnosis not present

## 2019-05-07 DIAGNOSIS — Z20828 Contact with and (suspected) exposure to other viral communicable diseases: Secondary | ICD-10-CM | POA: Diagnosis not present

## 2019-05-25 ENCOUNTER — Encounter: Payer: Self-pay | Admitting: Family Medicine

## 2019-05-26 ENCOUNTER — Encounter: Payer: Self-pay | Admitting: Family Medicine

## 2019-05-26 ENCOUNTER — Other Ambulatory Visit: Payer: Self-pay

## 2019-05-26 ENCOUNTER — Ambulatory Visit (INDEPENDENT_AMBULATORY_CARE_PROVIDER_SITE_OTHER): Payer: 59 | Admitting: Family Medicine

## 2019-05-26 DIAGNOSIS — M2550 Pain in unspecified joint: Secondary | ICD-10-CM

## 2019-05-26 NOTE — Progress Notes (Signed)
There were no vitals taken for this visit.   Subjective:    Patient ID: John Huang, male    DOB: [birth year], 44 y.o.   MRN: [id number]  HPI: John Huang is a 44 y.o. male  Chief Complaint  Patient presents with  . Night Sweats    joint pain. growing pains magnified.    ARTHRALGIAS / JOINT ACHES- was having pain in his elbow- went to see ortho and got 2 shots, but is gone now. Now having pains in different joints. He has been having pain in his back, R shoulder, L knee and bilateral feet, he notes that he has had surgery in all those locations and has had injuries in those locations. His pain is worse at night than during the day Duration: months Pain: yes Symmetric: no Severity: severe Quality: aching and sore Frequency: intermittent Context:  worse Decreased function/range of motion: yes  Erythema: no Swelling: yes Heat or warmth: no Morning stiffness: no Aggravating factors: laying down at night  Relief with NSAIDs?: no Treatments attempted:  voltaren, rest, ice, heat, APAP, ibuprofen, aleve, physical therapy and HEP  Involved Joints:     Hands: no     Wrists: no      Elbows: yes left    Shoulders: yes right    Back: yes     Hips: no     Knees: yes left    Ankles: no     Feet: yes bilateral  Relevant past medical, surgical, family and social history reviewed and updated as indicated. Interim medical history since our last visit reviewed. Allergies and medications reviewed and updated.  Review of Systems  Constitutional: Positive for diaphoresis. Negative for activity change, appetite change, chills, fatigue, fever and unexpected weight change.  Respiratory: Negative.   Cardiovascular: Negative.   Musculoskeletal: Positive for arthralgias, back pain and myalgias. Negative for gait problem, joint swelling, neck pain and neck stiffness.  Skin: Negative.   Neurological: Negative.   Psychiatric/Behavioral: Negative.     Per HPI unless specifically  indicated above     Objective:    There were no vitals taken for this visit.  Wt Readings from Last 3 Encounters:  04/21/19 242 lb 4 oz (109.9 kg)  05/22/18 255 lb (115.7 kg)  05/19/18 255 lb (115.7 kg)    Physical Exam Vitals and nursing note reviewed.  Pulmonary:     Effort: Pulmonary effort is normal. No respiratory distress.     Comments: Speaking in full sentences Neurological:     Mental Status: He is alert.  Psychiatric:        Mood and Affect: Mood normal.        Behavior: Behavior normal.        Thought Content: Thought content normal.        Judgment: Judgment normal.     Results for orders placed or performed in visit on 04/21/19  Bayer DCA Hb A1c Waived  Result Value Ref Range   HB A1C (BAYER DCA - WAIVED) 5.2 <7.0 %  CBC with Differential OUT  Result Value Ref Range   WBC 4.6 3.4 - 10.8 x10E3/uL   RBC 4.48 4.14 - 5.80 x10E6/uL   Hemoglobin 12.8 (L) 13.0 - 17.7 g/dL   Hematocrit 40.5 37.5 - 51.0 %   MCV 90 79 - 97 fL   MCH 28.6 26.6 - 33.0 pg   MCHC 31.6 31.5 - 35.7 g/dL   RDW 15.3 11.6 - 15.4 %   Platelets 188  150 - 450 x10E3/uL   Neutrophils 42 Not Estab. %   Lymphs 47 Not Estab. %   Monocytes 9 Not Estab. %   Eos 2 Not Estab. %   Basos 0 Not Estab. %   Neutrophils Absolute 1.9 1.4 - 7.0 x10E3/uL   Lymphocytes Absolute 2.1 0.7 - 3.1 x10E3/uL   Monocytes Absolute 0.4 0.1 - 0.9 x10E3/uL   EOS (ABSOLUTE) 0.1 0.0 - 0.4 x10E3/uL   Basophils Absolute 0.0 0.0 - 0.2 x10E3/uL   Immature Granulocytes 0 Not Estab. %   Immature Grans (Abs) 0.0 0.0 - 0.1 x10E3/uL  Comp Met (CMET)  Result Value Ref Range   Glucose 88 65 - 99 mg/dL   BUN 11 6 - 24 mg/dL   Creatinine, Ser 0.99 0.76 - 1.27 mg/dL   GFR calc non Af Amer 93 >59 mL/min/1.73   GFR calc Af Amer 107 >59 mL/min/1.73   BUN/Creatinine Ratio 11 9 - 20   Sodium 142 134 - 144 mmol/L   Potassium 4.1 3.5 - 5.2 mmol/L   Chloride 105 96 - 106 mmol/L   CO2 24 20 - 29 mmol/L   Calcium 9.0 8.7 - 10.2 mg/dL     Total Protein 6.9 6.0 - 8.5 g/dL   Albumin 4.1 4.0 - 5.0 g/dL   Globulin, Total 2.8 1.5 - 4.5 g/dL   Albumin/Globulin Ratio 1.5 1.2 - 2.2   Bilirubin Total <0.2 0.0 - 1.2 mg/dL   Alkaline Phosphatase 75 39 - 117 IU/L   AST 19 0 - 40 IU/L   ALT 21 0 - 44 IU/L  Lipid Panel w/o Chol/HDL Ratio OUT  Result Value Ref Range   Cholesterol, Total 156 100 - 199 mg/dL   Triglycerides 82 0 - 149 mg/dL   HDL 40 >39 mg/dL   VLDL Cholesterol Cal 16 5 - 40 mg/dL   LDL Chol Calc (NIH) 100 (H) 0 - 99 mg/dL  Microalbumin, Urine Waived  Result Value Ref Range   Microalb, Ur Waived 30 (H) 0 - 19 mg/L   Creatinine, Urine Waived 300 10 - 300 mg/dL   Microalb/Creat Ratio <30 <30 mg/g  TSH  Result Value Ref Range   TSH 2.060 0.450 - 4.500 uIU/mL  UA/M w/rflx Culture, Routine   Specimen: Blood   BLD  Result Value Ref Range   Specific Gravity, UA 1.025 1.005 - 1.030   pH, UA 6.5 5.0 - 7.5   Color, UA Yellow Yellow   Appearance Ur Clear Clear   Leukocytes,UA Negative Negative   Protein,UA Negative Negative/Trace   Glucose, UA Negative Negative   Ketones, UA Trace (A) Negative   RBC, UA Negative Negative   Bilirubin, UA Negative Negative   Urobilinogen, Ur 0.2 0.2 - 1.0 mg/dL   Nitrite, UA Negative Negative      Assessment & Plan:   Problem List Items Addressed This Visit    None    Visit Diagnoses    Arthralgia, unspecified joint    -  Primary   ?post-traumatic arthritis. Will check labs next week. Await results. Continue naproxen/voltaren. Call with any concerns.    Relevant Orders   RA Qn+CCP(IgG/A)+SjoSSA+SjoSSB   Uric acid   Sed Rate (ESR)   CK (Creatine Kinase)   Antinuclear Antib (ANA)   Testosterone, free, total([facility])   Ehrlichia Antibody Panel   Babesia microti Antibody Panel   Lyme Ab/Western Blot Reflex   Rocky mtn spotted fvr abs pnl(IgG+IgM)       Follow up plan: Return  if symptoms worsen or fail to improve.   . This visit was completed via telephone  due to the restrictions of the COVID-19 pandemic. All issues as above were discussed and addressed but no physical exam was performed. If it was felt that the patient should be evaluated in the office, they were directed there. The patient verbally consented to this visit. Patient was unable to complete an audio/visual visit due to Lack of equipment. Due to the catastrophic nature of the COVID-19 pandemic, this visit was done through audio contact only. . Location of the patient: home . Location of the provider: home . Those involved with this call:  . Provider: Park Liter, DO . CMA: Lauretta Grill, CMA . Front Desk/Registration: Don Perking  . Time spent on call: 15 minutes on the phone discussing health concerns. 23 minutes total spent in review of patient's record and preparation of their chart.

## 2019-05-31 ENCOUNTER — Other Ambulatory Visit: Payer: Self-pay

## 2019-05-31 ENCOUNTER — Other Ambulatory Visit: Payer: 59

## 2019-05-31 DIAGNOSIS — M2550 Pain in unspecified joint: Secondary | ICD-10-CM

## 2019-06-02 LAB — ROCKY MTN SPOTTED FVR ABS PNL(IGG+IGM)
RMSF IgG: NEGATIVE
RMSF IgM: 0.97 index — ABNORMAL HIGH (ref 0.00–0.89)

## 2019-06-02 LAB — ANA: Anti Nuclear Antibody (ANA): NEGATIVE

## 2019-06-02 LAB — TESTOSTERONE, FREE, TOTAL, SHBG
Sex Hormone Binding: 21.1 nmol/L (ref 16.5–55.9)
Testosterone, Free: 6.4 pg/mL — ABNORMAL LOW (ref 6.8–21.5)
Testosterone: 256 ng/dL — ABNORMAL LOW (ref 264–916)

## 2019-06-02 LAB — LYME AB/WESTERN BLOT REFLEX
LYME DISEASE AB, QUANT, IGM: <0.8 index (ref 0.00–0.79)
Lyme IgG/IgM Ab: <0.91 {ISR} (ref 0.00–0.90)

## 2019-06-02 LAB — BABESIA MICROTI ANTIBODY PANEL
Babesia microti IgG: <1:10 {titer}
Babesia microti IgM: <1:10 {titer}

## 2019-06-02 LAB — RA QN+CCP(IGG/A)+SJOSSA+SJOSSB
Cyclic Citrullin Peptide Ab: 21 units — ABNORMAL HIGH (ref 0–19)
ENA SSA (RO) Ab: <0.2 AI (ref 0.0–0.9)
ENA SSB (LA) Ab: <0.2 AI (ref 0.0–0.9)
Rheumatoid fact SerPl-aCnc: <10 IU/mL (ref 0.0–13.9)

## 2019-06-02 LAB — EHRLICHIA ANTIBODY PANEL
E. Chaffeensis (HME) IgM Titer: NEGATIVE
E.Chaffeensis (HME) IgG: NEGATIVE
HGE IgG Titer: NEGATIVE
HGE IgM Titer: NEGATIVE

## 2019-06-02 LAB — CK: Total CK: 123 U/L (ref 49–439)

## 2019-06-02 LAB — SEDIMENTATION RATE: Sed Rate: 20 mm/hr — ABNORMAL HIGH (ref 0–15)

## 2019-06-02 LAB — URIC ACID: Uric Acid: 4.4 mg/dL (ref 3.8–8.4)

## 2019-06-05 ENCOUNTER — Other Ambulatory Visit: Payer: Self-pay | Admitting: Family Medicine

## 2019-06-05 DIAGNOSIS — R899 Unspecified abnormal finding in specimens from other organs, systems and tissues: Secondary | ICD-10-CM

## 2019-06-09 DIAGNOSIS — L501 Idiopathic urticaria: Secondary | ICD-10-CM | POA: Diagnosis not present

## 2019-06-12 ENCOUNTER — Other Ambulatory Visit: Payer: Self-pay | Admitting: Family Medicine

## 2019-06-19 ENCOUNTER — Ambulatory Visit (INDEPENDENT_AMBULATORY_CARE_PROVIDER_SITE_OTHER): Payer: 59 | Admitting: Gastroenterology

## 2019-06-19 ENCOUNTER — Encounter: Payer: Self-pay | Admitting: Gastroenterology

## 2019-06-19 ENCOUNTER — Other Ambulatory Visit: Payer: Self-pay

## 2019-06-19 VITALS — BP 143/87 | HR 76 | Temp 98.8°F | Ht 74.0 in | Wt 244.6 lb

## 2019-06-19 DIAGNOSIS — K6289 Other specified diseases of anus and rectum: Secondary | ICD-10-CM

## 2019-06-19 DIAGNOSIS — K602 Anal fissure, unspecified: Secondary | ICD-10-CM | POA: Diagnosis not present

## 2019-06-19 DIAGNOSIS — K625 Hemorrhage of anus and rectum: Secondary | ICD-10-CM

## 2019-06-19 NOTE — Progress Notes (Signed)
Gastroenterology Consultation  Referring Provider:     Valerie Roys, DO Primary Care Physician:  Valerie Roys, DO Primary Gastroenterologist:  Dr. Allen Norris     Reason for Consultation:     Change in bowel habits and rectal bleeding        HPI:   John Huang is a 44 y.o. y/o male referred for consultation & management of change in bowel habits rectal bleeding by Dr. Wynetta Emery, Megan P, DO.  This patient comes in today after being referred to me for a change in bowel habits and rectal bleeding.  The patient had an EGD and colonoscopy back in 2018 at [facility].  At that time the upper endoscopy was normal and the colonoscopy showed a anal fistula.  The patient had a previous colonoscopy 1 year prior with 2 polyps that were found and were adenomatous. The patient now comes in with the report of rectal bleeding that feels like the same when he had his fistula.  He reports that he has rectal pain and has been more constipated so he has been taking a stool softener.  The patient states that he has had multiple surgeries and has been put on narcotics in the past and does not want to get constipated due to his problems in the past with his fistulas and fissures.  Past Medical History:  Diagnosis Date  . Bulging lumbar disc   . CAP (community acquired pneumonia) 02/18/2016  . Cholinergic urticaria   . Epistaxis 05/24/2017  . 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 History of septic arthritis 2000   L knee  . Hypertension     Past Surgical History:  Procedure Laterality Date  . ANTERIOR CRUCIATE LIGAMENT REPAIR    . FOOT SURGERY  2020  . MEDIAL COLLATERAL LIGAMENT AND LATERAL COLLATERAL LIGAMENT REPAIR, KNEE    . SHOULDER SURGERY     dislocation and tac left in there, so had to have surgery to removed the tac  . TONSILLECTOMY  2020  . WISDOM TOOTH EXTRACTION      Prior to Admission medications   Medication Sig Start Date End Date Taking? Authorizing Provider  amLODipine (NORVASC) 5  MG tablet TAKE 1 TABLET (5 MG TOTAL) BY MOUTH DAILY. 06/12/19   Johnson, Megan P, DO  citalopram (CELEXA) 40 MG tablet Take 1 tablet (40 mg total) by mouth daily. 04/21/19 04/20/20  Park Liter P, DO  fexofenadine (ALLEGRA) 180 MG tablet Take 1-2 tablets (180-360 mg total) by mouth daily. 10/21/17   Johnson, Megan P, DO  meloxicam (MOBIC) 15 MG tablet Take 15 mg by mouth daily.    [provider]  Multiple Vitamin (MULTI-VITAMINS) TABS Take by mouth.    [provider]  naproxen (NAPROSYN) 500 MG tablet Take 1 tablet (500 mg total) by mouth 2 (two) times daily with a meal. 04/21/19   Valerie Roys, DO    Family History  Problem Relation Age of Onset  . Stroke Mother   . Hypertension Mother   . Diabetes Mother   . Cancer Father        Throat  . Cancer Sister        Eye/Brain  . Hypertension Brother   . Irritable bowel syndrome Brother      Social History   Tobacco Use  . Smoking status: Never Smoker  . Smokeless tobacco: Never Used  Substance Use Topics  . Alcohol use: Yes    Comment:  occasion  . Drug use: Never    Allergies as of 06/19/2019 - Review Complete 05/26/2019  Allergen Reaction Noted  . Cat hair extract Hives and Other (See Comments) 10/15/2015  . Galactose Other (See Comments) 05/20/2017    Review of Systems:    All systems reviewed and negative except where noted in HPI.   Physical Exam:  There were no vitals taken for this visit. No LMP for male patient. General:   Alert,  Well-developed, well-nourished, pleasant and cooperative in NAD Head:  Normocephalic and atraumatic. Eyes:  Sclera clear, no icterus.   Conjunctiva pink. Ears:  Normal auditory acuity. Rectal: Anal fissure noted at 12:00 towards the sacrum with tenderness to palpation.  Pulses:  Normal pulses noted. Extremities:  No clubbing or edema.  No cyanosis. Neurologic:  Alert and oriented x3;  grossly normal neurologically. Skin:  Intact without significant lesions or  rashes.  No jaundice. Lymph Nodes:  No significant cervical adenopathy. Psych:  Alert and cooperative. Normal mood and affect.  Imaging Studies: No results found.  Assessment and Plan:   John Huang is a 44 y.o. 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  The patient has been tried on nitroglycerin in the past and states that he did very poorly with it.  The patient will be started on diltiazem with lidocaine cream.  He has been told to try this for a few weeks and if it does not get better he may need to be sent to a surgeon for an anal myotomy.  The patient has been explained the plan and agrees with it.    Lucilla Lame, MD. Marval Regal    Note: This dictation was prepared with Dragon dictation along with smaller phrase technology. Any transcriptional errors that result from this process are unintentional.

## 2019-07-07 DIAGNOSIS — L501 Idiopathic urticaria: Secondary | ICD-10-CM | POA: Diagnosis not present

## 2019-08-10 DIAGNOSIS — M2550 Pain in unspecified joint: Secondary | ICD-10-CM | POA: Diagnosis not present

## 2019-08-10 DIAGNOSIS — R768 Other specified abnormal immunological findings in serum: Secondary | ICD-10-CM | POA: Diagnosis not present

## 2019-08-10 DIAGNOSIS — M8949 Other hypertrophic osteoarthropathy, multiple sites: Secondary | ICD-10-CM | POA: Diagnosis not present

## 2019-08-10 DIAGNOSIS — R61 Generalized hyperhidrosis: Secondary | ICD-10-CM | POA: Diagnosis not present

## 2019-08-10 DIAGNOSIS — M1991 Primary osteoarthritis, unspecified site: Secondary | ICD-10-CM | POA: Insufficient documentation

## 2019-08-10 DIAGNOSIS — R7681 Abnormal rheumatoid factor and anti-citrullinated protein antibody without rheumatoid arthritis: Secondary | ICD-10-CM | POA: Insufficient documentation

## 2019-08-11 ENCOUNTER — Encounter: Payer: Self-pay | Admitting: Family Medicine

## 2019-08-21 ENCOUNTER — Other Ambulatory Visit: Payer: Self-pay | Admitting: Family Medicine

## 2019-08-21 NOTE — Telephone Encounter (Signed)
Requested Prescriptions  Pending Prescriptions Disp Refills  . citalopram (CELEXA) 40 MG tablet [Pharmacy Med Name: CITALOPRAM HBR 40 MG TABLET 40 Tablet] 90 tablet 1    Sig: TAKE 1 TABLET BY MOUTH DAILY.     Psychiatry:  Antidepressants - SSRI Passed - 08/21/2019  3:02 PM      Passed - Valid encounter within last 6 months    Recent Outpatient Visits          2 months ago Arthralgia, unspecified joint   [place], [place], DO   4 months ago Routine general medical examination at a health care facility   [facility], [place] P, DO   10 months ago Acute bilateral low back pain with right-sided sciatica   [place], Megan P, DO   1 year ago OSA (obstructive sleep apnea)   [place], DO   1 year ago Cholinergic urticaria   [place], [place], DO

## 2019-08-24 MED ORDER — MELOXICAM 15 MG PO TABS: 15.0000 mg | ORAL_TABLET | Freq: Every day | ORAL | 3 refills | Status: DC

## 2019-08-31 DIAGNOSIS — L501 Idiopathic urticaria: Secondary | ICD-10-CM | POA: Diagnosis not present

## 2019-09-28 DIAGNOSIS — L501 Idiopathic urticaria: Secondary | ICD-10-CM | POA: Diagnosis not present

## 2019-11-09 DIAGNOSIS — L501 Idiopathic urticaria: Secondary | ICD-10-CM | POA: Diagnosis not present

## 2019-11-23 ENCOUNTER — Telehealth: Payer: 59 | Admitting: Emergency Medicine

## 2019-11-23 DIAGNOSIS — R05 Cough: Secondary | ICD-10-CM | POA: Diagnosis not present

## 2019-11-23 DIAGNOSIS — R059 Cough, unspecified: Secondary | ICD-10-CM

## 2019-11-23 MED ORDER — DOXYCYCLINE HYCLATE 100 MG PO CAPS: 100.0000 mg | ORAL_CAPSULE | Freq: Two times a day (BID) | ORAL | 0 refills | Status: DC

## 2019-11-23 MED ORDER — ALBUTEROL SULFATE HFA 108 (90 BASE) MCG/ACT IN AERS: 2.0000 | INHALATION_SPRAY | RESPIRATORY_TRACT | 0 refills | Status: DC | PRN

## 2019-11-23 MED ORDER — BENZONATATE 100 MG PO CAPS: 100.0000 mg | ORAL_CAPSULE | Freq: Two times a day (BID) | ORAL | 0 refills | Status: DC | PRN

## 2019-11-23 NOTE — Progress Notes (Signed)
We are sorry that you are not feeling well.  Here is how we plan to help!  Based on your presentation I believe you most likely have A cough due to bacteria.  When patients have a fever and a productive cough with a change in color or increased sputum production, we are concerned about bacterial bronchitis.  If left untreated it can progress to pneumonia.  If your symptoms do not improve with your treatment plan it is important that you contact your provider.   I have prescribed Doxycycline 100 mg twice a day for 7 days    If you don't begin having relief with the inhaler, or if you have worsening shortness of breath you should be seen in person, as you may need a chest x-ray.   In addition you may use A prescription cough medication called Tessalon Perles 100mg . You may take 1-2 capsules every 8 hours as needed for your cough.  I've also sent an inhaler to the pharmacy.  From your responses in the eVisit questionnaire you describe inflammation in the upper respiratory tract which is causing a significant cough.  This is commonly called Bronchitis and has four common causes:    Allergies  Viral Infections  Acid Reflux  Bacterial Infection Allergies, viruses and acid reflux are treated by controlling symptoms or eliminating the cause. An example might be a cough caused by taking certain blood pressure medications. You stop the cough by changing the medication. Another example might be a cough caused by acid reflux. Controlling the reflux helps control the cough.  USE OF BRONCHODILATOR ("RESCUE") INHALERS: There is a risk from using your bronchodilator too frequently.  The risk is that over-reliance on a medication which only relaxes the muscles surrounding the breathing tubes can reduce the effectiveness of medications prescribed to reduce swelling and congestion of the tubes themselves.  Although you feel brief relief from the bronchodilator inhaler, your asthma may actually be worsening with  the tubes becoming more swollen and filled with mucus.  This can delay other crucial treatments, such as oral steroid medications. If you need to use a bronchodilator inhaler daily, several times per day, you should discuss this with your provider.  There are probably better treatments that could be used to keep your asthma under control.     HOME CARE . Only take medications as instructed by your medical team. . Complete the entire course of an antibiotic. . Drink plenty of fluids and get plenty of rest. . Avoid close contacts especially the very young and the elderly . Cover your mouth if you cough or cough into your sleeve. . Always remember to wash your hands . A steam or ultrasonic humidifier can help congestion.   GET HELP RIGHT AWAY IF: . You develop worsening fever. . You become short of breath . You cough up blood. . Your symptoms persist after you have completed your treatment plan MAKE SURE YOU   Understand these instructions.  Will watch your condition.  Will get help right away if you are not doing well or get worse.  Your e-visit answers were reviewed by a board certified advanced clinical practitioner to complete your personal care plan.  Depending on the condition, your plan could have included both over the counter or prescription medications. If there is a problem please reply  once you have received a response from your provider. Your safety is important to [place].  If you have drug allergies check your prescription carefully.  You can use MyChart to ask questions about today's visit, request a non-urgent call back, or ask for a work or school excuse for 24 hours related to this e-Visit. If it has been greater than 24 hours you will need to follow up with your provider, or enter a new e-Visit to address those concerns. You will get an e-mail in the next two days asking about your experience.  I hope that your e-visit has been valuable and will speed your recovery. Thank  you for using e-visits.  Approximately 5 minutes was used in reviewing the patient's chart, questionnaire, prescribing medications, and documentation.

## 2019-11-24 ENCOUNTER — Encounter: Payer: Self-pay | Admitting: Family Medicine

## 2019-11-24 ENCOUNTER — Ambulatory Visit (INDEPENDENT_AMBULATORY_CARE_PROVIDER_SITE_OTHER): Payer: 59 | Admitting: Family Medicine

## 2019-11-27 NOTE — Progress Notes (Signed)
Patient had just been seen the day prior for his illness and did not realize he had kept this appointment. [place] this visit

## 2019-11-28 DIAGNOSIS — M25511 Pain in right shoulder: Secondary | ICD-10-CM | POA: Diagnosis not present

## 2019-12-05 ENCOUNTER — Encounter: Payer: Self-pay | Admitting: Family Medicine

## 2019-12-06 ENCOUNTER — Telehealth: Payer: Self-pay | Admitting: Family Medicine

## 2019-12-06 ENCOUNTER — Telehealth (INDEPENDENT_AMBULATORY_CARE_PROVIDER_SITE_OTHER): Payer: 59 | Admitting: Family Medicine

## 2019-12-06 ENCOUNTER — Encounter: Payer: Self-pay | Admitting: Family Medicine

## 2019-12-06 DIAGNOSIS — J0100 Acute maxillary sinusitis, unspecified: Secondary | ICD-10-CM | POA: Diagnosis not present

## 2019-12-06 MED ORDER — AMOXICILLIN-POT CLAVULANATE 875-125 MG PO TABS: 1.0000 | ORAL_TABLET | Freq: Two times a day (BID) | ORAL | 0 refills | Status: DC

## 2019-12-06 MED ORDER — PREDNISONE 50 MG PO TABS: 50.0000 mg | ORAL_TABLET | Freq: Every day | ORAL | 0 refills | Status: DC

## 2019-12-06 MED ORDER — BENZONATATE 100 MG PO CAPS: 100.0000 mg | ORAL_CAPSULE | Freq: Two times a day (BID) | ORAL | 2 refills | Status: DC | PRN

## 2019-12-06 MED ORDER — HYDROCOD POLST-CPM POLST ER 10-8 MG/5ML PO SUER: 5.0000 mL | Freq: Two times a day (BID) | ORAL | 0 refills | Status: DC | PRN

## 2019-12-06 NOTE — Progress Notes (Signed)
There were no vitals taken for this visit.   Subjective:    Patient ID: John Huang, male    DOB: [birth year], 44 y.o.   MRN: [id number]  HPI: John Huang is a 44 y.o. male  Chief Complaint  Patient presents with  . Cough  . mucus   UPPER RESPIRATORY TRACT INFECTION Duration: 3 weeks Worst symptom: cough, congestion Fever: no Cough: yes Shortness of breath: yes Wheezing: yes Chest pain: no Chest tightness: yes Chest congestion: yes Nasal congestion: yes Runny nose: yes Post nasal drip: yes Sneezing: yes Sore throat: no Swollen glands: no Sinus pressure: no Headache: no Face pain: no Toothache: no Ear pain: no  Ear pressure: no  Eyes red/itching:no Eye drainage/crusting: no  Vomiting: no Rash: no Fatigue: yes Sick contacts: yes Strep contacts: no  Context: worse Recurrent sinusitis: no Relief with OTC cold/cough medications: no  Treatments attempted: cold/sinus, mucinex, anti-histamine, pseudoephedrine and antibiotics   Relevant past medical, surgical, family and social history reviewed and updated as indicated. Interim medical history since our last visit reviewed. Allergies and medications reviewed and updated.  Review of Systems  Constitutional: Positive for fatigue. Negative for activity change, appetite change, chills, diaphoresis, fever and unexpected weight change.  HENT: Positive for congestion, postnasal drip, rhinorrhea and sinus pressure. Negative for dental problem, drooling, ear discharge, ear pain, facial swelling, hearing loss, mouth sores, nosebleeds, sinus pain, sneezing, sore throat, tinnitus, trouble swallowing and voice change.   Respiratory: Positive for apnea, cough, chest tightness, shortness of breath and wheezing. Negative for choking and stridor.   Cardiovascular: Negative.   Gastrointestinal: Negative.   Psychiatric/Behavioral: Negative.     Per HPI unless specifically indicated above     Objective:    There were no  vitals taken for this visit.  Wt Readings from Last 3 Encounters:  11/24/19 250 lb (113.4 kg)  06/19/19 244 lb 9.6 oz (110.9 kg)  04/21/19 242 lb 4 oz (109.9 kg)    Physical Exam Vitals and nursing note reviewed.  Constitutional:      General: He is not in acute distress.    Appearance: Normal appearance. He is not ill-appearing, toxic-appearing or diaphoretic.  HENT:     Head: Normocephalic and atraumatic.     Right Ear: External ear normal.     Left Ear: External ear normal.     Nose: Nose normal.     Mouth/Throat:     Mouth: Mucous membranes are moist.     Pharynx: Oropharynx is clear.  Eyes:     General: No scleral icterus.       Right eye: No discharge.        Left eye: No discharge.     Conjunctiva/sclera: Conjunctivae normal.     Pupils: Pupils are equal, round, and reactive to light.  Pulmonary:     Effort: Pulmonary effort is normal. No respiratory distress.     Comments: Speaking in full sentences, barking cough Musculoskeletal:        General: Normal range of motion.     Cervical back: Normal range of motion.  Skin:    Coloration: Skin is not jaundiced or pale.     Findings: No bruising, erythema, lesion or rash.  Neurological:     Mental Status: He is alert and oriented to person, place, and time. Mental status is at baseline.  Psychiatric:        Mood and Affect: Mood normal.        Behavior: Behavior  normal.        Thought Content: Thought content normal.        Judgment: Judgment normal.     Results for orders placed or performed in visit on 05/31/19  Rocky mtn spotted fvr abs pnl(IgG+IgM)  Result Value Ref Range   RMSF IgG Negative Negative   RMSF IgM 0.97 (H) 0.00 - 0.89 index  Lyme Ab/Western Blot Reflex  Result Value Ref Range   Lyme IgG/IgM Ab <0.91 0.00 - 0.90 ISR   LYME DISEASE AB, QUANT, IGM <0.80 0.00 - 0.79 index  Babesia microti Antibody Panel  Result Value Ref Range   Babesia microti IgM <1:10 Neg:<1:10   Babesia microti IgG <4:09  WJX:<9:14  Ehrlichia Antibody Panel  Result Value Ref Range   E.Chaffeensis (HME) IgG Negative Neg:<1:64   E. Chaffeensis (HME) IgM Titer Negative Neg:<1:20   HGE IgG Titer Negative Neg:<1:64   HGE IgM Titer Negative Neg:<1:20  RA Qn+CCP(IgG/A)+SjoSSA+SjoSSB  Result Value Ref Range   Rhuematoid fact SerPl-aCnc <10.0 0.0 - 13.9 IU/mL   ENA SSA (RO) Ab <0.2 0.0 - 0.9 AI   ENA SSB (LA) Ab <0.2 0.0 - 0.9 AI   Cyclic Citrullin Peptide Ab 21 (H) 0 - 19 units  Testosterone, free, total([facility])  Result Value Ref Range   Testosterone 256 (L) 264 - 916 ng/dL   Testosterone, Free 6.4 (L) 6.8 - 21.5 pg/mL   Sex Hormone Binding 21.1 16.5 - 55.9 nmol/L  Uric acid  Result Value Ref Range   Uric Acid 4.4 3.8 - 8.4 mg/dL  Sed Rate (ESR)  Result Value Ref Range   Sed Rate 20 (H) 0 - 15 mm/hr  Antinuclear Antib (ANA)  Result Value Ref Range   Anti Nuclear Antibody (ANA) Negative Negative  CK (Creatine Kinase)  Result Value Ref Range   Total CK 123 49.0 - 439.0 U/L      Assessment & Plan:   Problem List Items Addressed This Visit    None    Visit Diagnoses    Acute non-recurrent maxillary sinusitis    -  Primary   We will treat with prednisone and augmentin. Tussionex and tessalon for comfort. If not better by Monday, will obtain CXR. s/p covid vaccine.    Relevant Medications   amoxicillin-clavulanate (AUGMENTIN) 875-125 MG tablet   benzonatate (TESSALON) 100 MG capsule   chlorpheniramine-HYDROcodone (TUSSIONEX PENNKINETIC ER) 10-8 MG/5ML SUER   predniSONE (DELTASONE) 50 MG tablet       Follow up plan: No follow-ups on file.    . This visit was completed via MyChart due to the restrictions of the COVID-19 pandemic. All issues as above were discussed and addressed. Physical exam was done as above through visual confirmation on MyChart. If it was felt that the patient should be evaluated in the office, they were directed there. The patient verbally consented to this  visit. . Location of the patient: parking lot . Location of the provider: work . Those involved with this call:  . Provider: Park Liter, DO . CMA: Lauretta Grill, RMA . Front Desk/Registration: Don Perking  . Time spent on call: 15 minutes with patient face to face via video conference. More than 50% of this time was spent in counseling and coordination of care. 23 minutes total spent in review of patient's record and preparation of their chart.

## 2019-12-06 NOTE — Telephone Encounter (Signed)
There are issues getting his medicine to the pharmacy. Please call in his augmentin, prednisone and tessalon. He will need to come pick up the signed Rx for the tussionex.

## 2019-12-06 NOTE — Telephone Encounter (Signed)
All 3 prescriptions called in to [facility] in [place].   Tried calling patient to let him know that he would need to pick up Tussinex RX here at the office and that the others were called in. LVM asking for patient to please return my call.

## 2019-12-07 ENCOUNTER — Other Ambulatory Visit: Payer: Self-pay | Admitting: Family Medicine

## 2019-12-07 MED ORDER — HYDROCOD POLST-CPM POLST ER 10-8 MG/5ML PO SUER: 5.0000 mL | Freq: Two times a day (BID) | ORAL | 0 refills | Status: DC | PRN

## 2019-12-07 NOTE — Progress Notes (Signed)
John Huang

## 2019-12-29 ENCOUNTER — Other Ambulatory Visit: Payer: Self-pay | Admitting: Family Medicine

## 2020-01-18 ENCOUNTER — Other Ambulatory Visit: Payer: Self-pay

## 2020-01-18 ENCOUNTER — Encounter: Payer: Self-pay | Admitting: Family Medicine

## 2020-01-18 ENCOUNTER — Ambulatory Visit
Admission: RE | Admit: 2020-01-18 | Discharge: 2020-01-18 | Disposition: A | Discharge status: Home / Self Care | Payer: 59 | Attending: Family Medicine | Admitting: Family Medicine

## 2020-01-18 ENCOUNTER — Ambulatory Visit (INDEPENDENT_AMBULATORY_CARE_PROVIDER_SITE_OTHER): Payer: 59 | Admitting: Family Medicine

## 2020-01-18 ENCOUNTER — Ambulatory Visit
Admission: RE | Admit: 2020-01-18 | Discharge: 2020-01-18 | Disposition: A | Discharge status: Home / Self Care | Payer: 59 | Source: Ambulatory Visit | Attending: Family Medicine | Admitting: Family Medicine

## 2020-01-18 VITALS — BP 138/92 | HR 89 | Temp 99.2°F

## 2020-01-18 DIAGNOSIS — M545 Low back pain, unspecified: Secondary | ICD-10-CM

## 2020-01-18 DIAGNOSIS — Z23 Encounter for immunization: Secondary | ICD-10-CM

## 2020-01-18 LAB — URINALYSIS, ROUTINE W REFLEX MICROSCOPIC
Bilirubin, UA: NEGATIVE
Glucose, UA: NEGATIVE
Ketones, UA: NEGATIVE
Leukocytes,UA: NEGATIVE
Nitrite, UA: NEGATIVE
Protein,UA: NEGATIVE
Specific Gravity, UA: 1.02 (ref 1.005–1.030)
Urobilinogen, Ur: 0.2 mg/dL (ref 0.2–1.0)
pH, UA: 6.5 (ref 5.0–7.5)

## 2020-01-18 LAB — MICROSCOPIC EXAMINATION
Bacteria, UA: NONE SEEN
WBC, UA: NONE SEEN /hpf (ref 0–5)

## 2020-01-18 IMAGING — CR DG LUMBAR SPINE COMPLETE 4+V
7 series · 7 of 7 positions shown · non-contrast
Comparison: None.

CLINICAL DATA: Low back pain radiating to the right leg and thigh.

EXAM:
LUMBAR SPINE - COMPLETE 4+ VIEW

[l-spine ap (1 of 2)]
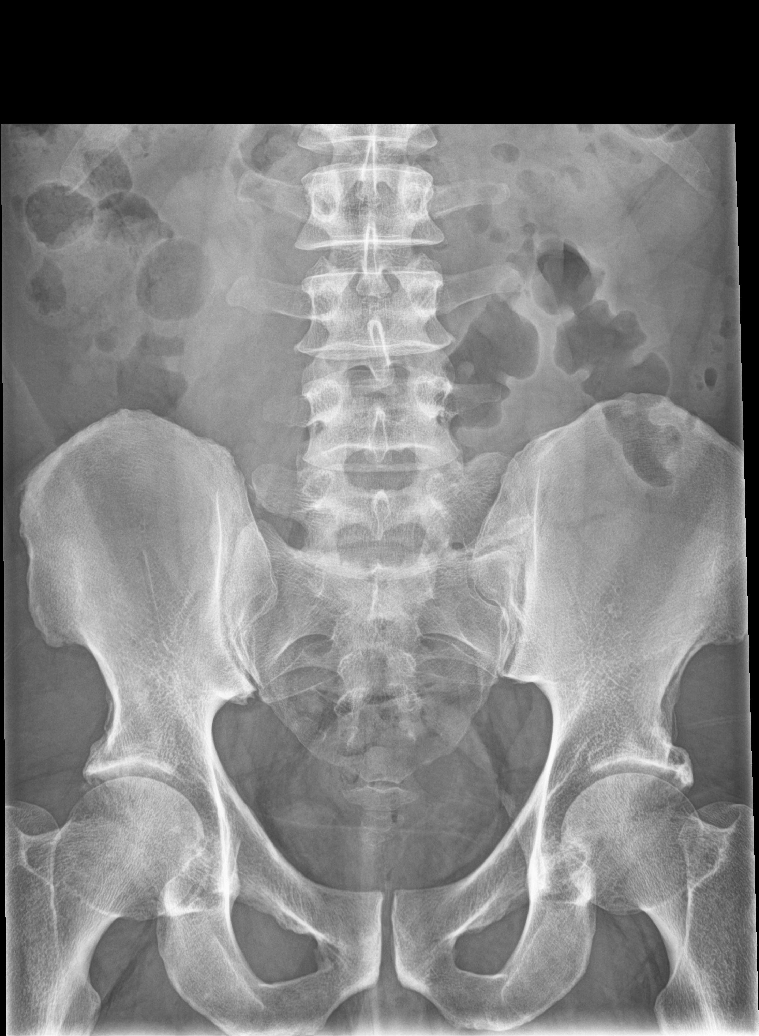

[l-spine obl (1 of 3)]
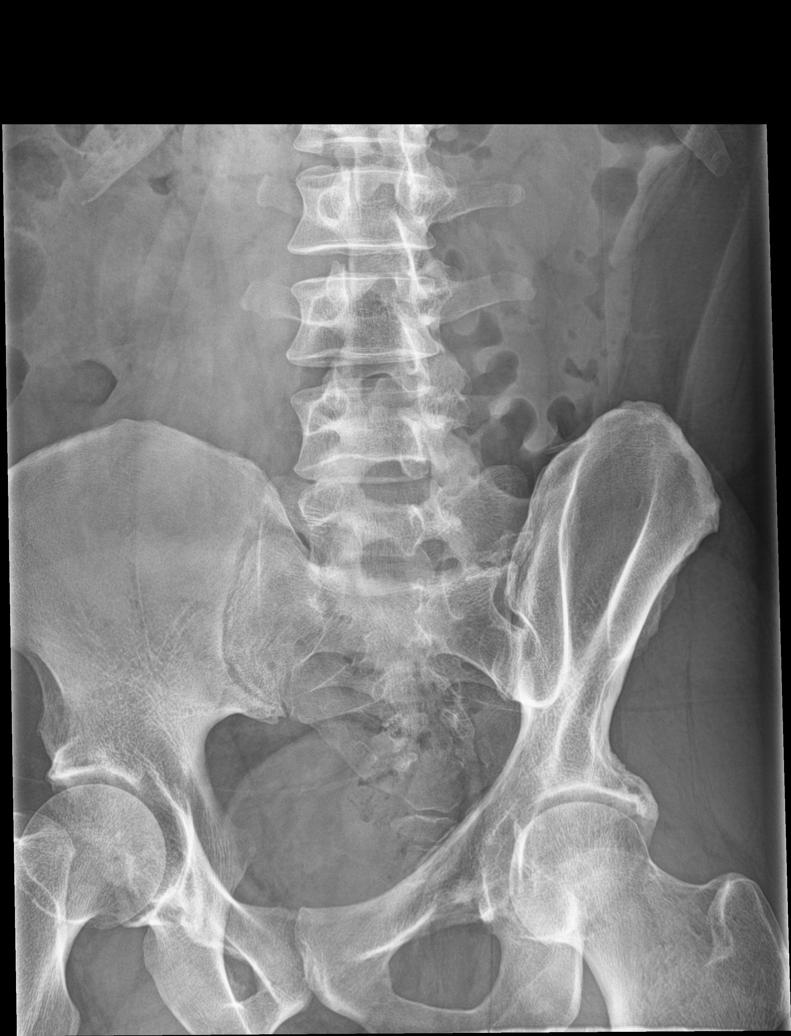

[l-spine obl (2 of 3)]
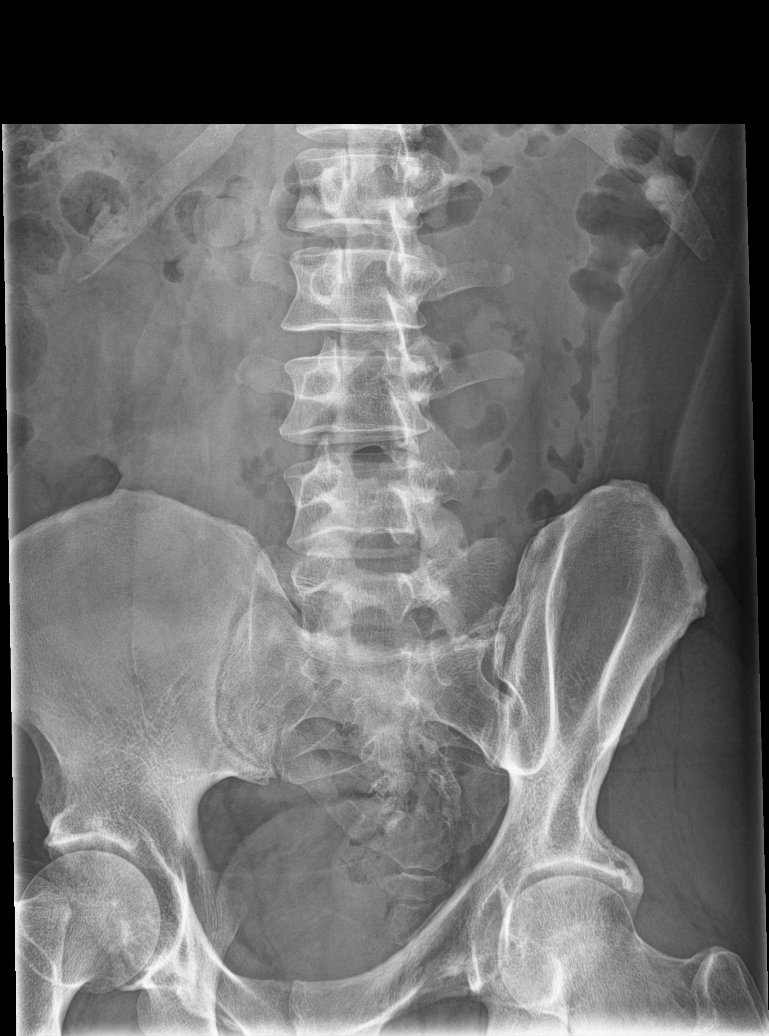

[l-spine lat]
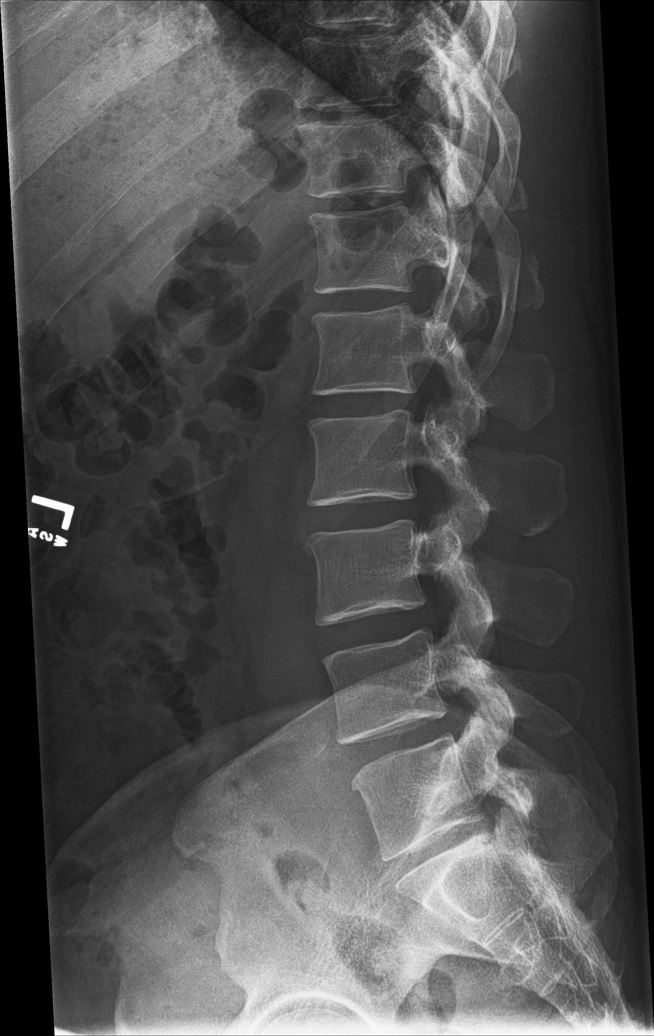

[l-spine spot]
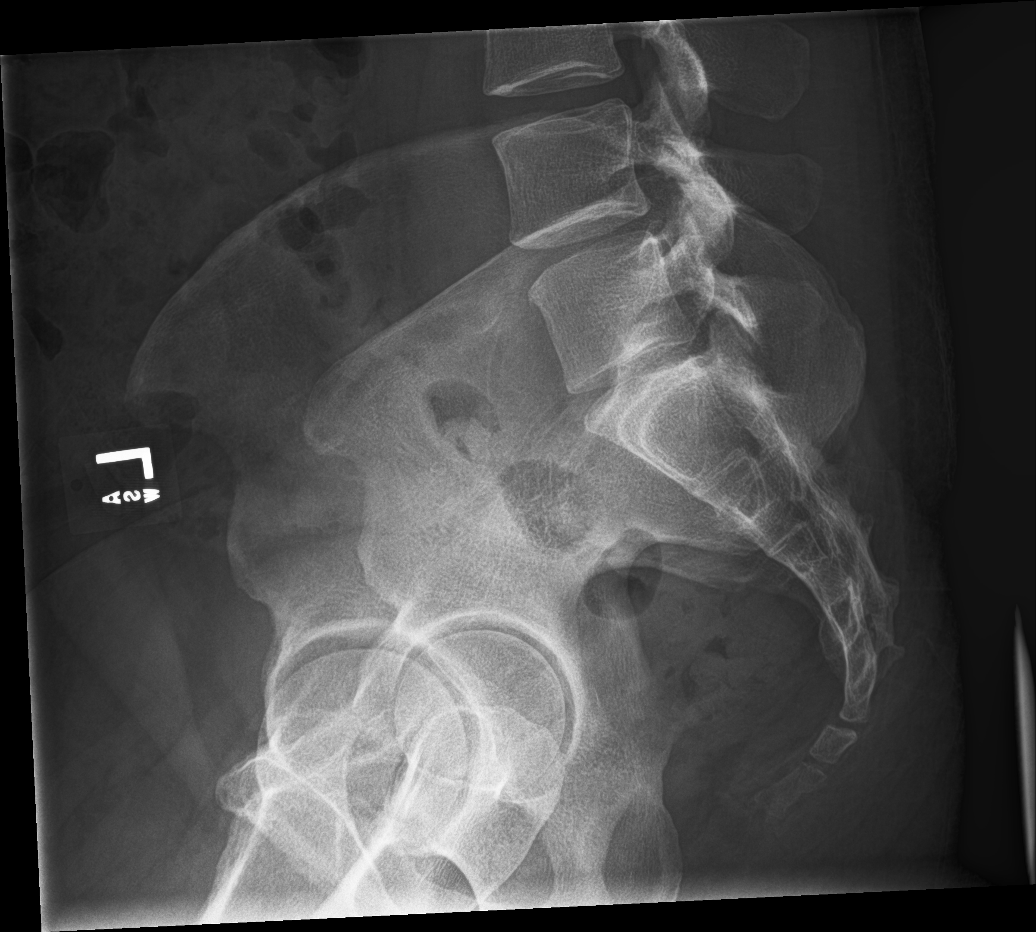

[l-spine obl (3 of 3)]
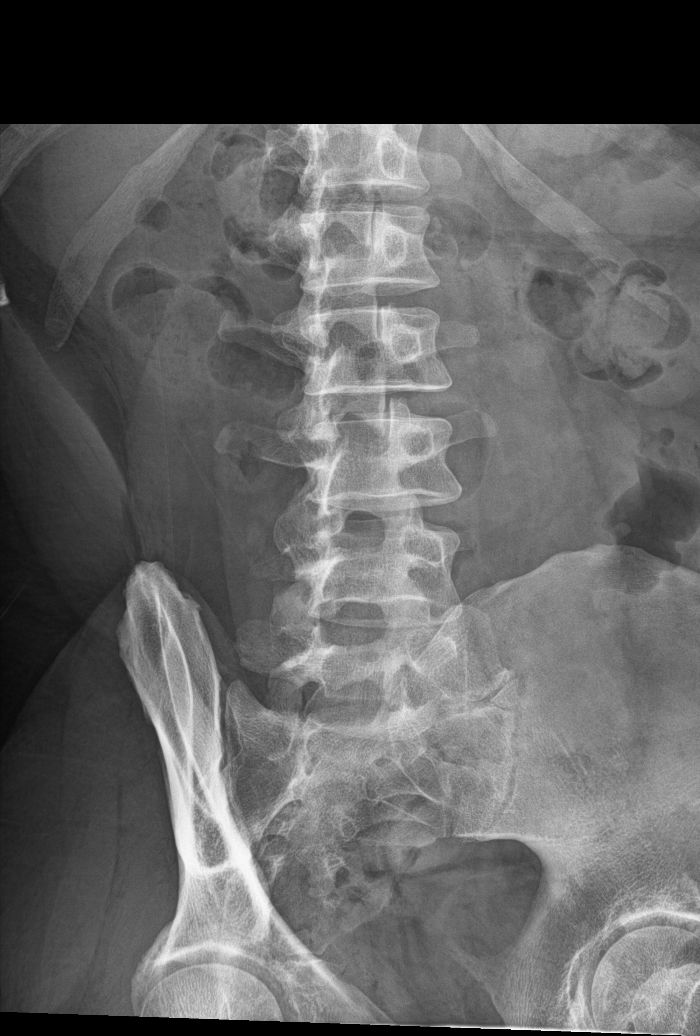

[l-spine ap (2 of 2)]
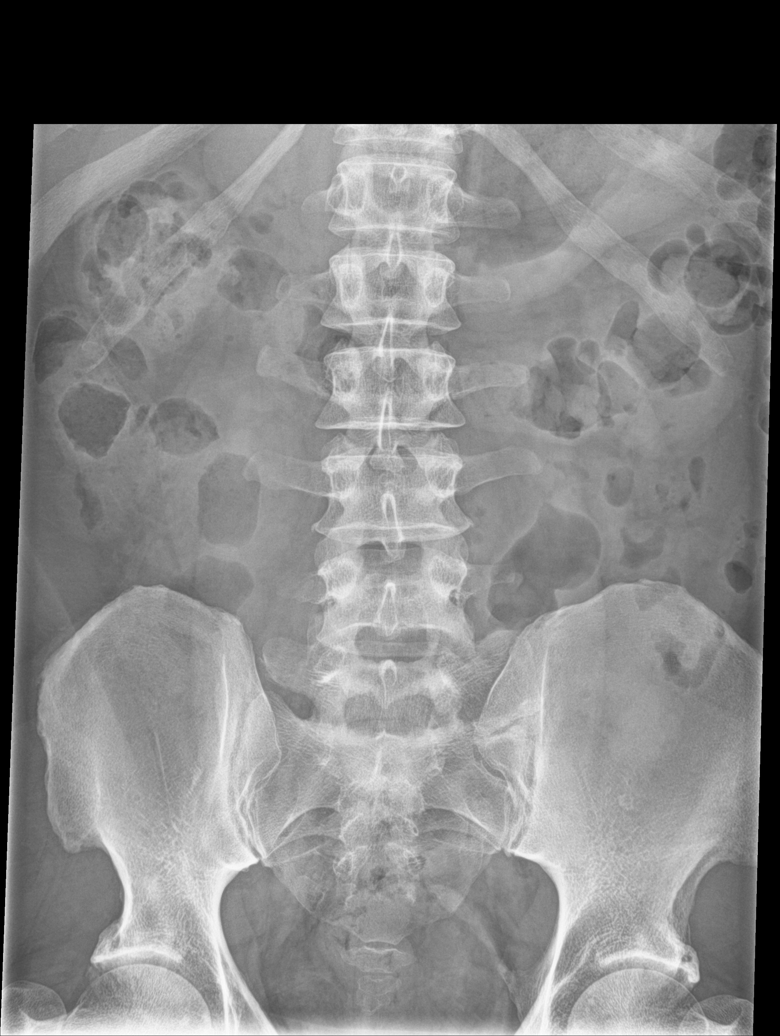

[7 of 7 positions shown; findings below may reference images not displayed]

FINDINGS: Five lumbar type vertebral bodies. Transitional S1 with anomalous
articulation on the left. Mild disc space narrowing L5-S1. No facet
arthropathy. Regional soft tissues appear unremarkable.
IMPRESSION: No acute finding. Transitional S1 with anomalous articulation on the
left. Mild disc space narrowing L5-S1. No advanced finding.

## 2020-01-18 MED ORDER — PREDNISONE 50 MG PO TABS
50.0000 mg | ORAL_TABLET | Freq: Every day | ORAL | 0 refills | Status: DC
Start: 2020-01-18 — End: 2020-03-21

## 2020-01-18 MED ORDER — TIZANIDINE HCL 2 MG PO TABS
4.0000 mg | ORAL_TABLET | Freq: Three times a day (TID) | ORAL | 1 refills | Status: DC
Start: 2020-01-18 — End: 2020-03-21

## 2020-01-18 NOTE — Patient Instructions (Signed)
Sciatica Rehab Ask your health care provider which exercises are safe for you. Do exercises exactly as told by your health care provider and adjust them as directed. It is normal to feel mild stretching, pulling, tightness, or discomfort as you do these exercises. Stop right away if you feel sudden pain or your pain gets worse. Do not begin these exercises until told by your health care provider. Stretching and range-of-motion exercises These exercises warm up your muscles and joints and improve the movement and flexibility of your hips and back. These exercises also help to relieve pain, numbness, and tingling. Sciatic nerve glide 1. Sit in a chair with your head facing down toward your chest. Place your hands behind your back. Let your shoulders slump forward. 2. Slowly straighten one of your legs while you tilt your head back as if you are looking toward the ceiling. Only straighten your leg as far as you can without making your symptoms worse. 3. Hold this position for __________ seconds. 4. Slowly return to the starting position. 5. Repeat with your other leg. Repeat __________ times. Complete this exercise __________ times a day. Knee to chest with hip adduction and internal rotation  1. Lie on your back on a firm surface with both legs straight. 2. Bend one of your knees and move it up toward your chest until you feel a gentle stretch in your lower back and buttock. Then, move your knee toward the shoulder that is on the opposite side from your leg. This is hip adduction and internal rotation. ? Hold your leg in this position by holding on to the front of your knee. 3. Hold this position for __________ seconds. 4. Slowly return to the starting position. 5. Repeat with your other leg. Repeat __________ times. Complete this exercise __________ times a day. Prone extension on elbows  1. Lie on your abdomen on a firm surface. A bed may be too soft for this exercise. 2. Prop yourself up on  your elbows. 3. Use your arms to help lift your chest up until you feel a gentle stretch in your abdomen and your lower back. ? This will place some of your body weight on your elbows. If this is uncomfortable, try stacking pillows under your chest. ? Your hips should stay down, against the surface that you are lying on. Keep your hip and back muscles relaxed. 4. Hold this position for __________ seconds. 5. Slowly relax your upper body and return to the starting position. Repeat __________ times. Complete this exercise __________ times a day. Strengthening exercises These exercises build strength and endurance in your back. Endurance is the ability to use your muscles for a long time, even after they get tired. Pelvic tilt This exercise strengthens the muscles that lie deep in the abdomen. 1. Lie on your back on a firm surface. Bend your knees and keep your feet flat on the floor. 2. Tense your abdominal muscles. Tip your pelvis up toward the ceiling and flatten your lower back into the floor. ? To help with this exercise, you may place a small towel under your lower back and try to push your back into the towel. 3. Hold this position for __________ seconds. 4. Let your muscles relax completely before you repeat this exercise. Repeat __________ times. Complete this exercise __________ times a day. Alternating arm and leg raises  1. Get on your hands and knees on a firm surface. If you are on a hard floor, you may want to use   padding, such as an exercise mat, to cushion your knees. 2. Line up your arms and legs. Your hands should be directly below your shoulders, and your knees should be directly below your hips. 3. Lift your left leg behind you. At the same time, raise your right arm and straighten it in front of you. ? Do not lift your leg higher than your hip. ? Do not lift your arm higher than your shoulder. ? Keep your abdominal and back muscles tight. ? Keep your hips facing the  ground. ? Do not arch your back. ? Keep your balance carefully, and do not hold your breath. 4. Hold this position for __________ seconds. 5. Slowly return to the starting position. 6. Repeat with your right leg and your left arm. Repeat __________ times. Complete this exercise __________ times a day. Posture and body mechanics Good posture and healthy body mechanics can help to relieve stress in your body's tissues and joints. Body mechanics refers to the movements and positions of your body while you do your daily activities. Posture is part of body mechanics. Good posture means:  Your spine is in its natural S-curve position (neutral).  Your shoulders are pulled back slightly.  Your head is not tipped forward. Follow these guidelines to improve your posture and body mechanics in your everyday activities. Standing   When standing, keep your spine neutral and your feet about hip width apart. Keep a slight bend in your knees. Your ears, shoulders, and hips should line up.  When you do a task in which you stand in one place for a long time, place one foot up on a stable object that is 2-4 inches (5-10 cm) high, such as a footstool. This helps keep your spine neutral. Sitting   When sitting, keep your spine neutral and keep your feet flat on the floor. Use a footrest, if necessary, and keep your thighs parallel to the floor. Avoid rounding your shoulders, and avoid tilting your head forward.  When working at a desk or a computer, keep your desk at a height where your hands are slightly lower than your elbows. Slide your chair under your desk so you are close enough to maintain good posture.  When working at a computer, place your monitor at a height where you are looking straight ahead and you do not have to tilt your head forward or downward to look at the screen. Resting  When lying down and resting, avoid positions that are most painful for you.  If you have pain with activities  such as sitting, bending, stooping, or squatting, lie in a position in which your body does not bend very much. For example, avoid curling up on your side with your arms and knees near your chest (fetal position).  If you have pain with activities such as standing for a long time or reaching with your arms, lie with your spine in a neutral position and bend your knees slightly. Try the following positions: ? Lying on your side with a pillow between your knees. ? Lying on your back with a pillow under your knees. Lifting   When lifting objects, keep your feet at least shoulder width apart and tighten your abdominal muscles.  Bend your knees and hips and keep your spine neutral. It is important to lift using the strength of your legs, not your back. Do not lock your knees straight out.  Always ask for help to lift heavy or awkward objects. This information is not   intended to replace advice given to you by your health care provider. Make sure you discuss any questions you have with your health care provider. Document Revised: 08/26/2018 Document Reviewed: 05/26/2018 Elsevier Patient Education  2020 [employer]  

## 2020-01-18 NOTE — Progress Notes (Signed)
BP (!) 138/92 (BP Location: Left Arm, Patient Position: Sitting, Cuff Size: Normal)   Pulse 89   Temp 99.2 F (37.3 C) (Oral)   SpO2 98%    Subjective:    Patient ID: John Huang, male    DOB: Nov 02, 1975, 44 y.o.   MRN: [id number]  HPI: John Huang is a 44 y.o. male  Chief Complaint  Patient presents with  . Back Pain   BACK PAIN Duration: 2 weeks Mechanism of injury: lifting Location: bilateral and low back Onset: sudden Severity: severe Quality: aching Frequency: constant Radiation: R leg above the knee Aggravating factors: lifting, movement and walking Alleviating factors: nothing Status: stable Treatments attempted: inversion table, dry needling, rest, ice, heat, APAP, ibuprofen, aleve, physical therapy and HEP  Relief with NSAIDs?: mild Nighttime pain:  yes Paresthesias / decreased sensation:  no Bowel / bladder incontinence:  no Fevers:  no Dysuria / urinary frequency:  no  Relevant past medical, surgical, family and social history reviewed and updated as indicated. Interim medical history since our last visit reviewed. Allergies and medications reviewed and updated.  Review of Systems  Constitutional: Negative.   Respiratory: Negative.   Cardiovascular: Negative.   Gastrointestinal: Negative.   Musculoskeletal: Positive for back pain and myalgias. Negative for arthralgias, gait problem, joint swelling, neck pain and neck stiffness.  Skin: Negative.   Neurological: Positive for weakness and numbness. Negative for dizziness, tremors, seizures, syncope, facial asymmetry, speech difficulty, light-headedness and headaches.  Psychiatric/Behavioral: Negative.     Per HPI unless specifically indicated above     Objective:    BP (!) 138/92 (BP Location: Left Arm, Patient Position: Sitting, Cuff Size: Normal)   Pulse 89   Temp 99.2 F (37.3 C) (Oral)   SpO2 98%   Wt Readings from Last 3 Encounters:  11/24/19 250 lb (113.4 kg)  06/19/19 244 lb 9.6  oz (110.9 kg)  04/21/19 242 lb 4 oz (109.9 kg)    Physical Exam Vitals and nursing note reviewed.  Constitutional:      General: He is not in acute distress.    Appearance: Normal appearance. He is not ill-appearing, toxic-appearing or diaphoretic.  HENT:     Head: Normocephalic and atraumatic.     Right Ear: External ear normal.     Left Ear: External ear normal.     Nose: Nose normal.     Mouth/Throat:     Mouth: Mucous membranes are moist.     Pharynx: Oropharynx is clear.  Eyes:     General: No scleral icterus.       Right eye: No discharge.        Left eye: No discharge.     Extraocular Movements: Extraocular movements intact.     Conjunctiva/sclera: Conjunctivae normal.     Pupils: Pupils are equal, round, and reactive to light.  Cardiovascular:     Rate and Rhythm: Normal rate and regular rhythm.     Pulses: Normal pulses.     Heart sounds: Normal heart sounds. No murmur heard.  No friction rub. No gallop.   Pulmonary:     Effort: Pulmonary effort is normal. No respiratory distress.     Breath sounds: Normal breath sounds. No stridor. No wheezing, rhonchi or rales.  Chest:     Chest wall: No tenderness.  Musculoskeletal:     Cervical back: Normal range of motion and neck supple.  Skin:    General: Skin is warm and dry.     Capillary  Refill: Capillary refill takes less than 2 seconds.     Coloration: Skin is not jaundiced or pale.     Findings: No bruising, erythema, lesion or rash.  Neurological:     General: No focal deficit present.     Mental Status: He is alert and oriented to person, place, and time. Mental status is at baseline.  Psychiatric:        Mood and Affect: Mood normal.        Behavior: Behavior normal.        Thought Content: Thought content normal.        Judgment: Judgment normal.     Results for orders placed or performed in visit on 05/31/19  Rocky mtn spotted fvr abs pnl(IgG+IgM)  Result Value Ref Range   RMSF IgG Negative Negative    RMSF IgM 0.97 (H) 0.00 - 0.89 index  Lyme Ab/Western Blot Reflex  Result Value Ref Range   Lyme IgG/IgM Ab <0.91 0.00 - 0.90 ISR   LYME DISEASE AB, QUANT, IGM <0.80 0.00 - 0.79 index  Babesia microti Antibody Panel  Result Value Ref Range   Babesia microti IgM <1:10 Neg:<1:10   Babesia microti IgG <0:24 OXB:<3:53  Ehrlichia Antibody Panel  Result Value Ref Range   E.Chaffeensis (HME) IgG Negative Neg:<1:64   E. Chaffeensis (HME) IgM Titer Negative Neg:<1:20   HGE IgG Titer Negative Neg:<1:64   HGE IgM Titer Negative Neg:<1:20  RA Qn+CCP(IgG/A)+SjoSSA+SjoSSB  Result Value Ref Range   Rhuematoid fact SerPl-aCnc <10.0 0.0 - 13.9 IU/mL   ENA SSA (RO) Ab <0.2 0.0 - 0.9 AI   ENA SSB (LA) Ab <0.2 0.0 - 0.9 AI   Cyclic Citrullin Peptide Ab 21 (H) 0 - 19 units  Testosterone, free, total([facility])  Result Value Ref Range   Testosterone 256 (L) 264 - 916 ng/dL   Testosterone, Free 6.4 (L) 6.8 - 21.5 pg/mL   Sex Hormone Binding 21.1 16.5 - 55.9 nmol/L  Uric acid  Result Value Ref Range   Uric Acid 4.4 3.8 - 8.4 mg/dL  Sed Rate (ESR)  Result Value Ref Range   Sed Rate 20 (H) 0 - 15 mm/hr  Antinuclear Antib (ANA)  Result Value Ref Range   Anti Nuclear Antibody (ANA) Negative Negative  CK (Creatine Kinase)  Result Value Ref Range   Total CK 123 49.0 - 439.0 U/L      Assessment & Plan:   Problem List Items Addressed This Visit    None    Visit Diagnoses    Acute low back pain, unspecified back pain laterality, unspecified whether sciatica present    -  Primary   Will get x-ray to look for cause. Will treat with prednisone burst and tizianidine. Follow up 2 weeks. Call with any concerns.    Relevant Medications   tiZANidine (ZANAFLEX) 2 MG tablet   predniSONE (DELTASONE) 50 MG tablet   Other Relevant Orders   Urinalysis, Routine w reflex microscopic   DG Lumbar Spine Complete       Follow up plan: Return in about 2 weeks (around 02/01/2020).

## 2020-01-23 ENCOUNTER — Encounter: Payer: Self-pay | Admitting: Family Medicine

## 2020-01-23 DIAGNOSIS — M4726 Other spondylosis with radiculopathy, lumbar region: Secondary | ICD-10-CM

## 2020-01-23 DIAGNOSIS — M545 Low back pain, unspecified: Secondary | ICD-10-CM

## 2020-02-01 ENCOUNTER — Encounter: Payer: Self-pay | Admitting: Family Medicine

## 2020-02-01 DIAGNOSIS — M25511 Pain in right shoulder: Secondary | ICD-10-CM | POA: Diagnosis not present

## 2020-02-01 DIAGNOSIS — M5416 Radiculopathy, lumbar region: Secondary | ICD-10-CM | POA: Diagnosis not present

## 2020-02-01 DIAGNOSIS — M545 Low back pain, unspecified: Secondary | ICD-10-CM

## 2020-02-01 DIAGNOSIS — M4726 Other spondylosis with radiculopathy, lumbar region: Secondary | ICD-10-CM

## 2020-02-29 ENCOUNTER — Other Ambulatory Visit: Payer: Self-pay

## 2020-02-29 ENCOUNTER — Ambulatory Visit
Admission: RE | Admit: 2020-02-29 | Discharge: 2020-02-29 | Disposition: A | Discharge status: Home / Self Care | Payer: 59 | Source: Ambulatory Visit | Attending: Family Medicine | Admitting: Family Medicine

## 2020-02-29 DIAGNOSIS — M545 Low back pain, unspecified: Secondary | ICD-10-CM | POA: Insufficient documentation

## 2020-02-29 DIAGNOSIS — M4726 Other spondylosis with radiculopathy, lumbar region: Secondary | ICD-10-CM | POA: Diagnosis not present

## 2020-02-29 IMAGING — MR MR LUMBAR SPINE W/O CM
5 series · 31 of 48 positions shown · non-contrast
Comparison: Lumbar radiographs [DATE]

CLINICAL DATA: Lumbar radiculopathy. Slipped disc in [EJ]. Pain in
right buttock and right leg.

EXAM:
MRI LUMBAR SPINE WITHOUT CONTRAST
TECHNIQUE: Multiplanar, multisequence MR imaging of the lumbar spine was
performed. No intravenous contrast was administered.

[Series 5: T2 · sagittal · 4.0mm · 0.81mm/px · 7 of 17 slices shown (1 of 2)]
[im 1/17]
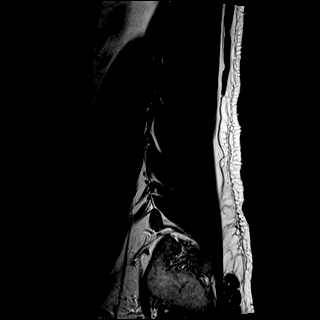
[im 3/17]
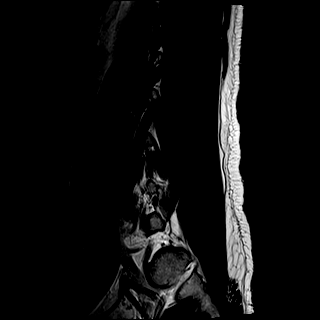
[im 6/17]
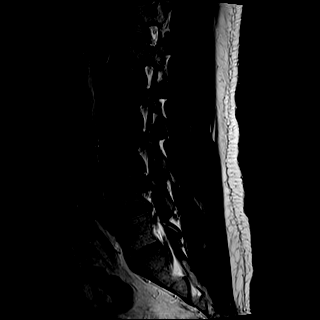
[im 9/17]
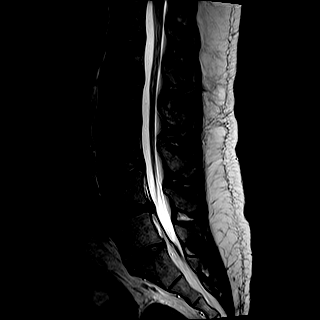
[im 11/17]
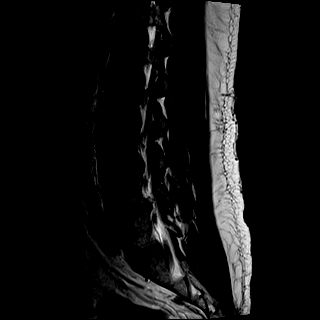
[im 14/17]
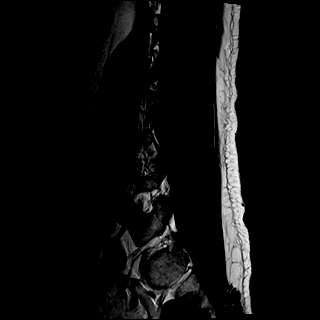
[im 17/17]
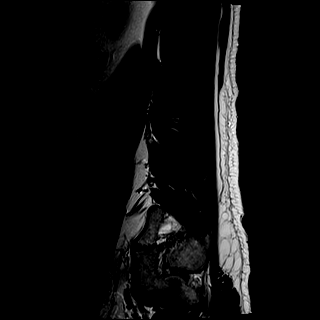

[Series 6: T1 · sagittal · 4.0mm · 0.81mm/px · 7 of 17 slices shown (1 of 2)]
[im 1/17]
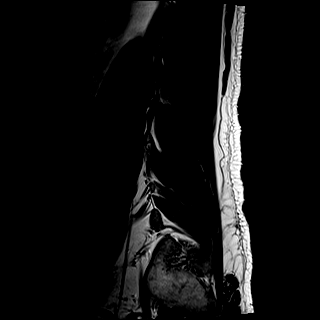
[im 3/17]
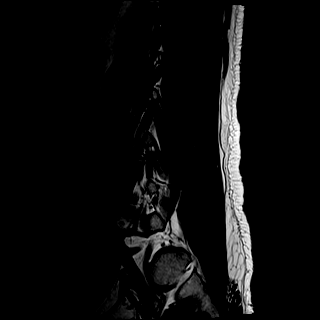
[im 6/17]
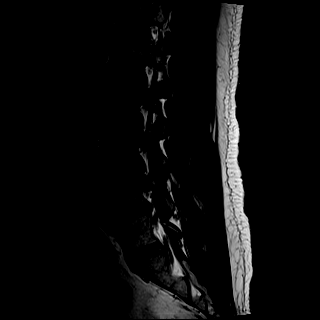
[im 9/17]
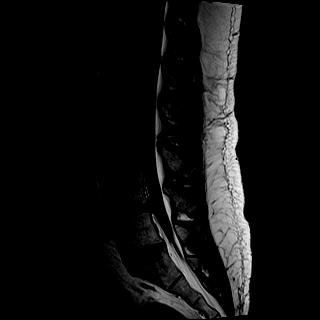
[im 11/17]
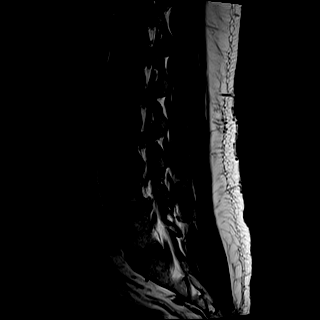
[im 14/17]
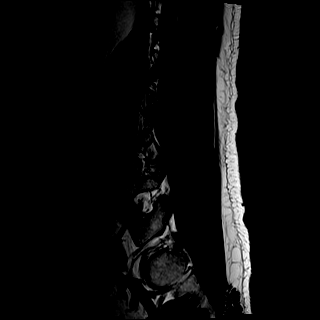
[im 17/17]
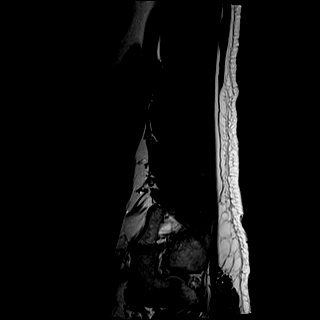

[Series 7: STIR · sagittal · 4.0mm · 0.41mm/px · 1 of 17 slices shown]
[im 1/17]
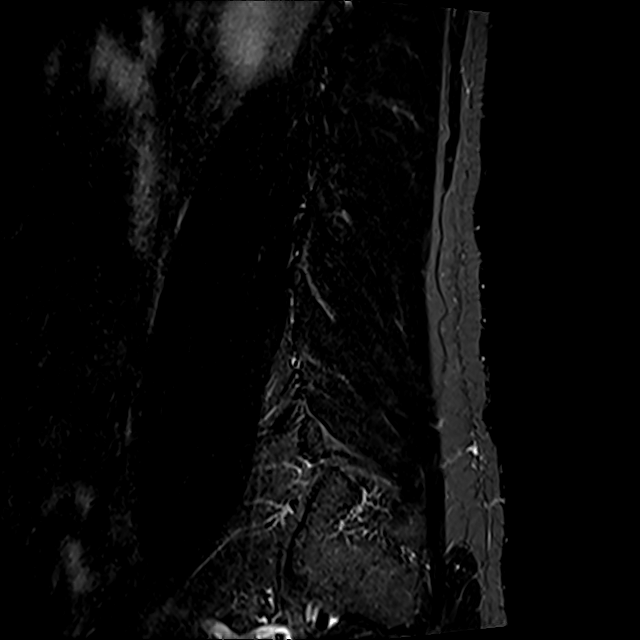

[Series 8: T2 · axial · 4.0mm · 0.78mm/px · z∈[-55,+165]mm · 8 of 38 slices shown (2 of 2)]
[im 1/38]
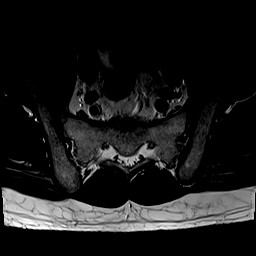
[im 6/38]
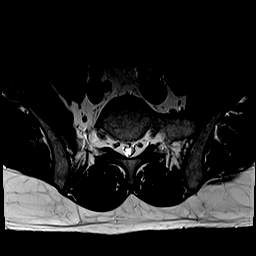
[im 12/38]
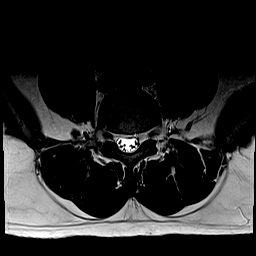
[im 18/38]
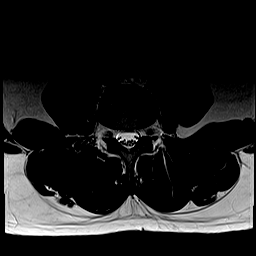
[im 20/38]
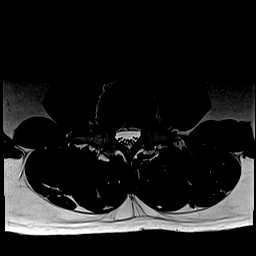
[im 26/38]
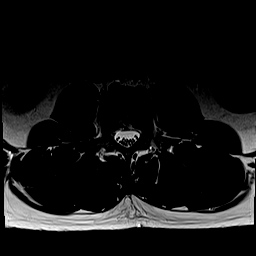
[im 32/38]
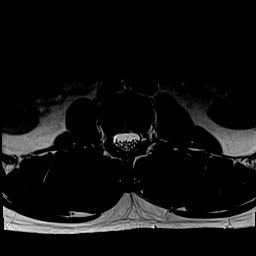
[im 38/38]
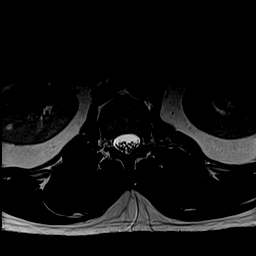

[Series 9: T1 · axial · 4.0mm · 0.39mm/px · z∈[-55,+165]mm · 8 of 38 slices shown (2 of 2)]
[im 1/38]
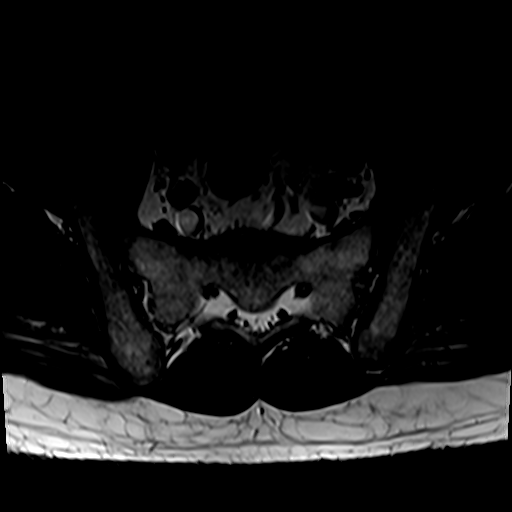
[im 6/38]
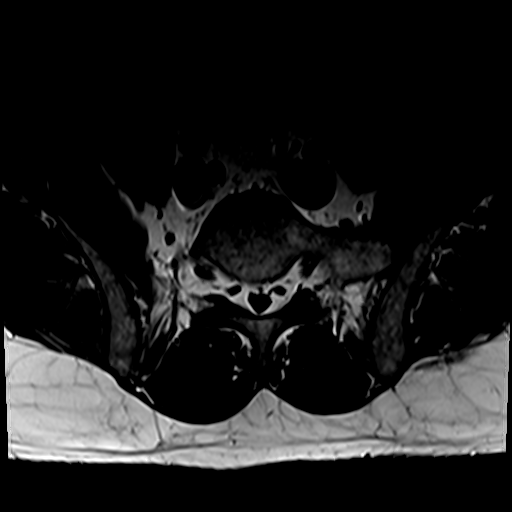
[im 12/38]
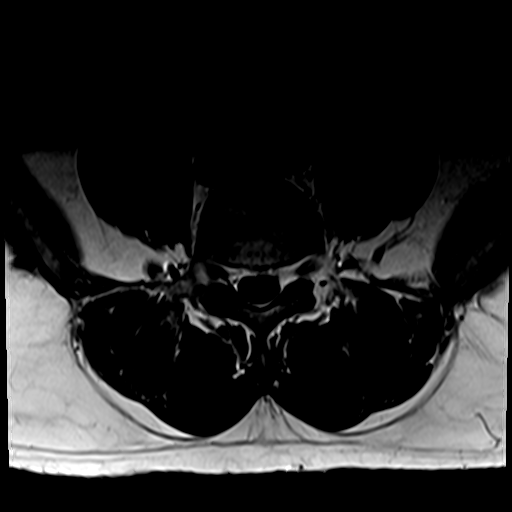
[im 18/38]
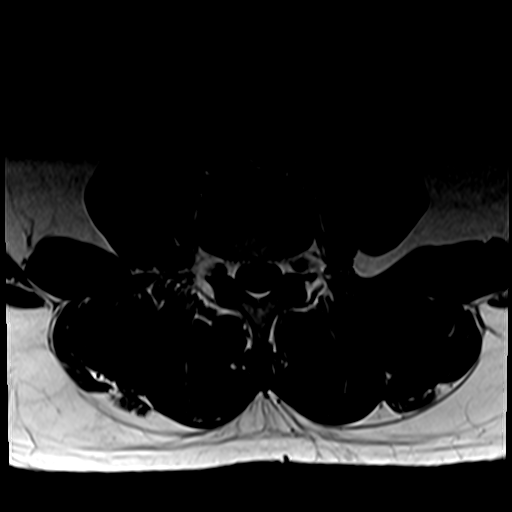
[im 20/38]
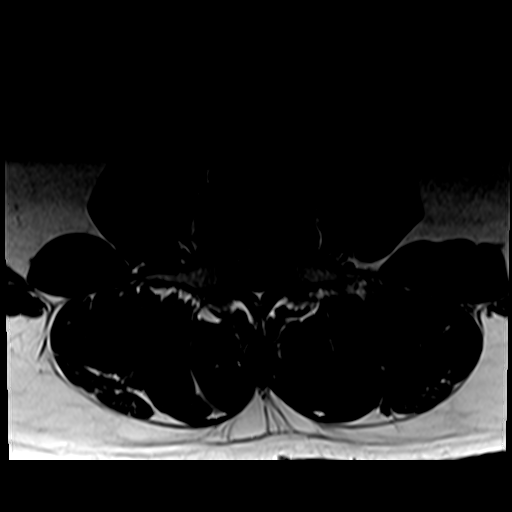
[im 26/38]
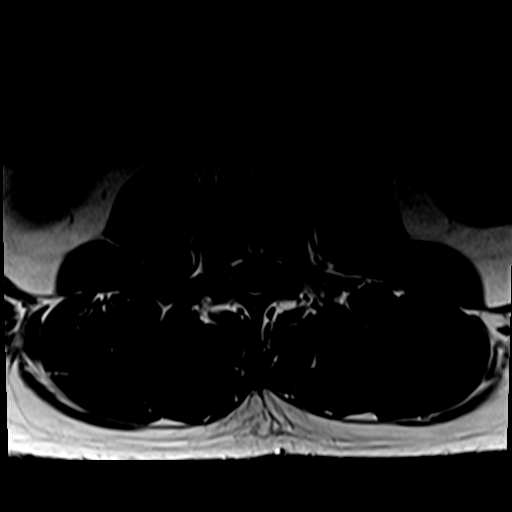
[im 32/38]
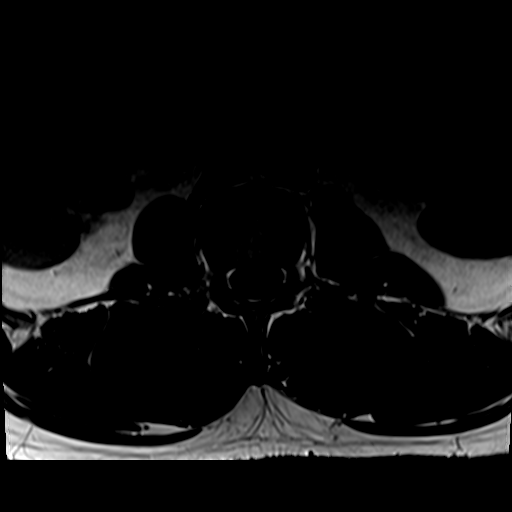
[im 38/38]
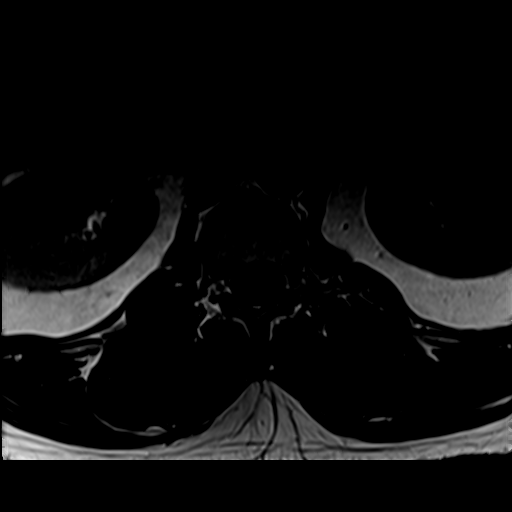

[31 of 48 positions shown; findings below may reference images not displayed]

FINDINGS: Segmentation: Transitional lumbosacral anatomy. For the purposes of
this dictation and to stay consistent with the prior radiograph
dictation, there is lumbarization of S1 and pseudoarticulation of
the left transverse process of S1 with the sacrum. The the
inferior-most fully formed intervertebral disc is labeled S1-S2.

Alignment:  Normal.

Vertebrae: No no focal marrow signal abnormality to suggest acute
fracture, discitis/osteomyelitis, or suspicious bone lesion.

Conus medullaris and cauda equina: Conus extends to the L1 level.
Conus appears normal.

Paraspinal and other soft tissues: Negative.

Disc levels:

Degenerative disc disease at L5-S1 and S1-S2.

L1-L2: No significant disc protrusion, foraminal stenosis, or canal
stenosis.

L2-L3: Minimal broad-based disc bulge and mild bilateral facet
hypertrophy without significant canal or foraminal stenosis.

L3-L4: Small broad-based disc bulge and mild bilateral facet
hypertrophy with mild bilateral subarticular recess stenosis. No
significant canal or foraminal stenosis.

L4-L5: Small broad-based disc bulge and mild bilateral facet
hypertrophy with mild bilateral subarticular recess stenosis. No
significant canal or foraminal stenosis.

L5-S1: Degenerative disc desiccation height loss. Moderate
broad-based disc bulge with posterior annular fissure. Mild
bilateral facet hypertrophy. Resulting mild canal stenosis and
moderate bilateral subarticular recess stenosis with disc contacting
bilateral descending L5 nerve roots.

S1-S2: No significant disc protrusion, foraminal stenosis, or canal
stenosis.
IMPRESSION: Transitional lumbosacral anatomy. For the purposes of this dictation
and to stay consistent with the prior radiograph dictation, there is
lumbarization of S1 and pseudoarticulation of the left transverse
process of S1 with the sacrum. The the inferior-most fully formed
intervertebral disc is labeled S1-S2.

1. At L5-S1 there is a broad-based disc bulge with mild canal
stenosis and moderate bilateral subarticular recess stenosis with
disc contacting bilateral descending L5 nerve roots.

## 2020-03-05 ENCOUNTER — Other Ambulatory Visit: Payer: Self-pay | Admitting: Family Medicine

## 2020-03-07 DIAGNOSIS — M5126 Other intervertebral disc displacement, lumbar region: Secondary | ICD-10-CM | POA: Diagnosis not present

## 2020-03-12 ENCOUNTER — Encounter: Payer: Self-pay | Admitting: Family Medicine

## 2020-03-15 DIAGNOSIS — M5416 Radiculopathy, lumbar region: Secondary | ICD-10-CM | POA: Diagnosis not present

## 2020-03-19 ENCOUNTER — Ambulatory Visit: Payer: 59 | Admitting: Family Medicine

## 2020-03-21 ENCOUNTER — Other Ambulatory Visit: Payer: Self-pay

## 2020-03-21 ENCOUNTER — Ambulatory Visit: Payer: 59 | Admitting: Family Medicine

## 2020-03-21 ENCOUNTER — Other Ambulatory Visit: Payer: Self-pay | Admitting: Family Medicine

## 2020-03-21 ENCOUNTER — Encounter: Payer: Self-pay | Admitting: Family Medicine

## 2020-03-21 VITALS — BP 148/94 | Temp 98.8°F | Ht 74.0 in | Wt 250.0 lb

## 2020-03-21 DIAGNOSIS — I10 Essential (primary) hypertension: Secondary | ICD-10-CM

## 2020-03-21 DIAGNOSIS — Z13220 Encounter for screening for lipoid disorders: Secondary | ICD-10-CM | POA: Diagnosis not present

## 2020-03-21 DIAGNOSIS — K219 Gastro-esophageal reflux disease without esophagitis: Secondary | ICD-10-CM

## 2020-03-21 DIAGNOSIS — L501 Idiopathic urticaria: Secondary | ICD-10-CM

## 2020-03-21 DIAGNOSIS — E559 Vitamin D deficiency, unspecified: Secondary | ICD-10-CM

## 2020-03-21 MED ORDER — AMLODIPINE BESYLATE 5 MG PO TABS
5.0000 mg | ORAL_TABLET | Freq: Every day | ORAL | 1 refills | Status: DC
Start: 2020-03-21 — End: 2020-03-21

## 2020-03-21 MED ORDER — PANTOPRAZOLE SODIUM 40 MG PO TBEC: 40.0000 mg | DELAYED_RELEASE_TABLET | Freq: Every day | ORAL | 6 refills | Status: DC

## 2020-03-21 NOTE — Assessment & Plan Note (Signed)
Not doing well. Will start him on pantoprozole and stop 1 month if better. Call with any concerns.

## 2020-03-21 NOTE — Progress Notes (Signed)
BP (!) 148/94   Temp 98.8 F (37.1 C) (Oral)   Ht 6\' 2"  (1.88 m)   Wt 250 lb (113.4 kg)   SpO2 100%   BMI 32.10 kg/m    Subjective:    Patient ID: John Huang, male    DOB: 15-Nov-1975, 44 y.o.   MRN: [id number]  HPI: John Huang is a 44 y.o. male  No chief complaint on file.  Has been getting his shots for his allergies- OMALIZUMAB. He was feeling pretty well and after a while, he was having no itching. He notes that they were very expensive. He was feeling like the shots were treating his symptoms, but it was not treating everything. He notes that he was able to hear his heart beat in his ear and he has had some swelling. He notes that when he moved to [place] he lived in an old house. He notes that he had itching with the change in the weather and was having some GI issues and sleep and joint pain. He had the house checked for mold and he notes that it did have mold and he had it remediated. He notes that he has not been feeling better.   He is concerned that he may have mold spores in his body. He notes that he is not feeling well and is concerned about his health. He notes that he stopped taking his allergy shots about 3 months ago. He notes that after stopping it for about 3 months, he started with itching again. He has been having significantly more symptoms of GI upset, joint pain and itching. He notes that he is not losing weight.    HYPERTENSION Hypertension status: stable  Satisfied with current treatment? yes Duration of hypertension: chronic BP monitoring frequency:  not checking BP medication side effects:  no Medication compliance: good compliance Previous BP meds:amlodpine Aspirin: no Recurrent headaches: no Visual changes: no Palpitations: no Dyspnea: no Chest pain: no Lower extremity edema: no Dizzy/lightheaded: no  GERD GERD control status: exacerbated  Satisfied with current treatment? no Heartburn frequency: daily Medication side effects: N/A   Previous GERD medications: gaviscon, tums, pepto Dysphagia: no Odynophagia:  no Hematemesis: no Blood in stool: no EGD: no    Relevant past medical, surgical, family and social history reviewed and updated as indicated. Interim medical history since our last visit reviewed. Allergies and medications reviewed and updated.  Review of Systems  Constitutional: Positive for fatigue. Negative for activity change, appetite change, chills, diaphoresis, fever and unexpected weight change.  HENT: Negative.   Respiratory: Negative.   Cardiovascular: Negative.   Gastrointestinal: Positive for diarrhea. Negative for abdominal distention, abdominal pain, anal bleeding, blood in stool, constipation, nausea, rectal pain and vomiting.  Endocrine: Negative.   Genitourinary: Negative.   Musculoskeletal: Positive for arthralgias, back pain and myalgias. Negative for gait problem, joint swelling, neck pain and neck stiffness.  Skin: Negative.   Neurological: Negative.   Psychiatric/Behavioral: Negative.     Per HPI unless specifically indicated above     Objective:    BP (!) 148/94   Temp 98.8 F (37.1 C) (Oral)   Ht 6\' 2"  (1.88 m)   Wt 250 lb (113.4 kg)   SpO2 100%   BMI 32.10 kg/m   Wt Readings from Last 3 Encounters:  03/21/20 250 lb (113.4 kg)  11/24/19 250 lb (113.4 kg)  06/19/19 244 lb 9.6 oz (110.9 kg)    Physical Exam Vitals and nursing note reviewed.  Constitutional:  General: He is not in acute distress.    Appearance: Normal appearance. He is not ill-appearing, toxic-appearing or diaphoretic.  HENT:     Head: Normocephalic and atraumatic.     Right Ear: External ear normal.     Left Ear: External ear normal.     Nose: Nose normal.     Mouth/Throat:     Mouth: Mucous membranes are moist.     Pharynx: Oropharynx is clear.  Eyes:     General: No scleral icterus.       Right eye: No discharge.        Left eye: No discharge.     Extraocular Movements: Extraocular  movements intact.     Conjunctiva/sclera: Conjunctivae normal.     Pupils: Pupils are equal, round, and reactive to light.  Cardiovascular:     Rate and Rhythm: Normal rate and regular rhythm.     Pulses: Normal pulses.     Heart sounds: Normal heart sounds. No murmur heard.  No friction rub. No gallop.   Pulmonary:     Effort: Pulmonary effort is normal. No respiratory distress.     Breath sounds: Normal breath sounds. No stridor. No wheezing, rhonchi or rales.  Chest:     Chest wall: No tenderness.  Musculoskeletal:        General: Normal range of motion.     Cervical back: Normal range of motion and neck supple.  Skin:    General: Skin is warm and dry.     Capillary Refill: Capillary refill takes less than 2 seconds.     Coloration: Skin is not jaundiced or pale.     Findings: No bruising, erythema, lesion or rash.  Neurological:     General: No focal deficit present.     Mental Status: He is alert and oriented to person, place, and time. Mental status is at baseline.  Psychiatric:        Mood and Affect: Mood normal.        Behavior: Behavior normal.        Thought Content: Thought content normal.        Judgment: Judgment normal.     Results for orders placed or performed in visit on 01/18/20  Microscopic Examination   Urine  Result Value Ref Range   WBC, UA None seen 0 - 5 /hpf   RBC 0-2 0 - 2 /hpf   Epithelial Cells (non renal) 0-10 0 - 10 /hpf   Bacteria, UA None seen None seen/Few  Urinalysis, Routine w reflex microscopic  Result Value Ref Range   Specific Gravity, UA 1.020 1.005 - 1.030   pH, UA 6.5 5.0 - 7.5   Color, UA Yellow Yellow   Appearance Ur Clear Clear   Leukocytes,UA Negative Negative   Protein,UA Negative Negative/Trace   Glucose, UA Negative Negative   Ketones, UA Negative Negative   RBC, UA Trace (A) Negative   Bilirubin, UA Negative Negative   Urobilinogen, Ur 0.2 0.2 - 1.0 mg/dL   Nitrite, UA Negative Negative   Microscopic Examination  See below:       Assessment & Plan:   Problem List Items Addressed This Visit      Cardiovascular and Mediastinum   Hypertension    Under good control on current regimen. Continue current regimen. Continue to monitor. Call with any concerns. Refills given. Labs drawn today.        Relevant Medications   amLODipine (NORVASC) 5 MG tablet  Digestive   GERD (gastroesophageal reflux disease)    Not doing well. Will start him on pantoprozole and stop 1 month if better. Call with any concerns.       Relevant Medications   pantoprazole (PROTONIX) 40 MG tablet   Other Relevant Orders   CBC with Differential/Platelet   Comprehensive metabolic panel     Musculoskeletal and Integument   Idiopathic urticaria - Primary    Was significantly better with monoclonal antibodies, but too expensive. Concerned about mold toxicity. We discussed that I don't really have any training in this, but recommended that he see functional medicine to see if they can help. He is agreeable with this plan. Several locations given to patient today- he will let us know where he'd like to go and we'll send referral and notes. Call with any concerns.        Other Visit Diagnoses    Essential hypertension       Relevant Medications   amLODipine (NORVASC) 5 MG tablet   Other Relevant Orders   Comprehensive metabolic panel   TSH   Screening for cholesterol level       Labs drawn today. Await results. Treat as needed.    Relevant Orders   Comprehensive metabolic panel   Lipid Panel w/o Chol/HDL Ratio   Vitamin D deficiency       Labs drawn today. Await results. Treat as needed.    Relevant Orders   VITAMIN D 25 Hydroxy (Vit-D Deficiency, Fractures)       Follow up plan: Return in about 6 months (around 09/18/2020) for physical.

## 2020-03-21 NOTE — Assessment & Plan Note (Signed)
Under good control on current regimen. Continue current regimen. Continue to monitor. Call with any concerns. Refills given. Labs drawn today.   

## 2020-03-21 NOTE — Addendum Note (Signed)
Addended by: Valerie Roys on: 03/21/2020 03:41 PM   Modules accepted: Orders

## 2020-03-21 NOTE — Patient Instructions (Signed)
[facility]   [phone]  [place]  [phone]  [facility], [facility] [address], [place], [place] [address] Phone: [phone]

## 2020-03-21 NOTE — Telephone Encounter (Signed)
Please Advise.  KP

## 2020-03-21 NOTE — Assessment & Plan Note (Signed)
Was significantly better with monoclonal antibodies, but too expensive. Concerned about mold toxicity. We discussed that I don't really have any training in this, but recommended that he see functional medicine to see if they can help. He is agreeable with this plan. Several locations given to patient today- he will let us know where he'd like to go and we'll send referral and notes. Call with any concerns.

## 2020-03-22 LAB — LIPID PANEL W/O CHOL/HDL RATIO
Cholesterol, Total: 187 mg/dL (ref 100–199)
HDL: 49 mg/dL (ref >39)
LDL Chol Calc (NIH): 121 mg/dL — ABNORMAL HIGH (ref 0–99)
Triglycerides: 93 mg/dL (ref 0–149)
VLDL Cholesterol Cal: 17 mg/dL (ref 5–40)

## 2020-03-22 LAB — VITAMIN D 25 HYDROXY (VIT D DEFICIENCY, FRACTURES): Vit D, 25-Hydroxy: 32.2 ng/mL (ref 30.0–100.0)

## 2020-03-22 LAB — COMPREHENSIVE METABOLIC PANEL
ALT: 23 IU/L (ref 0–44)
AST: 17 IU/L (ref 0–40)
Albumin/Globulin Ratio: 1.5 (ref 1.2–2.2)
Albumin: 4.4 g/dL (ref 4.0–5.0)
Alkaline Phosphatase: 65 IU/L (ref 44–121)
BUN/Creatinine Ratio: 15 (ref 9–20)
BUN: 17 mg/dL (ref 6–24)
Bilirubin Total: 0.3 mg/dL (ref 0.0–1.2)
CO2: 27 mmol/L (ref 20–29)
Calcium: 9.3 mg/dL (ref 8.7–10.2)
Chloride: 103 mmol/L (ref 96–106)
Creatinine, Ser: 1.13 mg/dL (ref 0.76–1.27)
GFR calc Af Amer: 91 mL/min/{1.73_m2} (ref >59)
GFR calc non Af Amer: 79 mL/min/{1.73_m2} (ref >59)
Globulin, Total: 2.9 g/dL (ref 1.5–4.5)
Glucose: 80 mg/dL (ref 65–99)
Potassium: 4.2 mmol/L (ref 3.5–5.2)
Sodium: 141 mmol/L (ref 134–144)
Total Protein: 7.3 g/dL (ref 6.0–8.5)

## 2020-03-22 LAB — CBC WITH DIFFERENTIAL/PLATELET
Basophils Absolute: 0 10*3/uL (ref 0.0–0.2)
Basos: 0 %
EOS (ABSOLUTE): 0 10*3/uL (ref 0.0–0.4)
Eos: 1 %
Hematocrit: 43.5 % (ref 37.5–51.0)
Hemoglobin: 14.5 g/dL (ref 13.0–17.7)
Immature Grans (Abs): 0.1 10*3/uL (ref 0.0–0.1)
Immature Granulocytes: 1 %
Lymphocytes Absolute: 1.9 10*3/uL (ref 0.7–3.1)
Lymphs: 24 %
MCH: 29 pg (ref 26.6–33.0)
MCHC: 33.3 g/dL (ref 31.5–35.7)
MCV: 87 fL (ref 79–97)
Monocytes Absolute: 0.8 10*3/uL (ref 0.1–0.9)
Monocytes: 9 %
Neutrophils Absolute: 5.3 10*3/uL (ref 1.4–7.0)
Neutrophils: 65 %
Platelets: 274 10*3/uL (ref 150–450)
RBC: 5 x10E6/uL (ref 4.14–5.80)
RDW: 15.9 % — ABNORMAL HIGH (ref 11.6–15.4)
WBC: 8.1 10*3/uL (ref 3.4–10.8)

## 2020-03-22 LAB — TSH: TSH: 2.86 u[IU]/mL (ref 0.450–4.500)

## 2020-04-05 ENCOUNTER — Other Ambulatory Visit: Payer: Self-pay | Admitting: Internal Medicine

## 2020-04-05 ENCOUNTER — Ambulatory Visit: Payer: 59 | Attending: Internal Medicine

## 2020-04-05 DIAGNOSIS — Z23 Encounter for immunization: Secondary | ICD-10-CM

## 2020-04-05 NOTE — Progress Notes (Signed)
   Covid-19 Vaccination Clinic  Name:  John Huang    MRN: [id number] DOB: August 09, 1975  04/05/2020  Mr. [name] was observed post Covid-19 immunization for 15 minutes without incident. He was provided with Vaccine Information Sheet and instruction to access the V-Safe system.   Mr. [name] was instructed to call 911 with any severe reactions post vaccine: Marland Kitchen Difficulty breathing  . Swelling of face and throat  . A fast heartbeat  . A bad rash all over body  . Dizziness and weakness   Immunizations Administered    No immunizations on file.

## 2020-04-22 ENCOUNTER — Other Ambulatory Visit: Payer: Self-pay | Admitting: Physician Assistant

## 2020-04-22 DIAGNOSIS — M7712 Lateral epicondylitis, left elbow: Secondary | ICD-10-CM | POA: Diagnosis not present

## 2020-04-30 ENCOUNTER — Ambulatory Visit: Payer: 59

## 2020-05-03 DIAGNOSIS — M7712 Lateral epicondylitis, left elbow: Secondary | ICD-10-CM | POA: Diagnosis not present

## 2020-05-07 DIAGNOSIS — M7712 Lateral epicondylitis, left elbow: Secondary | ICD-10-CM | POA: Diagnosis not present

## 2020-05-09 DIAGNOSIS — M2550 Pain in unspecified joint: Secondary | ICD-10-CM | POA: Diagnosis not present

## 2020-05-09 DIAGNOSIS — R61 Generalized hyperhidrosis: Secondary | ICD-10-CM | POA: Diagnosis not present

## 2020-05-09 DIAGNOSIS — L501 Idiopathic urticaria: Secondary | ICD-10-CM | POA: Diagnosis not present

## 2020-05-15 ENCOUNTER — Encounter: Payer: Self-pay | Admitting: Family Medicine

## 2020-05-22 DIAGNOSIS — Z20828 Contact with and (suspected) exposure to other viral communicable diseases: Secondary | ICD-10-CM | POA: Diagnosis not present

## 2020-05-28 DIAGNOSIS — Z20828 Contact with and (suspected) exposure to other viral communicable diseases: Secondary | ICD-10-CM | POA: Diagnosis not present

## 2020-05-28 DIAGNOSIS — Z20822 Contact with and (suspected) exposure to covid-19: Secondary | ICD-10-CM | POA: Diagnosis not present

## 2020-05-30 ENCOUNTER — Other Ambulatory Visit: Payer: 59

## 2020-05-30 ENCOUNTER — Other Ambulatory Visit: Payer: Self-pay | Admitting: Family Medicine

## 2020-05-30 ENCOUNTER — Other Ambulatory Visit: Payer: Self-pay

## 2020-05-30 DIAGNOSIS — L501 Idiopathic urticaria: Secondary | ICD-10-CM

## 2020-05-31 LAB — COMPREHENSIVE METABOLIC PANEL
ALT: 29 IU/L (ref 0–44)
AST: 22 IU/L (ref 0–40)
Albumin/Globulin Ratio: 1.4 (ref 1.2–2.2)
Albumin: 4.5 g/dL (ref 4.0–5.0)
Alkaline Phosphatase: 66 IU/L (ref 44–121)
BUN/Creatinine Ratio: 8 — ABNORMAL LOW (ref 9–20)
BUN: 9 mg/dL (ref 6–24)
Bilirubin Total: 0.4 mg/dL (ref 0.0–1.2)
CO2: 24 mmol/L (ref 20–29)
Calcium: 9.6 mg/dL (ref 8.7–10.2)
Chloride: 101 mmol/L (ref 96–106)
Creatinine, Ser: 1.11 mg/dL (ref 0.76–1.27)
GFR calc Af Amer: 92 mL/min/{1.73_m2} (ref >59)
GFR calc non Af Amer: 80 mL/min/{1.73_m2} (ref >59)
Globulin, Total: 3.2 g/dL (ref 1.5–4.5)
Glucose: 96 mg/dL (ref 65–99)
Potassium: 4 mmol/L (ref 3.5–5.2)
Sodium: 140 mmol/L (ref 134–144)
Total Protein: 7.7 g/dL (ref 6.0–8.5)

## 2020-05-31 LAB — CBC WITH DIFFERENTIAL/PLATELET
Basophils Absolute: 0 10*3/uL (ref 0.0–0.2)
Basos: 1 %
EOS (ABSOLUTE): 0.1 10*3/uL (ref 0.0–0.4)
Eos: 2 %
Hematocrit: 46.7 % (ref 37.5–51.0)
Hemoglobin: 15.5 g/dL (ref 13.0–17.7)
Immature Grans (Abs): 0 10*3/uL (ref 0.0–0.1)
Immature Granulocytes: 0 %
Lymphocytes Absolute: 1.8 10*3/uL (ref 0.7–3.1)
Lymphs: 42 %
MCH: 29.1 pg (ref 26.6–33.0)
MCHC: 33.2 g/dL (ref 31.5–35.7)
MCV: 88 fL (ref 79–97)
Monocytes Absolute: 0.3 10*3/uL (ref 0.1–0.9)
Monocytes: 8 %
Neutrophils Absolute: 2.1 10*3/uL (ref 1.4–7.0)
Neutrophils: 47 %
Platelets: 267 10*3/uL (ref 150–450)
RBC: 5.33 x10E6/uL (ref 4.14–5.80)
RDW: 14.9 % (ref 11.6–15.4)
WBC: 4.4 10*3/uL (ref 3.4–10.8)

## 2020-05-31 LAB — C-REACTIVE PROTEIN: CRP: 3 mg/L (ref 0–10)

## 2020-05-31 LAB — SEDIMENTATION RATE: Sed Rate: 21 mm/hr — ABNORMAL HIGH (ref 0–15)

## 2020-06-20 DIAGNOSIS — M25522 Pain in left elbow: Secondary | ICD-10-CM | POA: Diagnosis not present

## 2020-06-20 DIAGNOSIS — J45909 Unspecified asthma, uncomplicated: Secondary | ICD-10-CM | POA: Diagnosis not present

## 2020-06-20 DIAGNOSIS — G8918 Other acute postprocedural pain: Secondary | ICD-10-CM | POA: Diagnosis not present

## 2020-06-20 DIAGNOSIS — G4733 Obstructive sleep apnea (adult) (pediatric): Secondary | ICD-10-CM | POA: Diagnosis not present

## 2020-06-20 DIAGNOSIS — K219 Gastro-esophageal reflux disease without esophagitis: Secondary | ICD-10-CM | POA: Diagnosis not present

## 2020-06-20 DIAGNOSIS — M7711 Lateral epicondylitis, right elbow: Secondary | ICD-10-CM | POA: Diagnosis not present

## 2020-06-20 DIAGNOSIS — F4310 Post-traumatic stress disorder, unspecified: Secondary | ICD-10-CM | POA: Diagnosis not present

## 2020-06-20 DIAGNOSIS — D4709 Other mast cell neoplasms of uncertain behavior: Secondary | ICD-10-CM | POA: Diagnosis not present

## 2020-06-20 DIAGNOSIS — M7712 Lateral epicondylitis, left elbow: Secondary | ICD-10-CM | POA: Diagnosis not present

## 2020-06-20 HISTORY — PX: ELBOW SURGERY: SHX618

## 2020-07-30 DIAGNOSIS — M2550 Pain in unspecified joint: Secondary | ICD-10-CM | POA: Diagnosis not present

## 2020-07-30 DIAGNOSIS — L501 Idiopathic urticaria: Secondary | ICD-10-CM | POA: Diagnosis not present

## 2020-07-30 DIAGNOSIS — R61 Generalized hyperhidrosis: Secondary | ICD-10-CM | POA: Diagnosis not present

## 2020-07-30 DIAGNOSIS — K604 Rectal fistula: Secondary | ICD-10-CM | POA: Diagnosis not present

## 2020-08-01 ENCOUNTER — Ambulatory Visit (INDEPENDENT_AMBULATORY_CARE_PROVIDER_SITE_OTHER): Payer: 59 | Admitting: Gastroenterology

## 2020-08-01 ENCOUNTER — Other Ambulatory Visit: Payer: Self-pay

## 2020-08-01 ENCOUNTER — Encounter: Payer: Self-pay | Admitting: Gastroenterology

## 2020-08-01 VITALS — BP 125/77 | HR 76 | Ht 74.0 in | Wt 249.4 lb

## 2020-08-01 DIAGNOSIS — R61 Generalized hyperhidrosis: Secondary | ICD-10-CM | POA: Diagnosis not present

## 2020-08-01 DIAGNOSIS — K921 Melena: Secondary | ICD-10-CM | POA: Diagnosis not present

## 2020-08-01 MED ORDER — CLENPIQ 10-3.5-12 MG-GM -GM/160ML PO SOLN: 320.0000 mL | ORAL | 0 refills | Status: DC

## 2020-08-01 NOTE — Progress Notes (Signed)
Primary Care Physician: Valerie Roys, DO  Primary Gastroenterologist:  Dr. Lucilla Lame  Chief Complaint  Patient presents with  . Rectal Bleeding    HPI: John Huang is a 45 y.o. male here for follow-up of rectal bleeding.  The patient has had Bright red blood per rectum.  The patient was seen by his private care physician and had reported that he was having night sweats.  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  The patient denies any diarrhea or constipation.  The patient was doing well from a anal fissure point of view and that is what he had seen me for originally.  There is no rectal pain at the present time.  The patient comes in with pictures on his phone of the bright red blood on the toilet paper and in the toilet water.  The patient did have a colonoscopy with adenomatous polyps at an outside hospital in 2017.  Past Medical History:  Diagnosis Date  . Bulging lumbar disc   . CAP (community acquired pneumonia) 02/18/2016  . Cholinergic urticaria   . Epistaxis 05/24/2017  . 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 History of septic arthritis 2000   L knee  . Hypertension     Current Outpatient Medications  Medication Sig Dispense Refill  . amLODipine (NORVASC) 5 MG tablet Take 1 tablet (5 mg total) by mouth daily. 90 tablet 1  . citalopram (CELEXA) 40 MG tablet TAKE 1 TABLET BY MOUTH DAILY. 90 tablet 1  . fexofenadine (ALLEGRA) 180 MG tablet Take 1-2 tablets (180-360 mg total) by mouth daily. (Patient taking differently: Take 180-360 mg by mouth daily. Takes 2-3 daily) 180 tablet 3  . Multiple Vitamin (MULTI-VITAMINS) TABS Take by mouth.    . naproxen (NAPROSYN) 500 MG tablet Take 1 tablet (500 mg total) by mouth 2 (two) times daily with a meal. 60 tablet 6  . pantoprazole (PROTONIX) 40 MG tablet Take 1 tablet (40 mg total) by mouth daily. 30 tablet 6   . omalizumab (XOLAIR) 150 MG injection Inject into the skin every 28 (twenty-eight) days. (Patient not taking: No sig reported)    . Sod Picosulfate-Mag Ox-Cit Acd (CLENPIQ) 10-3.5-12 MG-GM -GM/160ML SOLN Take 320 mLs by mouth as directed. 320 mL 0   No current facility-administered medications for this visit.    Allergies as of 08/01/2020 - Review Complete 08/01/2020  Allergen Reaction Noted  . Cat hair extract Hives and Other (See Comments) 10/15/2015  . Other Hives and Other (See Comments) 10/15/2015  . Galactose Other (See Comments) 05/20/2017    ROS:  General: Negative for anorexia, weight loss, fever, chills, fatigue, weakness. ENT: Negative for hoarseness, difficulty swallowing , nasal congestion. CV: Negative for chest pain, angina, palpitations, dyspnea on exertion, peripheral edema.  Respiratory: Negative for dyspnea at rest, dyspnea on exertion, cough, sputum, wheezing.  GI: See history of present illness. GU:  Negative for dysuria, hematuria, urinary incontinence, urinary frequency, nocturnal urination.  Endo: Negative for unusual weight change.    Physical Examination:   BP 125/77   Pulse 76   Ht 6\' 2"  (1.88 m)   Wt 249 lb 6.4 oz (113.1 kg)   BMI 32.02 kg/m   General: Well-nourished, well-developed in no acute distress.  Eyes: No icterus. Conjunctivae pink. Lungs: Clear to auscultation bilaterally.  Non-labored. Heart: Regular rate and rhythm, no murmurs rubs or gallops.  Abdomen: Bowel sounds are normal, nontender, nondistended, no hepatosplenomegaly or masses, no abdominal bruits or hernia , no rebound or guarding.   Extremities: No lower extremity edema. No clubbing or deformities. Neuro: Alert and oriented x 3.  Grossly intact. Skin: Warm and dry, no jaundice.   Psych: Alert and cooperative, normal mood and affect.  Labs:    Imaging Studies: No results found.  Assessment and Plan:   LENNIN OSMOND is a 45 y.o. 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 The patient will be set up for an EGD and colonoscopy.  The patient is concerned that his night sweats and possible infection may be coming from a GI source since he is having rectal bleeding.  The patient has been explained the plan and agrees with it.    Lucilla Lame, MD. Marval Regal    Note: This dictation was prepared with Dragon dictation along with smaller phrase technology. Any transcriptional errors that result from this process are unintentional.

## 2020-08-02 ENCOUNTER — Other Ambulatory Visit: Payer: Self-pay

## 2020-08-02 DIAGNOSIS — K921 Melena: Secondary | ICD-10-CM

## 2020-08-08 ENCOUNTER — Other Ambulatory Visit: Payer: Self-pay

## 2020-08-08 ENCOUNTER — Encounter: Payer: Self-pay | Admitting: Gastroenterology

## 2020-08-14 ENCOUNTER — Other Ambulatory Visit: Payer: Self-pay

## 2020-08-14 ENCOUNTER — Other Ambulatory Visit
Admission: RE | Admit: 2020-08-14 | Discharge: 2020-08-14 | Disposition: A | Discharge status: Home / Self Care | Payer: 59 | Source: Ambulatory Visit | Attending: Gastroenterology | Admitting: Gastroenterology

## 2020-08-14 DIAGNOSIS — R61 Generalized hyperhidrosis: Secondary | ICD-10-CM | POA: Diagnosis not present

## 2020-08-14 DIAGNOSIS — Z20822 Contact with and (suspected) exposure to covid-19: Secondary | ICD-10-CM | POA: Insufficient documentation

## 2020-08-14 DIAGNOSIS — L501 Idiopathic urticaria: Secondary | ICD-10-CM | POA: Diagnosis not present

## 2020-08-14 DIAGNOSIS — Z01812 Encounter for preprocedural laboratory examination: Secondary | ICD-10-CM | POA: Insufficient documentation

## 2020-08-14 DIAGNOSIS — K604 Rectal fistula: Secondary | ICD-10-CM | POA: Diagnosis not present

## 2020-08-14 DIAGNOSIS — M2550 Pain in unspecified joint: Secondary | ICD-10-CM | POA: Diagnosis not present

## 2020-08-14 LAB — SARS CORONAVIRUS 2 (TAT 6-24 HRS): SARS Coronavirus 2: NEGATIVE

## 2020-08-15 NOTE — Discharge Instructions (Signed)

## 2020-08-16 ENCOUNTER — Encounter
Admission: RE | Disposition: A | Discharge status: Home / Self Care | Payer: Self-pay | Source: Ambulatory Visit | Attending: Gastroenterology

## 2020-08-16 ENCOUNTER — Encounter: Payer: Self-pay | Admitting: Family Medicine

## 2020-08-16 ENCOUNTER — Other Ambulatory Visit: Payer: Self-pay

## 2020-08-16 ENCOUNTER — Ambulatory Visit
Admission: RE | Admit: 2020-08-16 | Discharge: 2020-08-16 | Disposition: A | Discharge status: Home / Self Care | Payer: 59 | Source: Ambulatory Visit | Attending: Gastroenterology | Admitting: Gastroenterology

## 2020-08-16 ENCOUNTER — Ambulatory Visit: Payer: 59 | Admitting: Anesthesiology

## 2020-08-16 ENCOUNTER — Encounter: Payer: Self-pay | Admitting: Gastroenterology

## 2020-08-16 DIAGNOSIS — K5730 Diverticulosis of large intestine without perforation or abscess without bleeding: Secondary | ICD-10-CM | POA: Insufficient documentation

## 2020-08-16 DIAGNOSIS — K921 Melena: Secondary | ICD-10-CM | POA: Insufficient documentation

## 2020-08-16 DIAGNOSIS — K602 Anal fissure, unspecified: Secondary | ICD-10-CM | POA: Diagnosis not present

## 2020-08-16 DIAGNOSIS — Z888 Allergy status to other drugs, medicaments and biological substances status: Secondary | ICD-10-CM | POA: Diagnosis not present

## 2020-08-16 DIAGNOSIS — Z9109 Other allergy status, other than to drugs and biological substances: Secondary | ICD-10-CM | POA: Diagnosis not present

## 2020-08-16 DIAGNOSIS — K604 Rectal fistula: Secondary | ICD-10-CM

## 2020-08-16 DIAGNOSIS — K640 First degree hemorrhoids: Secondary | ICD-10-CM | POA: Insufficient documentation

## 2020-08-16 DIAGNOSIS — K603 Anal fistula: Secondary | ICD-10-CM | POA: Diagnosis not present

## 2020-08-16 DIAGNOSIS — Z79899 Other long term (current) drug therapy: Secondary | ICD-10-CM | POA: Insufficient documentation

## 2020-08-16 DIAGNOSIS — K2970 Gastritis, unspecified, without bleeding: Secondary | ICD-10-CM | POA: Diagnosis not present

## 2020-08-16 DIAGNOSIS — Z808 Family history of malignant neoplasm of other organs or systems: Secondary | ICD-10-CM | POA: Insufficient documentation

## 2020-08-16 DIAGNOSIS — Z800 Family history of malignant neoplasm of digestive organs: Secondary | ICD-10-CM | POA: Diagnosis not present

## 2020-08-16 DIAGNOSIS — K219 Gastro-esophageal reflux disease without esophagitis: Secondary | ICD-10-CM | POA: Diagnosis not present

## 2020-08-16 DIAGNOSIS — Z833 Family history of diabetes mellitus: Secondary | ICD-10-CM | POA: Insufficient documentation

## 2020-08-16 DIAGNOSIS — Z8379 Family history of other diseases of the digestive system: Secondary | ICD-10-CM | POA: Diagnosis not present

## 2020-08-16 DIAGNOSIS — Z8249 Family history of ischemic heart disease and other diseases of the circulatory system: Secondary | ICD-10-CM | POA: Diagnosis not present

## 2020-08-16 DIAGNOSIS — Z791 Long term (current) use of non-steroidal anti-inflammatories (NSAID): Secondary | ICD-10-CM | POA: Diagnosis not present

## 2020-08-16 HISTORY — DX: Other seasonal allergic rhinitis: J30.2

## 2020-08-16 HISTORY — DX: Sleep apnea, unspecified: G47.30

## 2020-08-16 HISTORY — DX: Nausea with vomiting, unspecified: R11.2

## 2020-08-16 HISTORY — PX: COLONOSCOPY WITH PROPOFOL: SHX5780

## 2020-08-16 HISTORY — DX: Other specified postprocedural states: Z98.890

## 2020-08-16 HISTORY — PX: ESOPHAGOGASTRODUODENOSCOPY (EGD) WITH PROPOFOL: SHX5813

## 2020-08-16 HISTORY — DX: Unspecified asthma, uncomplicated: J45.909

## 2020-08-16 HISTORY — DX: Dizziness and giddiness: R42

## 2020-08-16 HISTORY — DX: Other complications of anesthesia, initial encounter: T88.59XA

## 2020-08-16 HISTORY — DX: Gastro-esophageal reflux disease without esophagitis: K21.9

## 2020-08-16 SURGERY — COLONOSCOPY WITH PROPOFOL
Anesthesia: General

## 2020-08-16 MED ORDER — PROMETHAZINE HCL 25 MG/ML IJ SOLN: 6.2500 mg | INTRAMUSCULAR | Status: DC | PRN

## 2020-08-16 MED ORDER — LIDOCAINE HCL (CARDIAC) PF 100 MG/5ML IV SOSY
PREFILLED_SYRINGE | INTRAVENOUS | Status: DC | PRN
  Administered 2020-08-16: 50 mg via INTRAVENOUS

## 2020-08-16 MED ORDER — LACTATED RINGERS IV SOLN: INTRAVENOUS | Status: DC

## 2020-08-16 MED ORDER — PANTOPRAZOLE SODIUM 40 MG PO TBEC: 40.0000 mg | DELAYED_RELEASE_TABLET | Freq: Every day | ORAL | 11 refills | Status: DC

## 2020-08-16 MED ORDER — OXYCODONE HCL 5 MG/5ML PO SOLN: 5.0000 mg | Freq: Once | ORAL | Status: DC | PRN

## 2020-08-16 MED ORDER — OXYCODONE HCL 5 MG PO TABS: 5.0000 mg | ORAL_TABLET | Freq: Once | ORAL | Status: DC | PRN

## 2020-08-16 MED ORDER — PROPOFOL 10 MG/ML IV BOLUS
INTRAVENOUS | Status: DC | PRN
  Administered 2020-08-16: 20 mg via INTRAVENOUS
  Administered 2020-08-16: 80 mg via INTRAVENOUS
  Administered 2020-08-16: 40 mg via INTRAVENOUS
  Administered 2020-08-16 (×2): 20 mg via INTRAVENOUS
  Administered 2020-08-16: 30 mg via INTRAVENOUS
  Administered 2020-08-16: 20 mg via INTRAVENOUS

## 2020-08-16 MED ORDER — MEPERIDINE HCL 25 MG/ML IJ SOLN: 6.2500 mg | INTRAMUSCULAR | Status: DC | PRN

## 2020-08-16 MED ORDER — SODIUM CHLORIDE 0.9 % IV SOLN: INTRAVENOUS | Status: DC

## 2020-08-16 MED ORDER — FENTANYL CITRATE (PF) 100 MCG/2ML IJ SOLN: 25.0000 ug | INTRAMUSCULAR | Status: DC | PRN

## 2020-08-16 MED ORDER — STERILE WATER FOR IRRIGATION IR SOLN
Status: DC | PRN
  Administered 2020-08-16: 150 mL

## 2020-08-16 SURGICAL SUPPLY — 38 items
BALLN DILATOR 10-12 8 (BALLOONS)
BALLN DILATOR 12-15 8 (BALLOONS)
BALLN DILATOR 15-18 8 (BALLOONS)
BALLN DILATOR CRE 0-12 8 (BALLOONS)
BALLN DILATOR ESOPH 8 10 CRE (MISCELLANEOUS) IMPLANT
BALLOON DILATOR 12-15 8 (BALLOONS) IMPLANT
BALLOON DILATOR 15-18 8 (BALLOONS) IMPLANT
BALLOON DILATOR CRE 0-12 8 (BALLOONS) IMPLANT
BLOCK BITE 60FR ADLT L/F GRN (MISCELLANEOUS) ×2 IMPLANT
CLIP HMST 235XBRD CATH ROT (MISCELLANEOUS) IMPLANT
CLIP RESOLUTION 360 11X235 (MISCELLANEOUS)
ELECT REM PT RETURN 9FT ADLT (ELECTROSURGICAL)
ELECTRODE REM PT RTRN 9FT ADLT (ELECTROSURGICAL) IMPLANT
FCP ESCP3.2XJMB 240X2.8X (MISCELLANEOUS)
FORCEPS BIOP RAD 4 LRG CAP 4 (CUTTING FORCEPS) ×2 IMPLANT
FORCEPS BIOP RJ4 240 W/NDL (MISCELLANEOUS)
FORCEPS ESCP3.2XJMB 240X2.8X (MISCELLANEOUS) IMPLANT
GOWN CVR UNV OPN BCK APRN NK (MISCELLANEOUS) ×2 IMPLANT
GOWN ISOL THUMB LOOP REG UNIV (MISCELLANEOUS) ×4
INJECTOR VARIJECT VIN23 (MISCELLANEOUS) IMPLANT
KIT DEFENDO VALVE AND CONN (KITS) IMPLANT
KIT PRC NS LF DISP ENDO (KITS) ×1 IMPLANT
KIT PROCEDURE OLYMPUS (KITS) ×2
MANIFOLD NEPTUNE II (INSTRUMENTS) ×2 IMPLANT
MARKER SPOT ENDO TATTOO 5ML (MISCELLANEOUS) IMPLANT
PROBE APC STR FIRE (PROBE) IMPLANT
RETRIEVER NET PLAT FOOD (MISCELLANEOUS) IMPLANT
RETRIEVER NET ROTH 2.5X230 LF (MISCELLANEOUS) IMPLANT
SNARE COLD EXACTO (MISCELLANEOUS) IMPLANT
SNARE SHORT THROW 13M SML OVAL (MISCELLANEOUS) IMPLANT
SNARE SHORT THROW 30M LRG OVAL (MISCELLANEOUS) IMPLANT
SNARE SNG USE RND 15MM (INSTRUMENTS) IMPLANT
SPOT EX ENDOSCOPIC TATTOO (MISCELLANEOUS)
SYR INFLATION 60ML (SYRINGE) IMPLANT
TRAP ETRAP POLY (MISCELLANEOUS) IMPLANT
VARIJECT INJECTOR VIN23 (MISCELLANEOUS)
WATER STERILE IRR 250ML POUR (IV SOLUTION) ×2 IMPLANT
WIRE CRE 18-20MM 8CM F G (MISCELLANEOUS) IMPLANT

## 2020-08-16 NOTE — Op Note (Signed)
[facility] Gastroenterology Patient Name: John Huang Procedure Date: 08/16/2020 7:36 AM MRN: [id number] Account #: [id number] Date of Birth: August 12, 1975 Admit Type: Outpatient Age: 45 Room: [facility] OR ROOM 01 Gender: Male Note Status: Finalized Procedure:             Upper GI endoscopy Indications:           Heartburn Providers:             Lucilla Lame MD, MD Referring MD:          Valerie Roys (Referring MD) Medicines:             Propofol per Anesthesia Complications:         No immediate complications. Procedure:             Pre-Anesthesia Assessment:                        - Prior to the procedure, a History and Physical was                         performed, and patient medications and allergies were                         reviewed. The patient's tolerance of previous                         anesthesia was also reviewed. The risks and benefits                         of the procedure and the sedation options and risks                         were discussed with the patient. All questions were                         answered, and informed consent was obtained. Prior                         Anticoagulants: The patient has taken no previous                         anticoagulant or antiplatelet agents. ASA Grade                         Assessment: II - A patient with mild systemic disease.                         After reviewing the risks and benefits, the patient                         was deemed in satisfactory condition to undergo the                         procedure.                        After obtaining informed consent, the endoscope was  passed under direct vision. Throughout the procedure,                         the patient's blood pressure, pulse, and oxygen                         saturations were monitored continuously. The was                         introduced through the mouth, and advanced to the                          second part of duodenum. The upper GI endoscopy was                         accomplished without difficulty. The patient tolerated                         the procedure well. Findings:      The examined esophagus was normal.      Localized mild inflammation characterized by erythema was found in the       gastric antrum. Biopsies were taken with a cold forceps for histology.      The examined duodenum was normal. Impression:            - Normal esophagus.                        - Gastritis. Biopsied.                        - Normal examined duodenum. Recommendation:        - Discharge patient to home.                        - Resume previous diet.                        - Continue present medications.                        - Await pathology results.                        - Perform a colonoscopy today. Procedure Code(s):     --- Professional ---                        [phone], Esophagogastroduodenoscopy, flexible,                         transoral; with biopsy, single or multiple Diagnosis Code(s):     --- Professional ---                        R12, Heartburn                        K29.70, Gastritis, unspecified, without bleeding CPT copyright 2019 American Medical Association. All rights reserved. The codes documented in this report are preliminary and upon coder review may  be revised to meet current compliance requirements. Lucilla Lame MD, MD 08/16/2020 8:08:19 AM This report  has been signed electronically. Number of Addenda: 0 Note Initiated On: 08/16/2020 7:36 AM Total Procedure Duration: 0 hours 2 minutes 52 seconds  Estimated Blood Loss:  Estimated blood loss: none.      [facility]

## 2020-08-16 NOTE — Op Note (Signed)
[facility] Gastroenterology Patient Name: John Huang Procedure Date: 08/16/2020 7:35 AM MRN: [id number] Account #: [id number] Date of Birth: 1976-02-28 Admit Type: Outpatient Age: 45 Room: [facility] OR ROOM 01 Gender: Male Note Status: Finalized Procedure:             Colonoscopy Indications:           Hematochezia Providers:             Lucilla Lame MD, MD Referring MD:          Valerie Roys (Referring MD) Medicines:             Propofol per Anesthesia Complications:         No immediate complications. Procedure:             Pre-Anesthesia Assessment:                        - Prior to the procedure, a History and Physical was                         performed, and patient medications and allergies were                         reviewed. The patient's tolerance of previous                         anesthesia was also reviewed. The risks and benefits                         of the procedure and the sedation options and risks                         were discussed with the patient. All questions were                         answered, and informed consent was obtained. Prior                         Anticoagulants: The patient has taken no previous                         anticoagulant or antiplatelet agents. ASA Grade                         Assessment: II - A patient with mild systemic disease.                         After reviewing the risks and benefits, the patient                         was deemed in satisfactory condition to undergo the                         procedure.                        After obtaining informed consent, the colonoscope was                         passed  under direct vision. Throughout the procedure,                         the patient's blood pressure, pulse, and oxygen                         saturations were monitored continuously. The                         Colonoscope was introduced through the anus and                          advanced to the the cecum, identified by appendiceal                         orifice and ileocecal valve. The colonoscopy was                         performed without difficulty. The patient tolerated                         the procedure well. The quality of the bowel                         preparation was excellent. Findings:      The digital rectal exam findings include perianal fistula.      Non-bleeding internal hemorrhoids were found during retroflexion. The       hemorrhoids were Grade I (internal hemorrhoids that do not prolapse).      Multiple small-mouthed diverticula were found in the sigmoid colon.      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                        - Non-bleeding internal hemorrhoids.                        - Diverticulosis in the sigmoid colon.                        - No specimens collected. Recommendation:        - Discharge patient to home.                        - Resume previous diet.                        - Continue present medications.                        - Refer to a Psychologist, sport and exercise. Procedure Code(s):     --- Professional ---                        [phone], Colonoscopy, flexible; diagnostic, including                         collection of specimen(s) by brushing or washing, when  performed (separate procedure) Diagnosis Code(s):     --- Professional ---                        K92.1, Melena (includes Hematochezia) CPT copyright 2019 American Medical Association. All rights reserved. The codes documented in this report are preliminary and upon coder review may  be revised to meet current compliance requirements. Lucilla Lame MD, MD 08/16/2020 8:27:30 AM This report has been signed electronically. Number of Addenda: 0 Note Initiated On: 08/16/2020 7:35 AM Scope Withdrawal Time: 0 hours 8 minutes 9 seconds  Total Procedure Duration: 0 hours 11 minutes 47 seconds  Estimated Blood Loss:   Estimated blood loss: none.      [facility]

## 2020-08-16 NOTE — Anesthesia Preprocedure Evaluation (Signed)
Anesthesia Evaluation  Patient identified by MRN, date of birth, ID band Patient awake    Reviewed: Allergy & Precautions, H&P , NPO status , Patient's Chart, lab work & pertinent test results, reviewed documented beta blocker date and time   History of Anesthesia Complications (+) PONV and history of anesthetic complications  Airway Mallampati: II  TM Distance: >3 FB Neck ROM: full    Dental no notable dental hx.    Pulmonary asthma , sleep apnea and Continuous Positive Airway Pressure Ventilation , pneumonia,    Pulmonary exam normal breath sounds clear to auscultation       Cardiovascular Exercise Tolerance: Good hypertension, negative cardio ROS   Rhythm:regular Rate:Normal     Neuro/Psych Anxiety negative neurological ROS  negative psych ROS   GI/Hepatic negative GI ROS, Neg liver ROS, GERD  ,  Endo/Other  negative endocrine ROS  Renal/GU Renal disease  negative genitourinary   Musculoskeletal   Abdominal   Peds  Hematology negative hematology ROS (+)   Anesthesia Other Findings   Reproductive/Obstetrics negative OB ROS                             Anesthesia Physical Anesthesia Plan  ASA: II  Anesthesia Plan: General   Post-op Pain Management:    Induction:   PONV Risk Score and Plan: 3 and Propofol infusion, TIVA and Treatment may vary due to age or medical condition  Airway Management Planned:   Additional Equipment:   Intra-op Plan:   Post-operative Plan:   Informed Consent: I have reviewed the patients History and Physical, chart, labs and discussed the procedure including the risks, benefits and alternatives for the proposed anesthesia with the patient or authorized representative who has indicated his/her understanding and acceptance.     Dental Advisory Given  Plan Discussed with: CRNA  Anesthesia Plan Comments:         Anesthesia Quick  Evaluation

## 2020-08-16 NOTE — Anesthesia Procedure Notes (Signed)
Procedure Name: MAC Date/Time: 08/16/2020 8:02 AM Performed by: Vanetta Shawl, CRNA Pre-anesthesia Checklist: Patient identified, Emergency Drugs available, Suction available, Timeout performed and Patient being monitored Patient Re-evaluated:Patient Re-evaluated prior to induction Oxygen Delivery Method: Nasal cannula Placement Confirmation: positive ETCO2

## 2020-08-16 NOTE — Transfer of Care (Signed)
Immediate Anesthesia Transfer of Care Note  Patient: John Huang  Procedure(s) Performed: COLONOSCOPY WITH PROPOFOL (N/A ) ESOPHAGOGASTRODUODENOSCOPY (EGD) WITH PROPOFOL (N/A )  Patient Location: PACU  Anesthesia Type: General  Level of Consciousness: awake, alert  and patient cooperative  Airway and Oxygen Therapy: Patient Spontanous Breathing   Post-op Assessment: Post-op Vital signs reviewed, Patient's Cardiovascular Status Stable, Respiratory Function Stable, Patent Airway and No signs of Nausea or vomiting  Post-op Vital Signs: Reviewed and stable  Complications: No complications documented.

## 2020-08-16 NOTE — Anesthesia Postprocedure Evaluation (Signed)
Anesthesia Post Note  Patient: John Huang  Procedure(s) Performed: COLONOSCOPY WITH PROPOFOL (N/A ) ESOPHAGOGASTRODUODENOSCOPY (EGD) WITH PROPOFOL (N/A )     Patient location during evaluation: PACU Anesthesia Type: General Level of consciousness: awake and alert Pain management: pain level controlled Vital Signs Assessment: post-procedure vital signs reviewed and stable Respiratory status: spontaneous breathing, nonlabored ventilation, respiratory function stable and patient connected to nasal cannula oxygen Cardiovascular status: blood pressure returned to baseline and stable Postop Assessment: no apparent nausea or vomiting Anesthetic complications: no   No complications documented.  Barclay Lennox, Glade Stanford

## 2020-08-16 NOTE — H&P (Signed)
John Lame, MD [address] [place], [place] [address] Phone:[phone] Fax : [phone]  Primary Care Physician:  John Roys, DO Primary Gastroenterologist:  Dr. Allen Huang  Pre-Procedure History & Physical: HPI:  John Huang is a 45 y.o. male is here for an endoscopy and colonoscopy.   Past Medical History:  Diagnosis Date  . Asthma    when 45 yrs old, years ago  . Bulging lumbar disc   . CAP (community acquired pneumonia) 02/18/2016  . Cholinergic urticaria   . Complication of anesthesia   . Epistaxis 05/24/2017  . GERD (gastroesophageal reflux disease)   . 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 History of septic arthritis 2000   L knee  . Hypertension    controlled  . PONV (postoperative nausea and vomiting)   . Seasonal allergies   . Sleep apnea    CPAP in past then throat surgery, no CPAP  . Vertigo    Hx of    Past Surgical History:  Procedure Laterality Date  . ANTERIOR CRUCIATE LIGAMENT REPAIR    . ELBOW SURGERY Left 06/20/2020  . FOOT SURGERY  2020  . MEDIAL COLLATERAL LIGAMENT AND LATERAL COLLATERAL LIGAMENT REPAIR, KNEE    . SHOULDER SURGERY     dislocation and tac left in there, so had to have surgery to removed the tac  . TONSILLECTOMY  2020  . WISDOM TOOTH EXTRACTION      Prior to Admission medications   Medication Sig Start Date End Date Taking? Authorizing Provider  amLODipine (NORVASC) 5 MG tablet Take 1 tablet (5 mg total) by mouth daily. 03/21/20  Yes Huang, John P, DO  citalopram (CELEXA) 40 MG tablet TAKE 1 TABLET BY MOUTH DAILY. 03/05/20  Yes Huang, John P, DO  fexofenadine (ALLEGRA) 180 MG tablet Take 1-2 tablets (180-360 mg total) by mouth daily. Patient taking differently: Take 180-360 mg by mouth daily. Takes 2-3 daily 10/21/17  Yes Huang, John P, DO  finasteride (PROPECIA) 1 MG tablet Take 1 mg by mouth daily. am   Yes [provider]  Multiple Vitamin (MULTI-VITAMINS) TABS Take by mouth.    Yes [provider]  naproxen (NAPROSYN) 500 MG tablet Take 1 tablet (500 mg total) by mouth 2 (two) times daily with a meal. Patient taking differently: Take 500 mg by mouth as needed. 04/21/19  Yes Huang, John P, DO  omalizumab (XOLAIR) 150 MG injection Inject into the skin every 28 (twenty-eight) days. Patient not taking: No sig reported    [provider]  pantoprazole (PROTONIX) 40 MG tablet Take 1 tablet (40 mg total) by mouth daily. Patient not taking: No sig reported 03/21/20   John Liter P, DO  Sod Picosulfate-Mag Ox-Cit Acd (CLENPIQ) 10-3.5-12 MG-GM -GM/160ML SOLN Take 320 mLs by mouth as directed. 08/01/20   John Lame, MD    Allergies as of 08/02/2020 - Review Complete 08/01/2020  Allergen Reaction Noted  . Cat hair extract Hives and Other (See Comments) 10/15/2015  . Other Hives and Other (See Comments) 10/15/2015  . Galactose Other (See Comments) 05/20/2017    Family History  Problem Relation Age of Onset  . Stroke Mother   . Hypertension Mother   . Diabetes Mother   . Cancer Father        Throat  . Cancer Sister        Eye/Brain  . Hypertension Brother   . Irritable bowel syndrome Brother  Social History   Socioeconomic History  . Marital status: Married    Spouse name: Not on file  . Number of children: Not on file  . Years of education: Not on file  . Highest education level: Not on file  Occupational History  . Not on file  Tobacco Use  . Smoking status: Never Smoker  . Smokeless tobacco: Never Used  Vaping Use  . Vaping Use: Never used  Substance and Sexual Activity  . Alcohol use: Not Currently  . Drug use: Never  . Sexual activity: Yes    Birth control/protection: None  Other Topics Concern  . Not on file  Social History Narrative  . Not on file   Social Determinants of Health   Financial Resource Strain: Not on file  Food Insecurity: Not on file  Transportation Needs: Not on file  Physical Activity: Not on  file  Stress: Not on file  Social Connections: Not on file  Intimate Partner Violence: Not on file    Review of Systems: See HPI, otherwise negative ROS  Physical Exam: BP 129/84   Pulse 64   Temp (!) 96.2 F (35.7 C)   Ht 6\' 2"  (1.88 m)   Wt 108.9 kg   SpO2 98%   BMI 30.81 kg/m  General:   Alert,  pleasant and cooperative in NAD Head:  Normocephalic and atraumatic. Neck:  Supple; no masses or thyromegaly. Lungs:  Clear throughout to auscultation.    Heart:  Regular rate and rhythm. Abdomen:  Soft, nontender and nondistended. Normal bowel sounds, without guarding, and without rebound.   Neurologic:  Alert and  oriented x4;  grossly normal neurologically.  Impression/Plan: John Huang is here for an endoscopy and colonoscopy to be performed for rectal bleeding and GERD  Risks, benefits, limitations, and alternatives regarding  endoscopy and colonoscopy have been reviewed with the patient.  Questions have been answered.  All parties agreeable.   John Lame, MD  08/16/2020, 7:52 AM

## 2020-08-19 ENCOUNTER — Other Ambulatory Visit: Payer: 59

## 2020-08-19 ENCOUNTER — Encounter: Payer: Self-pay | Admitting: Gastroenterology

## 2020-08-20 ENCOUNTER — Encounter: Payer: Self-pay | Admitting: Gastroenterology

## 2020-08-20 LAB — SURGICAL PATHOLOGY

## 2020-08-28 ENCOUNTER — Encounter: Payer: Self-pay | Admitting: Surgery

## 2020-08-28 ENCOUNTER — Ambulatory Visit (INDEPENDENT_AMBULATORY_CARE_PROVIDER_SITE_OTHER): Payer: 59 | Admitting: Surgery

## 2020-08-28 ENCOUNTER — Other Ambulatory Visit: Payer: Self-pay

## 2020-08-28 VITALS — BP 131/94 | HR 83 | Temp 98.9°F | Ht 74.0 in | Wt 242.0 lb

## 2020-08-28 DIAGNOSIS — K603 Anal fistula: Secondary | ICD-10-CM | POA: Diagnosis not present

## 2020-08-28 NOTE — Progress Notes (Signed)
08/28/2020  Reason for Visit:  Perianal fistula  Referring Provider:  Park Liter, DO.  History of Present Illness: John Huang is a 45 y.o. 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  The patient has a history of prior perianal fistula which was treated at [facility] in 2018, s/p colonoscopy and fistulotomy with Dr. Sharol Roussel.  Initially after surgery, he was doing well, without symptoms.  However, over the past year, he feels that the initial symptoms are returning again.  He reports that he's feeling pain in the perianal area with bowel movements, and describes it as raw burning/scratching with bowel movements.  This is associated with blood on the toilet bowl and tissue paper.  The discomfort usually lasts a few hours after a bowel movement and then dissipates.  Sometimes he feels discomfort when he's sitting down or walking.  Another symptom he's having is pressure sensation after bowel movement, as if he needs to have more stool out.  More recently, he's also noticing stool like drainage that he sees on his underwear.  He's also having night sweats and is very tired recently because he's not sleeping well because of the night sweats.  Denies chest pain, shortness of breath, nausea, vomiting, abdominal pain.    He had an MRI of the pelvis at [facility] on 08/14/20 which showed irregularity of the rectal posterior wall below the level of the levator ani, tracking towards the right buttock.  Unfortunately, I am unable to see any images.  There was suspicion for a fistula, but there was no fluid collection or abscess.  He had a colonoscopy with Dr. Allen Norris on 08/16/20 which showed a perianal fistula on external anal exam.  Past Medical History: Past Medical History:  Diagnosis Date  . Asthma    when 45 yrs old, years ago  . Bulging lumbar disc   . CAP (community acquired pneumonia) 02/18/2016  . Cholinergic urticaria   . Complication of anesthesia   . Epistaxis 05/24/2017  . GERD (gastroesophageal  reflux disease)   . 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 History of septic arthritis 2000   L knee  . Hypertension    controlled  . PONV (postoperative nausea and vomiting)   . Seasonal allergies   . Sleep apnea    CPAP in past then throat surgery, no CPAP  . Vertigo    Hx of     Past Surgical History: Past Surgical History:  Procedure Laterality Date  . ANTERIOR CRUCIATE LIGAMENT REPAIR    . COLONOSCOPY WITH PROPOFOL N/A 08/16/2020   Procedure: COLONOSCOPY WITH PROPOFOL;  Surgeon: Lucilla Lame, MD;  Location: [place];  Service: Endoscopy;  Laterality: N/A;  . ELBOW SURGERY Left 06/20/2020  . ESOPHAGOGASTRODUODENOSCOPY (EGD) WITH PROPOFOL N/A 08/16/2020   Procedure: ESOPHAGOGASTRODUODENOSCOPY (EGD) WITH PROPOFOL;  Surgeon: Lucilla Lame, MD;  Location: [place];  Service: Endoscopy;  Laterality: N/A;  . FOOT SURGERY  2020  . MEDIAL COLLATERAL LIGAMENT AND LATERAL COLLATERAL LIGAMENT REPAIR, KNEE    . SHOULDER SURGERY     dislocation and tac left in there, so had to have surgery to removed the tac  . TONSILLECTOMY  2020  . WISDOM TOOTH EXTRACTION      Home Medications: Prior to Admission medications   Medication Sig Start Date End Date Taking? Authorizing Provider  amLODipine (NORVASC) 5 MG tablet TAKE 1 TABLET BY MOUTH DAILY. 03/21/20 03/21/21 Yes Johnson, Megan P, DO  citalopram (CELEXA) 40 MG tablet  TAKE 1 TABLET BY MOUTH DAILY. 03/05/20 03/05/21 Yes Johnson, Megan P, DO  fexofenadine (ALLEGRA) 180 MG tablet Take 1-2 tablets (180-360 mg total) by mouth daily. Patient taking differently: Take 180-360 mg by mouth daily. Takes 2-3 daily 10/21/17  Yes Johnson, Megan P, DO  finasteride (PROPECIA) 1 MG tablet Take 1 mg by mouth daily. am   Yes [provider]  Multiple Vitamin (MULTI-VITAMINS) TABS Take by mouth.   Yes [provider]  naproxen (NAPROSYN) 500 MG tablet Take 1 tablet (500 mg total) by mouth 2 (two) times daily with a  meal. Patient taking differently: Take 500 mg by mouth as needed. 04/21/19  Yes Johnson, Megan P, DO    Allergies: Allergies  Allergen Reactions  . Cat Hair Extract Hives and Other (See Comments)    Wheezing, eyes itch Wheezing, eyes itch Wheezing, eyes itch  . Other Hives and Other (See Comments)    Wheezing, eyes itch  . Galactose Other (See Comments)    Flu like symptoms after consuming mammalian products Flu like symptoms after consuming mammalian products Flu like symptoms after consuming mammalian products Flu like symptoms after consuming mammalian products    Social History:  reports that he has never smoked. He has never used smokeless tobacco. He reports previous alcohol use. He reports that he does not use drugs.   Family History: Family History  Problem Relation Age of Onset  . Stroke Mother   . Hypertension Mother   . Diabetes Mother   . Cancer Father        Throat  . Cancer Sister        Eye/Brain  . Hypertension Brother   . Irritable bowel syndrome Brother     Review of Systems: Review of Systems  Constitutional: Negative for chills and fever.  HENT: Negative for hearing loss.   Respiratory: Negative for shortness of breath.   Cardiovascular: Negative for chest pain.  Gastrointestinal: Positive for blood in stool. Negative for abdominal pain, constipation, diarrhea, nausea and vomiting.  Genitourinary: Negative for dysuria.  Skin: Negative for rash.  Neurological: Negative for dizziness.  Psychiatric/Behavioral: Negative for depression.    Physical Exam BP (!) 131/94   Pulse 83   Temp 98.9 F (37.2 C)   Ht 6\' 2"  (1.88 m)   Wt 242 lb (109.8 kg)   SpO2 98%   BMI 31.07 kg/m  CONSTITUTIONAL: No acute distress HEENT:  Normocephalic, atraumatic, extraocular motion intact. NECK: Trachea is midline, and there is no jugular venous distension.  RESPIRATORY:  Normal respiratory effort without pathologic use of accessory muscles. CARDIOVASCULAR:  Regular rhythm and rate GI: The abdomen is soft, non-distended, non-tender.  RECTAL:  External exam reveals an area of raw tissue in the posterior anal verge, extending interiorly.  It was unclear if this is the opening of a fistula vs a tear like fissure.  On probing with qtip, no true opening is seen going towards the anal canal, and there's mild oozing from the raw tissue.  There is no drainage in that area.  Anoscopy was not able to reveal an exit site from within the anal canal.  Digital rectal exam shows tenderness in the posterior area and no masses. MUSCULOSKELETAL:  Normal muscle strength and tone in all four extremities.  No peripheral edema or cyanosis. SKIN: Skin turgor is normal. There are no pathologic skin lesions.  NEUROLOGIC:  Motor and sensation is grossly normal.  Cranial nerves are grossly intact. PSYCH:  Alert and oriented to  person, place and time. Affect is normal.  Laboratory Analysis: No results found for this or any previous visit (from the past 24 hour(s)).  Imaging: MRI pelvis 08/14/20: Impression:  1. Suggestion of rectal fistula along the posterior aspect of the rectum  (below the levator ani) to the right buttock skin surface.  2. No evidenceof abscess.    Assessment and Plan: This is a 45 y.o. 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   --Discussed with the patient that I cannot fully see an opening or a track going to the anal canal, and in theory this could possibly be an anal fissure, which would explain the pain and bleeding.  However, this would not explain his night sweats and stool color drainage on his underwear, which makes a fistula more reasonable.  --Also discussed with him that if this is truly a fistula, he's already undergone one prior repair, and I think a re-do repair or surgery is beyond what I'm able to offer the patient given the higher complexity.  I think it would be better to refer the patient for this to a tertiary center.   I offered referral back to [facility] since his surgery was there initially, although Dr. Sharol Roussel is no longer there.  He would prefer to go to [facility] instead since his MRI was done there and he said he did have a bad experience with another surgical team at [facility]. --Will send referral to Colorectal surgery at [facility] for evaluation.  Follow up with [place] as needed.  Return precautions given to patient, particularly if there is any increased tenderness, purulent drainage, or suggestion of abscess.  Face-to-face time spent with the patient and care providers was 80 minutes, with more than 50% of the time spent counseling, educating, and coordinating care of the patient.     Melvyn Neth, [facility]

## 2020-08-28 NOTE — Patient Instructions (Addendum)
We will send a referral to [facility].  They will call you to schedule this appointment.     Anal Fistula  An anal fistula is a hole that develops between the bowel and the skin near the anus. The anus allows stool (feces) to leave the body. The anus has many tiny glands that make lubricating fluid. Sometimes, these glands become plugged and infected. This can cause a fluid-filled pocket (abscess) to form. An anal fistula often occurs when an abscess becomes infected and then develops into a hole between the bowel and the skin. What are the causes? In most cases, an anal fistula is caused by a past or current buildup of pus around the anus (anal abscess). Other causes include:  A complication of surgery.  Injury to the rectum or the area around it.  Using high-energy beams (radiation) to treat the area around the rectum. What increases the risk? You are more likely to develop this condition if you have certain medical conditions or diseases, including:  Chronic inflammatory bowel disease, such as Crohn's disease or ulcerative colitis.  Colon cancer or rectal cancer.  Diverticular disease, such as diverticulitis.  A sexually transmitted infection, or STI, such as gonorrhea, chlamydia, or syphilis.  An infection that is caused by HIV. What are the signs or symptoms? Symptoms of this condition include:  Throbbing or constant pain that may be worse while you are sitting.  Swelling or irritation around the anus.  Pus or blood from an opening near the anus.  Pain when passing stool.  Fever or chills. How is this diagnosed? This condition is diagnosed based on:  A physical exam. This may include: ? An exam to find the external opening of the fistula. ? An exam with a probe or scope to help locate the internal opening of the fistula. ? An exam of the rectum with a gloved hand (digital rectal exam).  Imaging tests that use dye to find the exact location and path  of the fistula. Tests may include: ? X-rays. ? Ultrasound. ? CT scan. ? MRI.  Other tests to find the cause of the anal fistula. How is this treated? This condition is most commonly treated with surgery. The type of surgery that is used will depend on where the fistula is located and how complex the fistula is. Surgery may include:  A fistulotomy. The whole fistula is opened up, and the contents are drained to promote healing.  Seton placement. A silk string (seton) is placed into the fistula during a fistulotomy. This helps to drain any infection and promote healing.  Advancement flap procedure. Tissue is removed from your rectum or the skin around the anus and attached to the opening of the fistula.  Bioprosthetic plug. A cone-shaped plug is made from your tissue and is used to block the opening of the fistula. Some anal fistulas do not require surgery. A nonsurgical treatment option involves injecting a fibrin glue to seal the fistula. You also may be prescribed an antibiotic medicine to treat any infection. Follow these instructions at home: Medicines  Take over-the-counter and prescription medicines only as told by your health care provider.  If you were prescribed an antibiotic medicine, take it as told by your health care provider. Do not stop taking the antibiotic even if you start to feel better.  Use a stool softener or a laxative if told to do so by your health care provider. General instructions  Eat a high-fiber diet as told by  your health care provider. This can help to prevent constipation.  Drink enough fluid to keep your urine pale yellow.  Take a warm sitz bath for 15-20 minutes, 3-4 times per day, or as told by your health care provider. Sitz baths can ease your pain and discomfort and help with healing.  Follow good hygiene to keep the anal area as clean and dry as possible. Use wet toilet paper or a moist towelette after each bowel movement.  Keep all  follow-up visits as told by your health care provider. This is important.   Contact a health care provider if you have:  Increased pain that is not controlled with medicines.  New redness or swelling around the anal area.  New fluid, blood, or pus coming from the anal area.  Tenderness or warmth around the anal area. Get help right away if you have:  A fever.  Severe pain.  Chills or diarrhea.  Severe problems urinating or having a bowel movement. Summary  An anal fistula is a hole that develops between the bowel and the skin near the anus.  This condition is most often caused by a buildup of pus around the anus (anal abscess). Other causes include a complication of surgery, an injury to the rectum, or the use of radiation to treat the rectal area.  This condition is most commonly treated with surgery.  Follow your health care provider's instructions about taking medicines, eating and drinking, or taking sitz baths.  Call your health care provider if you have more pain, swelling, or blood. Get help right away if you have fever, severe pain, or problems passing urine or stool. This information is not intended to replace advice given to you by your health care provider. Make sure you discuss any questions you have with your health care provider. Document Revised: 09/17/2017 Document Reviewed: 09/17/2017 Elsevier Patient Education  [place].

## 2020-09-17 DIAGNOSIS — K602 Anal fissure, unspecified: Secondary | ICD-10-CM | POA: Diagnosis not present

## 2020-09-17 DIAGNOSIS — K603 Anal fistula: Secondary | ICD-10-CM | POA: Diagnosis not present

## 2020-09-23 ENCOUNTER — Other Ambulatory Visit: Payer: Self-pay

## 2020-09-23 ENCOUNTER — Other Ambulatory Visit: Payer: Self-pay | Admitting: Family Medicine

## 2020-09-23 MED ORDER — CITALOPRAM HYDROBROMIDE 40 MG PO TABS
ORAL_TABLET | Freq: Every day | ORAL | 0 refills | Status: DC
  Filled 2020-09-23: qty 90, 90d supply, fill #0

## 2020-09-23 NOTE — Telephone Encounter (Signed)
Requested Prescriptions  Pending Prescriptions Disp Refills  . citalopram (CELEXA) 40 MG tablet 90 tablet 0    Sig: TAKE 1 TABLET BY MOUTH DAILY.     Psychiatry:  Antidepressants - SSRI Failed - 09/23/2020  3:58 PM      Failed - Valid encounter within last 6 months    Recent Outpatient Visits          6 months ago Idiopathic urticaria   [place], Megan P, DO   8 months ago Acute low back pain, unspecified back pain laterality, unspecified whether sciatica present   [facility], Megan P, DO   9 months ago Acute non-recurrent maxillary sinusitis   [facility] [place], Megan P, DO   10 months ago Erroneous encounter - disregard   [facility] [place], [place]   1 year ago Arthralgia, unspecified joint   [place], Barb Merino, DO      Future Appointments            In 3 days Wynetta Emery, Barb Merino, DO [employer], [facility]

## 2020-09-26 ENCOUNTER — Other Ambulatory Visit: Payer: Self-pay

## 2020-09-26 ENCOUNTER — Encounter: Payer: Self-pay | Admitting: Family Medicine

## 2020-09-26 ENCOUNTER — Ambulatory Visit (INDEPENDENT_AMBULATORY_CARE_PROVIDER_SITE_OTHER): Payer: 59 | Admitting: Family Medicine

## 2020-09-26 VITALS — BP 136/86 | HR 73 | Temp 98.6°F | Ht 72.5 in | Wt 237.2 lb

## 2020-09-26 DIAGNOSIS — K604 Rectal fistula: Secondary | ICD-10-CM | POA: Diagnosis not present

## 2020-09-26 DIAGNOSIS — G4700 Insomnia, unspecified: Secondary | ICD-10-CM | POA: Diagnosis not present

## 2020-09-26 DIAGNOSIS — E7521 Fabry (-Anderson) disease: Secondary | ICD-10-CM | POA: Diagnosis not present

## 2020-09-26 DIAGNOSIS — Z0000 Encounter for general adult medical examination without abnormal findings: Secondary | ICD-10-CM | POA: Diagnosis not present

## 2020-09-26 DIAGNOSIS — F419 Anxiety disorder, unspecified: Secondary | ICD-10-CM

## 2020-09-26 DIAGNOSIS — I10 Essential (primary) hypertension: Secondary | ICD-10-CM | POA: Diagnosis not present

## 2020-09-26 DIAGNOSIS — M5441 Lumbago with sciatica, right side: Secondary | ICD-10-CM | POA: Diagnosis not present

## 2020-09-26 LAB — URINALYSIS, ROUTINE W REFLEX MICROSCOPIC
Bilirubin, UA: NEGATIVE
Glucose, UA: NEGATIVE
Ketones, UA: NEGATIVE
Leukocytes,UA: NEGATIVE
Nitrite, UA: NEGATIVE
Protein,UA: NEGATIVE
RBC, UA: NEGATIVE
Specific Gravity, UA: 1.015 (ref 1.005–1.030)
Urobilinogen, Ur: 0.2 mg/dL (ref 0.2–1.0)
pH, UA: 7 (ref 5.0–7.5)

## 2020-09-26 LAB — MICROALBUMIN, URINE WAIVED
Creatinine, Urine Waived: 200 mg/dL (ref 10–300)
Microalb, Ur Waived: 10 mg/L (ref 0–19)
Microalb/Creat Ratio: <30 mg/g (ref <30)

## 2020-09-26 MED ORDER — CYCLOBENZAPRINE HCL 10 MG PO TABS
10.0000 mg | ORAL_TABLET | Freq: Every day | ORAL | 0 refills | Status: DC
Start: 2020-09-26 — End: 2020-11-01
  Filled 2020-09-26: qty 30, 30d supply, fill #0

## 2020-09-26 MED ORDER — KETOROLAC TROMETHAMINE 60 MG/2ML IM SOLN
60.0000 mg | Freq: Once | INTRAMUSCULAR | Status: AC
  Administered 2020-09-26: 60 mg via INTRAMUSCULAR

## 2020-09-26 MED ORDER — AMLODIPINE BESYLATE 5 MG PO TABS
ORAL_TABLET | Freq: Every day | ORAL | 1 refills | Status: DC
  Filled 2020-09-26: qty 90, 90d supply, fill #0
  Filled 2021-01-14: qty 90, 90d supply, fill #1

## 2020-09-26 MED ORDER — NAPROXEN 500 MG PO TABS
500.0000 mg | ORAL_TABLET | Freq: Two times a day (BID) | ORAL | 6 refills | Status: DC
  Filled 2020-09-26: qty 60, 30d supply, fill #0

## 2020-09-26 MED ORDER — FEXOFENADINE HCL 180 MG PO TABS
180.0000 mg | ORAL_TABLET | Freq: Every day | ORAL | 3 refills | Status: DC
  Filled 2020-09-26: qty 180, 90d supply, fill #0

## 2020-09-26 MED ORDER — TRAZODONE HCL 50 MG PO TABS
25.0000 mg | ORAL_TABLET | Freq: Every evening | ORAL | 3 refills | Status: DC | PRN
  Filled 2020-09-26: qty 30, 30d supply, fill #0

## 2020-09-26 MED ORDER — CITALOPRAM HYDROBROMIDE 40 MG PO TABS
ORAL_TABLET | Freq: Every day | ORAL | 0 refills | Status: DC
  Filled 2020-09-26: qty 90, fill #0
  Filled 2021-01-14: qty 90, 90d supply, fill #0

## 2020-09-26 NOTE — Patient Instructions (Addendum)
Sciatica Rehab Ask your health care provider which exercises are safe for you. Do exercises exactly as told by your health care provider and adjust them as directed. It is normal to feel mild stretching, pulling, tightness, or discomfort as you do these exercises. Stop right away if you feel sudden pain or your pain gets worse. Do not begin these exercises until told by your health care provider. Stretching and range-of-motion exercises These exercises warm up your muscles and joints and improve the movement and flexibility of your hips and back. These exercises also help to relieve pain, numbness, and tingling. Sciatic nerve glide 1. Sit in a chair with your head facing down toward your chest. Place your hands behind your back. Let your shoulders slump forward. 2. Slowly straighten one of your legs while you tilt your head back as if you are looking toward the ceiling. Only straighten your leg as far as you can without making your symptoms worse. 3. Hold this position for __________ seconds. 4. Slowly return to the starting position. 5. Repeat with your other leg. Repeat __________ times. Complete this exercise __________ times a day. Knee to chest with hip adduction and internal rotation 1. Lie on your back on a firm surface with both legs straight. 2. Bend one of your knees and move it up toward your chest until you feel a gentle stretch in your lower back and buttock. Then, move your knee toward the shoulder that is on the opposite side from your leg. This is hip adduction and internal rotation. ? Hold your leg in this position by holding on to the front of your knee. 3. Hold this position for __________ seconds. 4. Slowly return to the starting position. 5. Repeat with your other leg. Repeat __________ times. Complete this exercise __________ times a day.   Prone extension on elbows 1. Lie on your abdomen on a firm surface. A bed may be too soft for this exercise. 2. Prop yourself up on  your elbows. 3. Use your arms to help lift your chest up until you feel a gentle stretch in your abdomen and your lower back. ? This will place some of your body weight on your elbows. If this is uncomfortable, try stacking pillows under your chest. ? Your hips should stay down, against the surface that you are lying on. Keep your hip and back muscles relaxed. 4. Hold this position for __________ seconds. 5. Slowly relax your upper body and return to the starting position. Repeat __________ times. Complete this exercise __________ times a day.   Strengthening exercises These exercises build strength and endurance in your back. Endurance is the ability to use your muscles for a long time, even after they get tired. Pelvic tilt This exercise strengthens the muscles that lie deep in the abdomen. 1. Lie on your back on a firm surface. Bend your knees and keep your feet flat on the floor. 2. Tense your abdominal muscles. Tip your pelvis up toward the ceiling and flatten your lower back into the floor. ? To help with this exercise, you may place a small towel under your lower back and try to push your back into the towel. 3. Hold this position for __________ seconds. 4. Let your muscles relax completely before you repeat this exercise. Repeat __________ times. Complete this exercise __________ times a day. Alternating arm and leg raises 1. Get on your hands and knees on a firm surface. If you are on a hard floor, you may want to  use padding, such as an exercise mat, to cushion your knees. 2. Line up your arms and legs. Your hands should be directly below your shoulders, and your knees should be directly below your hips. 3. Lift your left leg behind you. At the same time, raise your right arm and straighten it in front of you. ? Do not lift your leg higher than your hip. ? Do not lift your arm higher than your shoulder. ? Keep your abdominal and back muscles tight. ? Keep your hips facing the  ground. ? Do not arch your back. ? Keep your balance carefully, and do not hold your breath. 4. Hold this position for __________ seconds. 5. Slowly return to the starting position. 6. Repeat with your right leg and your left arm. Repeat __________ times. Complete this exercise __________ times a day.   Posture and body mechanics Good posture and healthy body mechanics can help to relieve stress in your body's tissues and joints. Body mechanics refers to the movements and positions of your body while you do your daily activities. Posture is part of body mechanics. Good posture means:  Your spine is in its natural S-curve position (neutral).  Your shoulders are pulled back slightly.  Your head is not tipped forward. Follow these guidelines to improve your posture and body mechanics in your everyday activities. Standing  When standing, keep your spine neutral and your feet about hip width apart. Keep a slight bend in your knees. Your ears, shoulders, and hips should line up.  When you do a task in which you stand in one place for a long time, place one foot up on a stable object that is 2-4 inches (5-10 cm) high, such as a footstool. This helps keep your spine neutral.   Sitting  When sitting, keep your spine neutral and keep your feet flat on the floor. Use a footrest, if necessary, and keep your thighs parallel to the floor. Avoid rounding your shoulders, and avoid tilting your head forward.  When working at a desk or a computer, keep your desk at a height where your hands are slightly lower than your elbows. Slide your chair under your desk so you are close enough to maintain good posture.  When working at a computer, place your monitor at a height where you are looking straight ahead and you do not have to tilt your head forward or downward to look at the screen.   Resting  When lying down and resting, avoid positions that are most painful for you.  If you have pain with activities  such as sitting, bending, stooping, or squatting, lie in a position in which your body does not bend very much. For example, avoid curling up on your side with your arms and knees near your chest (fetal position).  If you have pain with activities such as standing for a long time or reaching with your arms, lie with your spine in a neutral position and bend your knees slightly. Try the following positions: ? Lying on your side with a pillow between your knees. ? Lying on your back with a pillow under your knees. Lifting  When lifting objects, keep your feet at least shoulder width apart and tighten your abdominal muscles.  Bend your knees and hips and keep your spine neutral. It is important to lift using the strength of your legs, not your back. Do not lock your knees straight out.  Always ask for help to lift heavy or awkward objects.  This information is not intended to replace advice given to you by your health care provider. Make sure you discuss any questions you have with your health care provider. Document Revised: 08/26/2018 Document Reviewed: 05/26/2018 Elsevier Patient Education  [place] Maintenance, Male Adopting a healthy lifestyle and getting preventive care are important in promoting health and wellness. Ask your health care provider about:  The right schedule for you to have regular tests and exams.  Things you can do on your own to prevent diseases and keep yourself healthy. What should I know about diet, weight, and exercise? Eat a healthy diet  Eat a diet that includes plenty of vegetables, fruits, low-fat dairy products, and lean protein.  Do not eat a lot of foods that are high in solid fats, added sugars, or sodium.   Maintain a healthy weight Body mass index (BMI) is a measurement that can be used to identify possible weight problems. It estimates body fat based on height and weight. Your health care provider can help determine your BMI and  help you achieve or maintain a healthy weight. Get regular exercise Get regular exercise. This is one of the most important things you can do for your health. Most adults should:  Exercise for at least 150 minutes each week. The exercise should increase your heart rate and make you sweat (moderate-intensity exercise).  Do strengthening exercises at least twice a week. This is in addition to the moderate-intensity exercise.  Spend less time sitting. Even light physical activity can be beneficial. Watch cholesterol and blood lipids Have your blood tested for lipids and cholesterol at 46 years of age, then have this test every 5 years. You may need to have your cholesterol levels checked more often if:  Your lipid or cholesterol levels are high.  You are older than 46 years of age.  You are at high risk for heart disease. What should I know about cancer screening? Many types of cancers can be detected early and may often be prevented. Depending on your health history and family history, you may need to have cancer screening at various ages. This may include screening for:  Colorectal cancer.  Prostate cancer.  Skin cancer.  Lung cancer. What should I know about heart disease, diabetes, and high blood pressure? Blood pressure and heart disease  High blood pressure causes heart disease and increases the risk of stroke. This is more likely to develop in people who have high blood pressure readings, are of African descent, or are overweight.  Talk with your health care provider about your target blood pressure readings.  Have your blood pressure checked: ? Every 3-5 years if you are 31-79 years of age. ? Every year if you are 38 years old or older.  If you are between the ages of 54 and 54 and are a current or former smoker, ask your health care provider if you should have a one-time screening for abdominal aortic aneurysm (AAA). Diabetes Have regular diabetes screenings. This  checks your fasting blood sugar level. Have the screening done:  Once every three years after age 13 if you are at a normal weight and have a low risk for diabetes.  More often and at a younger age if you are overweight or have a high risk for diabetes. What should I know about preventing infection? Hepatitis B If you have a higher risk for hepatitis B, you should be screened for this virus. Talk with your health care provider to  find out if you are at risk for hepatitis B infection. Hepatitis C Blood testing is recommended for:  Everyone born from [birth year] through 1966.  Anyone with known risk factors for hepatitis C. Sexually transmitted infections (STIs)  You should be screened each year for STIs, including gonorrhea and chlamydia, if: ? You are sexually active and are younger than 46 years of age. ? You are older than 46 years of age and your health care provider tells you that you are at risk for this type of infection. ? Your sexual activity has changed since you were last screened, and you are at increased risk for chlamydia or gonorrhea. Ask your health care provider if you are at risk.  Ask your health care provider about whether you are at high risk for HIV. Your health care provider may recommend a prescription medicine to help prevent HIV infection. If you choose to take medicine to prevent HIV, you should first get tested for HIV. You should then be tested every 3 months for as long as you are taking the medicine. Follow these instructions at home: Lifestyle  Do not use any products that contain nicotine or tobacco, such as cigarettes, e-cigarettes, and chewing tobacco. If you need help quitting, ask your health care provider.  Do not use street drugs.  Do not share needles.  Ask your health care provider for help if you need support or information about quitting drugs. Alcohol use  Do not drink alcohol if your health care provider tells you not to drink.  If you drink  alcohol: ? Limit how much you have to 0-2 drinks a day. ? Be aware of how much alcohol is in your drink. In the [place] one drink equals one 12 oz bottle of beer (355 mL), one 5 oz glass of wine (148 mL), or one 1 oz glass of hard liquor (44 mL). General instructions  Schedule regular health, dental, and eye exams.  Stay current with your vaccines.  Tell your health care provider if: ? You often feel depressed. ? You have ever been abused or do not feel safe at home. Summary  Adopting a healthy lifestyle and getting preventive care are important in promoting health and wellness.  Follow your health care provider's instructions about healthy diet, exercising, and getting tested or screened for diseases.  Follow your health care provider's instructions on monitoring your cholesterol and blood pressure. This information is not intended to replace advice given to you by your health care provider. Make sure you discuss any questions you have with your health care provider. Document Revised: 04/27/2018 Document Reviewed: 04/27/2018 Elsevier Patient Education  2021 [employer].

## 2020-09-26 NOTE — Assessment & Plan Note (Signed)
Stable. Continue to monitor and watch his diet. Call with any concerns.

## 2020-09-26 NOTE — Addendum Note (Signed)
Addended by: Louanna Raw on: 09/26/2020 02:55 PM   Modules accepted: Orders

## 2020-09-26 NOTE — Assessment & Plan Note (Signed)
Under good control on current regimen. Continue current regimen. Continue to monitor. Call with any concerns. Refills given. Labs drawn today.   

## 2020-09-26 NOTE — Progress Notes (Signed)
BP 136/86   Pulse 73   Temp 98.6 F (37 C)   Ht 6' 0.5" (1.842 m)   Wt 237 lb 3.2 oz (107.6 kg)   SpO2 97%   BMI 31.73 kg/m    Subjective:    Patient ID: John Huang, male    DOB: [birth year], 45 y.o.   MRN: [id number]  HPI: John Huang is a 45 y.o. male presenting on 09/26/2020 for comprehensive medical examination. Current medical complaints include:  Was seeing integrative medicine for his chronic joint pains. When he followed up with them in March, he was having issues with his rectum and had an MRI. He was diagnosed with another rectal fistula. He has been following with GI and rectal surgery.   BACK PAIN- threw his back out this weekend.  Duration: 5 days Mechanism of injury: lifting Location: Right and low back Onset: sudden Severity: severe Quality: pulling and clenching Frequency: constant but waxing and waning Radiation: groin Aggravating factors: walking, moving Alleviating factors: massage, dry needling, heat Status: worse Treatments attempted: rest, heat, APAP, ibuprofen, aleve, physical therapy and HEP  Relief with NSAIDs?: mild Nighttime pain:  yes Paresthesias / decreased sensation:  yes Bowel / bladder incontinence:  no Fevers:  no Dysuria / urinary frequency:  no  HYPERTENSION Hypertension status: controlled  Satisfied with current treatment? no Duration of hypertension: chronic BP monitoring frequency:  not checking BP medication side effects:  no Medication compliance: excellent compliance Previous BP meds: amlodipine Aspirin: no Recurrent headaches: no Visual changes: no Palpitations: no Dyspnea: no Chest pain: no Lower extremity edema: no Dizzy/lightheaded: no  DEPRESSION Mood status: controlled Satisfied with current treatment?: yes Symptom severity: mild  Duration of current treatment : chronic Side effects: no Medication compliance: excellent compliance Psychotherapy/counseling: no  Previous psychiatric medications:  celexa Depressed mood: no Anxious mood: no Anhedonia: no Significant weight loss or gain: no Insomnia: no  Fatigue: yes Feelings of worthlessness or guilt: no Impaired concentration/indecisiveness: no Suicidal ideations: no Hopelessness: no Crying spells: no Depression screen [facility] 2/9 09/26/2020 03/21/2020 04/21/2019 12/14/2017 10/21/2017  Decreased Interest 0 0 0 0 0  Down, Depressed, Hopeless 0 0 0 0 0  PHQ - 2 Score 0 0 0 0 0  Altered sleeping - 0 0 3 0  Tired, decreased energy - 0 0 3 0  Change in appetite - 0 0 3 3  Feeling bad or failure about yourself  - 0 0 1 0  Trouble concentrating - 0 0 1 0  Moving slowly or fidgety/restless - 0 0 1 0  Suicidal thoughts - 0 0 0 0  PHQ-9 Score - 0 0 12 3  Difficult doing work/chores - Not difficult at all Not difficult at all Somewhat difficult Not difficult at all   He currently lives with: wife and kids Interim Problems from his last visit: yes  Depression Screen done today and results listed below:  Depression screen [facility] 2/9 09/26/2020 03/21/2020 04/21/2019 12/14/2017 10/21/2017  Decreased Interest 0 0 0 0 0  Down, Depressed, Hopeless 0 0 0 0 0  PHQ - 2 Score 0 0 0 0 0  Altered sleeping - 0 0 3 0  Tired, decreased energy - 0 0 3 0  Change in appetite - 0 0 3 3  Feeling bad or failure about yourself  - 0 0 1 0  Trouble concentrating - 0 0 1 0  Moving slowly or fidgety/restless - 0 0 1 0  Suicidal thoughts - 0  0 0 0  PHQ-9 Score - 0 0 12 3  Difficult doing work/chores - Not difficult at all Not difficult at all Somewhat difficult Not difficult at all    Past Medical History:  Past Medical History:  Diagnosis Date  . Asthma    when 45 yrs old, years ago  . Bulging lumbar disc   . CAP (community acquired pneumonia) 02/18/2016  . Cholinergic urticaria   . Complication of anesthesia   . Epistaxis 05/24/2017  . GERD (gastroesophageal reflux disease)   . 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 History of septic arthritis 2000   L  knee  . Hypertension    controlled  . PONV (postoperative nausea and vomiting)   . Seasonal allergies   . Sleep apnea    CPAP in past then throat surgery, no CPAP  . Vertigo    Hx of    Surgical History:  Past Surgical History:  Procedure Laterality Date  . ANTERIOR CRUCIATE LIGAMENT REPAIR    . COLONOSCOPY WITH PROPOFOL N/A 08/16/2020   Procedure: COLONOSCOPY WITH PROPOFOL;  Surgeon: Lucilla Lame, MD;  Location: [facility];  Service: Endoscopy;  Laterality: N/A;  . ELBOW SURGERY Left 06/20/2020  . ESOPHAGOGASTRODUODENOSCOPY (EGD) WITH PROPOFOL N/A 08/16/2020   Procedure: ESOPHAGOGASTRODUODENOSCOPY (EGD) WITH PROPOFOL;  Surgeon: Lucilla Lame, MD;  Location: [place];  Service: Endoscopy;  Laterality: N/A;  . FOOT SURGERY  2020  . MEDIAL COLLATERAL LIGAMENT AND LATERAL COLLATERAL LIGAMENT REPAIR, KNEE    . SHOULDER SURGERY     dislocation and tac left in there, so had to have surgery to removed the tac  . TONSILLECTOMY  2020  . WISDOM TOOTH EXTRACTION      Medications:  Current Outpatient Medications on File Prior to Visit  Medication Sig  . finasteride (PROPECIA) 1 MG tablet Take 1 mg by mouth daily. am  . Multiple Vitamin (MULTI-VITAMINS) TABS Take by mouth.   No current facility-administered medications on file prior to visit.    Allergies:  Allergies  Allergen Reactions  . Cat Hair Extract Hives and Other (See Comments)    Wheezing, eyes itch Wheezing, eyes itch Wheezing, eyes itch  . Other Hives and Other (See Comments)    Wheezing, eyes itch  . Galactose Other (See Comments)    Flu like symptoms after consuming mammalian products Flu like symptoms after consuming mammalian products Flu like symptoms after consuming mammalian products Flu like symptoms after consuming mammalian products    Social History:  Social History   Socioeconomic History  . Marital status: Married    Spouse name: Not on file  . Number of children: Not on file  .  Years of education: Not on file  . Highest education level: Not on file  Occupational History  . Not on file  Tobacco Use  . Smoking status: Never Smoker  . Smokeless tobacco: Never Used  Vaping Use  . Vaping Use: Never used  Substance and Sexual Activity  . Alcohol use: Not Currently  . Drug use: Never  . Sexual activity: Yes    Birth control/protection: None  Other Topics Concern  . Not on file  Social History Narrative  . Not on file   Social Determinants of Health   Financial Resource Strain: Not on file  Food Insecurity: Not on file  Transportation Needs: Not on file  Physical Activity: Not on file  Stress: Not on file  Social Connections: Not on file  Intimate Partner Violence: Not on file   Social History   Tobacco Use  Smoking Status Never Smoker  Smokeless Tobacco Never Used   Social History   Substance and Sexual Activity  Alcohol Use Not Currently    Family History:  Family History  Problem Relation Age of Onset  . Stroke Mother   . Hypertension Mother   . Diabetes Mother   . Cancer Father        Throat  . Cancer Sister        Eye/Brain  . Hypertension Brother   . Irritable bowel syndrome Brother     Past medical history, surgical history, medications, allergies, family history and social history reviewed with patient today and changes made to appropriate areas of the chart.   Review of Systems  Constitutional: Positive for diaphoresis and malaise/fatigue. Negative for chills, fever and weight loss.       Severe night sweats  HENT: Negative.   Eyes: Negative.   Respiratory: Positive for cough. Negative for hemoptysis, sputum production, shortness of breath and wheezing.   Cardiovascular: Positive for leg swelling. Negative for chest pain, palpitations, orthopnea, claudication and PND.  Gastrointestinal: Positive for abdominal pain, blood in stool, diarrhea, heartburn, nausea and vomiting. Negative for constipation and melena.  Genitourinary:  Negative.        Has been a bit bubbly when he pees  Musculoskeletal: Positive for back pain, joint pain and myalgias. Negative for falls and neck pain.  Skin: Positive for itching (inside his ears and nose). Negative for rash.  Neurological: Negative.   Endo/Heme/Allergies: Negative.   Psychiatric/Behavioral: Negative for depression, hallucinations, memory loss, substance abuse and suicidal ideas. The patient has insomnia. The patient is not nervous/anxious.    All other ROS negative except what is listed above and in the HPI.      Objective:    BP 136/86   Pulse 73   Temp 98.6 F (37 C)   Ht 6' 0.5" (1.842 m)   Wt 237 lb 3.2 oz (107.6 kg)   SpO2 97%   BMI 31.73 kg/m   Wt Readings from Last 3 Encounters:  09/26/20 237 lb 3.2 oz (107.6 kg)  08/28/20 242 lb (109.8 kg)  08/16/20 240 lb (108.9 kg)    Physical Exam Vitals and nursing note reviewed.  Constitutional:      General: He is not in acute distress.    Appearance: Normal appearance. He is obese. He is not ill-appearing, toxic-appearing or diaphoretic.  HENT:     Head: Normocephalic and atraumatic.     Right Ear: Tympanic membrane, ear canal and external ear normal. There is no impacted cerumen.     Left Ear: Tympanic membrane, ear canal and external ear normal. There is no impacted cerumen.     Nose: Nose normal. No congestion or rhinorrhea.     Mouth/Throat:     Mouth: Mucous membranes are moist.     Pharynx: Oropharynx is clear. No oropharyngeal exudate or posterior oropharyngeal erythema.  Eyes:     General: No scleral icterus.       Right eye: No discharge.        Left eye: No discharge.     Extraocular Movements: Extraocular movements intact.     Conjunctiva/sclera: Conjunctivae normal.     Pupils: Pupils are equal, round, and reactive to light.  Neck:     Vascular: No carotid bruit.  Cardiovascular:     Rate and Rhythm: Normal rate and regular rhythm.  Pulses: Normal pulses.     Heart sounds: No murmur  heard. No friction rub. No gallop.   Pulmonary:     Effort: Pulmonary effort is normal. No respiratory distress.     Breath sounds: Normal breath sounds. No stridor. No wheezing, rhonchi or rales.  Chest:     Chest wall: No tenderness.  Abdominal:     General: Abdomen is flat. Bowel sounds are normal. There is no distension.     Palpations: Abdomen is soft. There is no mass.     Tenderness: There is no abdominal tenderness. There is no right CVA tenderness, left CVA tenderness, guarding or rebound.     Hernia: No hernia is present.  Genitourinary:    Comments: Genital exam deferred with shared decision making Musculoskeletal:        General: Tenderness (tenderness and hypertonicity on R low back and buttock) present. No swelling, deformity or signs of injury.     Cervical back: Normal range of motion and neck supple. No rigidity. No muscular tenderness.     Right lower leg: No edema.     Left lower leg: No edema.  Lymphadenopathy:     Cervical: No cervical adenopathy.  Skin:    General: Skin is warm and dry.     Capillary Refill: Capillary refill takes less than 2 seconds.     Coloration: Skin is not jaundiced or pale.     Findings: No bruising, erythema, lesion or rash.  Neurological:     General: No focal deficit present.     Mental Status: He is alert and oriented to person, place, and time.     Cranial Nerves: No cranial nerve deficit.     Sensory: No sensory deficit.     Motor: No weakness.     Coordination: Coordination normal.     Gait: Gait normal.     Deep Tendon Reflexes: Reflexes normal.  Psychiatric:        Mood and Affect: Mood normal.        Behavior: Behavior normal.        Thought Content: Thought content normal.        Judgment: Judgment normal.     Results for orders placed or performed during the hospital encounter of 08/16/20  Surgical pathology  Result Value Ref Range   SURGICAL PATHOLOGY      SURGICAL PATHOLOGY CASE: [id number] PATIENT: Oralia Manis Surgical Pathology Report     Specimen Submitted: A. Stomach, antrum; cbx  Clinical History: Hematochezia K92.1.  Gastritis and rectal fissure      DIAGNOSIS: A. STOMACH, ANTRUM; COLD BIOPSY: - CHRONIC AND FOCALLY ACTIVE GASTRITIS. - NEGATIVE FOR HELICOBACTER PYLORI BY IMMUNOHISTOCHEMISTRY. - NEGATIVE FOR INTESTINAL METAPLASIA, DYSPLASIA, AND MALIGNANCY.  Comment: Immunohistochemical stain for H pylori is negative.  IHC slides were prepared by Launa Grill, [place]. All controls stained appropriately.  This test was developed and its performance characteristics determined by [facility]. It has not been cleared or approved by the [place] Food and Drug Administration. The FDA does not require this test to go through premarket FDA review. This test is used for clinical purposes. It should not be regarded as investigational or for research. This laboratory is certified under the [place] (CLIA) as qualified to perform high complexity clinical laboratory testing.   GROSS DESCRIPTION: A. Labeled: Gastric antrum-cold biopsy Received: In formalin Collection time: 7:33 on 08/16/20 Placed into formalin time: 7:33 AM on 08/16/20 Tissue fragment(s): 3 Size: 0.7 x 0.3  x 0.2 cm Description: Aggregate of tan tissue fragments Entirely submitted in 1 cassette.  BD 08/16/2020 Entirely submitted in 1 cassette. : Gastric antrum-cold biopsy Received: In formalin Collection time: 7:33 AM on 08/16/20 Placed into formalin time: 7:33 AM on 08/16/20 Tissue fragment(s): 3 Size: 0.7 x 0.35 cm Description: Aggregate of tissue fragments Entirely submitted in 1 cassette. A. Labeled: Gastric antrum-cold biopsy Received: In formalin Collection time: 7:33 AM on 08/16/20 Placed into formalin time: 7:33 AM on 08/16/20 Tissue fragment(s): 3 Size: 0.7 x 0.35 cm Description: Aggregate of tissue fragments Entirely submitted in 1 cassette. A. Labeled: Gastric antrum-c old  biopsy Received: In formalin Collection time: 7:33 AM on 08/16/20 Placed into formalin time: 7:33 AM on 08/16/20 Tissue fragment(s): 3 Size: 0.7 x 0.35 cm Description: Aggregate of tissue fragments Entirely submitted in 1 cassette. : Gastric antrum-cold biopsy Received: In formalin Collection time: 7:33 AM on 08/16/20 Placed into formalin time: 7:33 AM on 08/16/20 Tissue fragment(s): 3 Size: 0.7 x 0.35 cm Description: Aggregate of tissue fragments Entirely submitted in 1 cassette. formalin time: 7:33 AM on 08/16/20 Tissue fragment(s): 3 Size: 0.7 x 0.35 cm Description: Aggregate of tissue fragments Entirely submitted in 1 cassette. Size: 0.7 x 0.35 cm Description: Aggregate of tissue fragments Entirely submitted in 1 cassette.1/224/1/22A. Labeled: Gastric antrum-cold biopsy Received: In formalin Collection time: 7:33 AM on 08/16/20 Placed into formalin time: 7:33 AM on 08/16/20 Tissue fragment(s): 3 Size: 0.7 x 0.35 cm Description: Aggregate of tissue fragments Enti rely submitted in 1 cassette.  Final Diagnosis performed by Betsy Pries, MD.   Electronically signed 08/20/2020 9:57:01AM The electronic signature indicates that the named Attending Pathologist has evaluated the specimen Technical component performed at [facility], [address] [place], [place] [address] Lab: [phone] Dir: Rush Farmer, MD, MMM  Professional component performed at [facility], [facility], [place], [place], [place] [address] Lab: [phone] Dir: Dellia Nims. Reuel Derby, MD       Assessment & Plan:   Problem List Items Addressed This Visit      Cardiovascular and Mediastinum   Hypertension    Under good control on current regimen. Continue current regimen. Continue to monitor. Call with any concerns. Refills given. Labs drawn today.        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 Continue to monitor and watch his diet. Call with any concerns.       Relevant Medications   amLODipine (NORVASC) 5 MG tablet     Other   Anxiety    Under good control on current regimen. Continue current regimen. Continue to monitor. Call with any concerns. Refills given. Labs drawn today.        Relevant Medications   traZODone (DESYREL) 50 MG tablet   citalopram (CELEXA) 40 MG tablet    Other Visit Diagnoses    Routine general medical examination at a health care facility    -  Primary   Vaccines up to date. Screening labs checked today. Colonoscopy up to date. Continue diet and exercise. Call with any concerns.    Relevant Orders   Comprehensive metabolic panel   CBC with Differential/Platelet   Lipid Panel w/o Chol/HDL Ratio   PSA   TSH   Urinalysis, Routine w reflex microscopic   Microalbumin, Urine Waived   Hepatitis C Antibody   Acute right-sided low back pain with right-sided  sciatica       Will treat with toradol and flexeril and stretches. Recheck 1 month. Call with any concerns. continue to monitor.    Relevant Medications   traZODone (DESYREL) 50 MG tablet   ketorolac (TORADOL) injection 60 mg (Completed)   citalopram (CELEXA) 40 MG tablet   naproxen (NAPROSYN) 500 MG tablet   cyclobenzaprine (FLEXERIL) 10 MG tablet   Insomnia, unspecified type       Will start trazodone. Recheck 1 month. Call with any concerns.    Perirectal fistula       To have surgery next month. Continue to follow with rectal surgery. Call with any concerns. Continue to monitor.        Discussed aspirin prophylaxis for myocardial infarction prevention and decision was it was not indicated  LABORATORY TESTING:  Health maintenance labs ordered today as discussed above.   The natural history of prostate cancer and ongoing controversy regarding screening and potential treatment outcomes of prostate cancer has been discussed with the patient. The meaning of a  false positive PSA and a false negative PSA has been discussed. He indicates understanding of the limitations of this screening test and wishes to proceed with screening PSA testing.  IMMUNIZATIONS:   - Tdap: Tetanus vaccination status reviewed: last tetanus booster within 10 years. - Influenza: Up to date - Pneumovax: Not applicable - COVID: Up to date  SCREENING: - Colonoscopy: Up to date  Discussed with patient purpose of the colonoscopy is to detect colon cancer at curable precancerous or early stages   PATIENT COUNSELING:    Sexuality: Discussed sexually transmitted diseases, partner selection, use of condoms, avoidance of unintended pregnancy  and contraceptive alternatives.   Advised to avoid cigarette smoking.  I discussed with the patient that most people either abstain from alcohol or drink within safe limits (<=14/week and <=4 drinks/occasion for males, <=7/weeks and <= 3 drinks/occasion for females) and that the risk for alcohol disorders and other health effects rises proportionally with the number of drinks per week and how often a drinker exceeds daily limits.  Discussed cessation/primary prevention of drug use and availability of treatment for abuse.   Diet: Encouraged to adjust caloric intake to maintain  or achieve ideal body weight, to reduce intake of dietary saturated fat and total fat, to limit sodium intake by avoiding high sodium foods and not adding table salt, and to maintain adequate dietary potassium and calcium preferably from fresh fruits, vegetables, and low-fat dairy products.    stressed the importance of regular exercise  Injury prevention: Discussed safety belts, safety helmets, smoke detector, smoking near bedding or upholstery.   Dental health: Discussed importance of regular tooth brushing, flossing, and dental visits.   Follow up plan: NEXT PREVENTATIVE PHYSICAL DUE IN 1 YEAR. Return in about 4 weeks (around 10/24/2020).

## 2020-09-27 ENCOUNTER — Encounter: Payer: Self-pay | Admitting: Family Medicine

## 2020-09-27 LAB — COMPREHENSIVE METABOLIC PANEL
ALT: 34 IU/L (ref 0–44)
AST: 23 IU/L (ref 0–40)
Albumin/Globulin Ratio: 1.5 (ref 1.2–2.2)
Albumin: 4.7 g/dL (ref 4.0–5.0)
Alkaline Phosphatase: 68 IU/L (ref 44–121)
BUN/Creatinine Ratio: 13 (ref 9–20)
BUN: 14 mg/dL (ref 6–24)
Bilirubin Total: 0.2 mg/dL (ref 0.0–1.2)
CO2: 21 mmol/L (ref 20–29)
Calcium: 9.6 mg/dL (ref 8.7–10.2)
Chloride: 105 mmol/L (ref 96–106)
Creatinine, Ser: 1.1 mg/dL (ref 0.76–1.27)
Globulin, Total: 3.2 g/dL (ref 1.5–4.5)
Glucose: 84 mg/dL (ref 65–99)
Potassium: 4.5 mmol/L (ref 3.5–5.2)
Sodium: 141 mmol/L (ref 134–144)
Total Protein: 7.9 g/dL (ref 6.0–8.5)
eGFR: 84 mL/min/{1.73_m2} (ref >59)

## 2020-09-27 LAB — CBC WITH DIFFERENTIAL/PLATELET
Basophils Absolute: 0 10*3/uL (ref 0.0–0.2)
Basos: 1 %
EOS (ABSOLUTE): 0.1 10*3/uL (ref 0.0–0.4)
Eos: 3 %
Hematocrit: 45.1 % (ref 37.5–51.0)
Hemoglobin: 14.7 g/dL (ref 13.0–17.7)
Immature Grans (Abs): 0 10*3/uL (ref 0.0–0.1)
Immature Granulocytes: 0 %
Lymphocytes Absolute: 2.1 10*3/uL (ref 0.7–3.1)
Lymphs: 48 %
MCH: 28.7 pg (ref 26.6–33.0)
MCHC: 32.6 g/dL (ref 31.5–35.7)
MCV: 88 fL (ref 79–97)
Monocytes Absolute: 0.3 10*3/uL (ref 0.1–0.9)
Monocytes: 7 %
Neutrophils Absolute: 1.8 10*3/uL (ref 1.4–7.0)
Neutrophils: 41 %
Platelets: 242 10*3/uL (ref 150–450)
RBC: 5.13 x10E6/uL (ref 4.14–5.80)
RDW: 15.1 % (ref 11.6–15.4)
WBC: 4.4 10*3/uL (ref 3.4–10.8)

## 2020-09-27 LAB — LIPID PANEL W/O CHOL/HDL RATIO
Cholesterol, Total: 173 mg/dL (ref 100–199)
HDL: 36 mg/dL — ABNORMAL LOW (ref >39)
LDL Chol Calc (NIH): 118 mg/dL — ABNORMAL HIGH (ref 0–99)
Triglycerides: 101 mg/dL (ref 0–149)
VLDL Cholesterol Cal: 19 mg/dL (ref 5–40)

## 2020-09-27 LAB — TSH: TSH: 1.18 u[IU]/mL (ref 0.450–4.500)

## 2020-09-27 LAB — HEPATITIS C ANTIBODY: Hep C Virus Ab: <0.1 s/co ratio (ref 0.0–0.9)

## 2020-09-27 LAB — PSA: Prostate Specific Ag, Serum: 0.9 ng/mL (ref 0.0–4.0)

## 2020-10-15 ENCOUNTER — Encounter: Payer: Self-pay | Admitting: Family Medicine

## 2020-10-28 DIAGNOSIS — K603 Anal fistula: Secondary | ICD-10-CM | POA: Diagnosis not present

## 2020-10-28 DIAGNOSIS — Z791 Long term (current) use of non-steroidal anti-inflammatories (NSAID): Secondary | ICD-10-CM | POA: Diagnosis not present

## 2020-10-28 DIAGNOSIS — K602 Anal fissure, unspecified: Secondary | ICD-10-CM | POA: Diagnosis not present

## 2020-10-28 DIAGNOSIS — I10 Essential (primary) hypertension: Secondary | ICD-10-CM | POA: Diagnosis not present

## 2020-10-28 DIAGNOSIS — Z888 Allergy status to other drugs, medicaments and biological substances status: Secondary | ICD-10-CM | POA: Diagnosis not present

## 2020-10-28 DIAGNOSIS — Z79899 Other long term (current) drug therapy: Secondary | ICD-10-CM | POA: Diagnosis not present

## 2020-10-28 DIAGNOSIS — K219 Gastro-esophageal reflux disease without esophagitis: Secondary | ICD-10-CM | POA: Diagnosis not present

## 2020-11-01 ENCOUNTER — Other Ambulatory Visit: Payer: Self-pay | Admitting: Family Medicine

## 2020-11-04 MED ORDER — CYCLOBENZAPRINE HCL 10 MG PO TABS: 10.0000 mg | ORAL_TABLET | Freq: Every day | ORAL | 1 refills | Status: DC

## 2020-11-07 ENCOUNTER — Ambulatory Visit: Payer: 59 | Admitting: Family Medicine

## 2020-11-07 ENCOUNTER — Ambulatory Visit: Payer: Self-pay | Admitting: Family Medicine

## 2020-11-07 ENCOUNTER — Encounter: Payer: Self-pay | Admitting: Family Medicine

## 2020-11-07 ENCOUNTER — Other Ambulatory Visit: Payer: Self-pay

## 2020-11-07 VITALS — BP 132/89 | HR 68 | Wt 237.2 lb

## 2020-11-07 DIAGNOSIS — M5441 Lumbago with sciatica, right side: Secondary | ICD-10-CM | POA: Diagnosis not present

## 2020-11-07 DIAGNOSIS — L501 Idiopathic urticaria: Secondary | ICD-10-CM | POA: Diagnosis not present

## 2020-11-07 DIAGNOSIS — K604 Rectal fistula: Secondary | ICD-10-CM

## 2020-11-07 MED ORDER — OMEPRAZOLE 40 MG PO CPDR: 40.0000 mg | DELAYED_RELEASE_CAPSULE | Freq: Every day | ORAL | 3 refills | Status: DC

## 2020-11-07 MED ORDER — TRAZODONE HCL 50 MG PO TABS: 25.0000 mg | ORAL_TABLET | Freq: Every evening | ORAL | 1 refills | Status: DC | PRN

## 2020-11-07 MED ORDER — ONDANSETRON 4 MG PO TBDP: 4.0000 mg | ORAL_TABLET | Freq: Three times a day (TID) | ORAL | 6 refills | Status: DC | PRN

## 2020-11-07 MED ORDER — CLOTRIMAZOLE-BETAMETHASONE 1-0.05 % EX CREA: 1.0000 "application " | TOPICAL_CREAM | Freq: Every day | CUTANEOUS | 0 refills | Status: DC

## 2020-11-07 NOTE — Progress Notes (Signed)
BP 132/89   Pulse 68   Wt 237 lb 3.2 oz (107.6 kg)   SpO2 98%   BMI 31.73 kg/m    Subjective:    Patient ID: John Huang, male    DOB: May 10, 1976, 45 y.o.   MRN: [id number]  HPI: John Huang is a 45 y.o. male  Chief Complaint  Patient presents with   Insomnia   Sciatica   Went to have his procedure and the surgeon didn't find anything. He is confused as he has had several other providers say that there was something there. He has not felt well since the surgery and continues with the night sweats. He has not been feeling well- was nauseous and threw up last week. He notes that his epidural messed with his back when he had surgery. Back has not been doing well.   INSOMNIA Duration: chronic Satisfied with sleep quality: yes Difficulty falling asleep: no Difficulty staying asleep: no Waking a few hours after sleep onset: no Early morning awakenings: yes Daytime hypersomnolence: no Wakes feeling refreshed: no Good sleep hygiene: yes Apnea: no Snoring: no Depressed/anxious mood: no Recent stress: yes Restless legs/nocturnal leg cramps: no Chronic pain/arthritis: no History of sleep study: yes Treatments attempted: melatonin, uinsom, and benadryl   SKIN LESION Duration: weeks Location: chest Painful: no Itching: yes Onset: sudden Context: not changing History of skin cancer: no History of precancerous skin lesions: no Family history of skin cancer: no   Relevant past medical, surgical, family and social history reviewed and updated as indicated. Interim medical history since our last visit reviewed. Allergies and medications reviewed and updated.  Review of Systems  Constitutional: Negative.   HENT: Negative.    Respiratory: Negative.    Cardiovascular: Negative.   Gastrointestinal:  Positive for abdominal distention, abdominal pain, nausea and vomiting. Negative for anal bleeding, blood in stool, constipation, diarrhea and rectal pain.   Musculoskeletal:  Positive for back pain and myalgias. Negative for arthralgias, gait problem, joint swelling and neck pain.  Skin: Negative.   Psychiatric/Behavioral: Negative.     Per HPI unless specifically indicated above     Objective:    BP 132/89   Pulse 68   Wt 237 lb 3.2 oz (107.6 kg)   SpO2 98%   BMI 31.73 kg/m   Wt Readings from Last 3 Encounters:  11/07/20 237 lb 3.2 oz (107.6 kg)  09/26/20 237 lb 3.2 oz (107.6 kg)  08/28/20 242 lb (109.8 kg)    Physical Exam Vitals and nursing note reviewed.  Constitutional:      General: He is not in acute distress.    Appearance: Normal appearance. He is not ill-appearing, toxic-appearing or diaphoretic.  HENT:     Head: Normocephalic and atraumatic.     Right Ear: External ear normal.     Left Ear: External ear normal.     Nose: Nose normal.     Mouth/Throat:     Mouth: Mucous membranes are moist.     Pharynx: Oropharynx is clear.  Eyes:     General: No scleral icterus.       Right eye: No discharge.        Left eye: No discharge.     Extraocular Movements: Extraocular movements intact.     Conjunctiva/sclera: Conjunctivae normal.     Pupils: Pupils are equal, round, and reactive to light.  Cardiovascular:     Rate and Rhythm: Normal rate and regular rhythm.     Pulses: Normal pulses.  Heart sounds: Normal heart sounds. No murmur heard.   No friction rub. No gallop.  Pulmonary:     Effort: Pulmonary effort is normal. No respiratory distress.     Breath sounds: Normal breath sounds. No stridor. No wheezing, rhonchi or rales.  Chest:     Chest wall: No tenderness.  Musculoskeletal:        General: Normal range of motion.     Cervical back: Normal range of motion and neck supple.  Skin:    General: Skin is warm and dry.     Capillary Refill: Capillary refill takes less than 2 seconds.     Coloration: Skin is not jaundiced or pale.     Findings: No bruising, erythema, lesion or rash.  Neurological:      General: No focal deficit present.     Mental Status: He is alert and oriented to person, place, and time. Mental status is at baseline.  Psychiatric:        Mood and Affect: Mood normal.        Behavior: Behavior normal.        Thought Content: Thought content normal.        Judgment: Judgment normal.    Results for orders placed or performed in visit on 09/26/20  Comprehensive metabolic panel  Result Value Ref Range   Glucose 84 65 - 99 mg/dL   BUN 14 6 - 24 mg/dL   Creatinine, Ser 1.10 0.76 - 1.27 mg/dL   eGFR 84 >59 mL/min/1.73   BUN/Creatinine Ratio 13 9 - 20   Sodium 141 134 - 144 mmol/L   Potassium 4.5 3.5 - 5.2 mmol/L   Chloride 105 96 - 106 mmol/L   CO2 21 20 - 29 mmol/L   Calcium 9.6 8.7 - 10.2 mg/dL   Total Protein 7.9 6.0 - 8.5 g/dL   Albumin 4.7 4.0 - 5.0 g/dL   Globulin, Total 3.2 1.5 - 4.5 g/dL   Albumin/Globulin Ratio 1.5 1.2 - 2.2   Bilirubin Total 0.2 0.0 - 1.2 mg/dL   Alkaline Phosphatase 68 44 - 121 IU/L   AST 23 0 - 40 IU/L   ALT 34 0 - 44 IU/L  CBC with Differential/Platelet  Result Value Ref Range   WBC 4.4 3.4 - 10.8 x10E3/uL   RBC 5.13 4.14 - 5.80 x10E6/uL   Hemoglobin 14.7 13.0 - 17.7 g/dL   Hematocrit 45.1 37.5 - 51.0 %   MCV 88 79 - 97 fL   MCH 28.7 26.6 - 33.0 pg   MCHC 32.6 31.5 - 35.7 g/dL   RDW 15.1 11.6 - 15.4 %   Platelets 242 150 - 450 x10E3/uL   Neutrophils 41 Not Estab. %   Lymphs 48 Not Estab. %   Monocytes 7 Not Estab. %   Eos 3 Not Estab. %   Basos 1 Not Estab. %   Neutrophils Absolute 1.8 1.4 - 7.0 x10E3/uL   Lymphocytes Absolute 2.1 0.7 - 3.1 x10E3/uL   Monocytes Absolute 0.3 0.1 - 0.9 x10E3/uL   EOS (ABSOLUTE) 0.1 0.0 - 0.4 x10E3/uL   Basophils Absolute 0.0 0.0 - 0.2 x10E3/uL   Immature Granulocytes 0 Not Estab. %   Immature Grans (Abs) 0.0 0.0 - 0.1 x10E3/uL  Lipid Panel w/o Chol/HDL Ratio  Result Value Ref Range   Cholesterol, Total 173 100 - 199 mg/dL   Triglycerides 101 0 - 149 mg/dL   HDL 36 (L) >39 mg/dL   VLDL  Cholesterol Cal 19 5 - 40  mg/dL   LDL Chol Calc (NIH) 118 (H) 0 - 99 mg/dL  PSA  Result Value Ref Range   Prostate Specific Ag, Serum 0.9 0.0 - 4.0 ng/mL  TSH  Result Value Ref Range   TSH 1.180 0.450 - 4.500 uIU/mL  Hepatitis C Antibody  Result Value Ref Range   Hep C Virus Ab <0.1 0.0 - 0.9 s/co ratio  Urinalysis, Routine w reflex microscopic  Result Value Ref Range   Specific Gravity, UA 1.015 1.005 - 1.030   pH, UA 7.0 5.0 - 7.5   Color, UA Yellow Yellow   Appearance Ur Clear Clear   Leukocytes,UA Negative Negative   Protein,UA Negative Negative/Trace   Glucose, UA Negative Negative   Ketones, UA Negative Negative   RBC, UA Negative Negative   Bilirubin, UA Negative Negative   Urobilinogen, Ur 0.2 0.2 - 1.0 mg/dL   Nitrite, UA Negative Negative  Microalbumin, Urine Waived (STAT)  Result Value Ref Range   Microalb, Ur Waived 10 0 - 19 mg/L   Creatinine, Urine Waived 200 10 - 300 mg/dL   Microalb/Creat Ratio <30 <30 mg/g      Assessment & Plan:   Problem List Items Addressed This Visit       Musculoskeletal and Integument   Idiopathic urticaria    Will send through lortisone. Call if not getting better or getting worse.        Other Visit Diagnoses     Perirectal fistula    -  Primary   Would like a 2nd opinion as the last rectal surgeon did not find anything. Referral placed today. Await their input.    Relevant Orders   Ambulatory referral to Colorectal Surgery   Acute right-sided low back pain with right-sided sciatica       In exacerbation. Will start naproxen and exercises. Call if not getting better or getting worse. Continue to monitor.    Relevant Medications   traZODone (DESYREL) 50 MG tablet        Follow up plan: Return 4-8 weeks.

## 2020-11-10 ENCOUNTER — Encounter: Payer: Self-pay | Admitting: Family Medicine

## 2020-11-10 NOTE — Assessment & Plan Note (Signed)
Will send through lortisone. Call if not getting better or getting worse.

## 2020-11-15 ENCOUNTER — Ambulatory Visit: Payer: Self-pay | Admitting: *Deleted

## 2020-11-15 DIAGNOSIS — R42 Dizziness and giddiness: Secondary | ICD-10-CM | POA: Diagnosis not present

## 2020-11-15 DIAGNOSIS — L509 Urticaria, unspecified: Secondary | ICD-10-CM | POA: Diagnosis not present

## 2020-11-15 DIAGNOSIS — R519 Headache, unspecified: Secondary | ICD-10-CM | POA: Diagnosis not present

## 2020-11-15 DIAGNOSIS — I10 Essential (primary) hypertension: Secondary | ICD-10-CM | POA: Diagnosis not present

## 2020-11-15 DIAGNOSIS — R253 Fasciculation: Secondary | ICD-10-CM | POA: Diagnosis not present

## 2020-11-15 DIAGNOSIS — Z79899 Other long term (current) drug therapy: Secondary | ICD-10-CM | POA: Diagnosis not present

## 2020-11-15 NOTE — Telephone Encounter (Signed)
Pt called in with multiple issues.    No protocol chosen due to multiple issues.      He had a procedure done 2 weeks ago where he was supposed to have surgery for a rectal fistula.  (10/28/2020)  When he woke up the surgeon said he didn't do the surgery because he could not locate a fistula.   He gave him Botox injections instead.   Pt was given spinal anesthesia for the procedure in his lower back.    Since that injection in his back he is having a lot of twinging sensations.  He is having bad night sweats.   "I wonder if I have an infection where they did the shot in my spine".  He was sitting on his couch and felt a crawling sensation on his lower legs from the knees down.   When  I looked at them they were tremoring.    A few days later I was outside and my wife noticed I had red circles on my calves.   They were not raised.   I took some Benadryl and they went away the red circles.  Today the muscle tremors have started again just as before and I'm lightheaded and have a headache.   I feel like I have pins and needles in my calves.   It feels like when your leg falls asleep.    My wife said the muscles are fasciculating.  He mentioned she  is a physical therapist, I believe.  When this started happening today he called the center where they did the Botox injections thinking it was associated with the Botox injections.   They told him this was happening too far out for it to be related to the Botox and to contact his primary care physician.  "That's why I'm calling y'all".  Also he said yesterday he experienced chest pain that lasted 1-2 hours that went away on its own.  "It felt like you swallow a potato chip or something".   It was a discomfort.   "I don't think it has anything to do with my heart but I don't know".  He then mentioned while on the phone with me he feels like the blood in pooling in his calves.   "I feel like the circulation from my knees down is not working".   "It's hard to  explain".    "My calves ache also right now".    At this point I referred him to the ED due to the recent chest pain and multiple neurological/circulation symptoms going on in his legs.   He asked if he could go to an urgent care instead however I let him know due to the cardiac issues they would examine as well as the other issues with his legs and the Botox he had been given it was probably better to go to the ED where they can do scans, tests, etc more than can be done at an urgent care.   He was agreeable to going to the ED.  At first he said he would go to [facility] but then mentioned going to [facility].   He was talking with his wife in the background and they have decided to go to [facility] ED.   I let him know I would pass this information along to Dr. Park Liter so she would be aware of the situation.    He thanked me for my help.  I sent my notes to  Page.

## 2020-11-15 NOTE — Telephone Encounter (Signed)
FYI pt is going to ED

## 2020-11-15 NOTE — Telephone Encounter (Signed)
Pt called in.     My most recent procedure was 2 wks ago.   I had surgery for a anal fissure.   After surgery nothing was done because there wasn't a fistula.   He gave me Botox instead.  I was sitting on my couch.   I felt like something was crawling on my legs.   I looked down and my legs were tremoring.  A few days later I was outside and my wife noticed I had red circles on my halves.   I took Benadryl and those went away. To day the muscle tremors started again.  I'm lightheaded and have a headache too.  Now my legs are tremoring again.  I have pins and needles in my calf muscles.  During my procedure they gave me a spinal injection.   My back has been twinging a lot since then.    I'm having bad night sweats.  I called the colorectal people and they told me it wasn't the Botox.  So I'm calling y'all.  Yesterday I was having chest pain for 1-2 hours then it went away.   It felt like you swallow a potato chip or something.   It's a discomfort.   I feel like my circulation form the knees down is not working.   I can feel the blood pooling in my calves.   My calves are aching.

## 2020-11-16 DIAGNOSIS — L509 Urticaria, unspecified: Secondary | ICD-10-CM | POA: Diagnosis not present

## 2020-11-16 DIAGNOSIS — R253 Fasciculation: Secondary | ICD-10-CM | POA: Diagnosis not present

## 2020-11-16 DIAGNOSIS — R42 Dizziness and giddiness: Secondary | ICD-10-CM | POA: Diagnosis not present

## 2020-11-16 DIAGNOSIS — I10 Essential (primary) hypertension: Secondary | ICD-10-CM | POA: Diagnosis not present

## 2020-11-16 DIAGNOSIS — Z79899 Other long term (current) drug therapy: Secondary | ICD-10-CM | POA: Diagnosis not present

## 2020-11-16 DIAGNOSIS — R519 Headache, unspecified: Secondary | ICD-10-CM | POA: Diagnosis not present

## 2020-12-05 ENCOUNTER — Other Ambulatory Visit: Payer: Self-pay

## 2020-12-05 ENCOUNTER — Encounter: Payer: Self-pay | Admitting: Family Medicine

## 2020-12-05 ENCOUNTER — Ambulatory Visit: Payer: 59 | Admitting: Family Medicine

## 2020-12-05 VITALS — BP 129/85 | HR 69 | Temp 98.3°F | Wt 239.0 lb

## 2020-12-05 DIAGNOSIS — R202 Paresthesia of skin: Secondary | ICD-10-CM

## 2020-12-05 DIAGNOSIS — R1013 Epigastric pain: Secondary | ICD-10-CM | POA: Diagnosis not present

## 2020-12-05 DIAGNOSIS — M5126 Other intervertebral disc displacement, lumbar region: Secondary | ICD-10-CM

## 2020-12-05 DIAGNOSIS — K604 Rectal fistula: Secondary | ICD-10-CM | POA: Diagnosis not present

## 2020-12-05 DIAGNOSIS — R253 Fasciculation: Secondary | ICD-10-CM | POA: Diagnosis not present

## 2020-12-05 DIAGNOSIS — M5136 Other intervertebral disc degeneration, lumbar region: Secondary | ICD-10-CM

## 2020-12-05 MED ORDER — GABAPENTIN 100 MG PO CAPS: 100.0000 mg | ORAL_CAPSULE | Freq: Three times a day (TID) | ORAL | 3 refills | Status: DC

## 2020-12-05 NOTE — Progress Notes (Signed)
BP 129/85 (BP Location: Left Arm, Cuff Size: Normal)   Pulse 69   Temp 98.3 F (36.8 C) (Oral)   Wt 239 lb (108.4 kg)   SpO2 98%   BMI 31.97 kg/m    Subjective:    Patient ID: John Huang, male    DOB: 13-Jul-1975, 45 y.o.   MRN: [id number]  HPI: John Huang is a 45 y.o. male  Chief Complaint  Patient presents with   Back Pain    4 week f/up   Has an appointment to see the colorectal surgeon for a 2nd opinion on 8/4. Has a follow up with his previous surgeon next Friday.  He went to the ER for fasciculations. He has noticed that he has had pain in his legs and has had paresthesias in his legs. He notes that he was having muscle spasms in his legs. He went to the ER and feels like not much was done. He notes that he is very anxious about it. He notes that he has been feeling like he has been getting worse. He notes that he continues with night sweats.  He has been having epigastric pain which he notes that it feels like acidic and like a bubble in the center in his chest. The only thing that has been helping has been deep belly breaths.  He notes that his back has been doing much better. He notes that he only needed a muscle relaxer 1x last week. He has been doing PT at home which seems to be helping. He does not feel like his injections helped his back- if anything they made it worse.   Relevant past medical, surgical, family and social history reviewed and updated as indicated. Interim medical history since our last visit reviewed. Allergies and medications reviewed and updated.  Review of Systems  Constitutional:  Positive for diaphoresis. Negative for activity change, appetite change, chills, fatigue, fever and unexpected weight change.  Respiratory: Negative.    Cardiovascular: Negative.   Gastrointestinal: Negative.   Musculoskeletal:  Positive for arthralgias, back pain and myalgias. Negative for gait problem, joint swelling, neck pain and neck stiffness.  Skin:  Negative.   Neurological:  Positive for tremors and weakness. Negative for dizziness, seizures, syncope, facial asymmetry, speech difficulty, light-headedness, numbness and headaches.  Psychiatric/Behavioral: Negative.     Per HPI unless specifically indicated above     Objective:    BP 129/85 (BP Location: Left Arm, Cuff Size: Normal)   Pulse 69   Temp 98.3 F (36.8 C) (Oral)   Wt 239 lb (108.4 kg)   SpO2 98%   BMI 31.97 kg/m   Wt Readings from Last 3 Encounters:  12/05/20 239 lb (108.4 kg)  11/07/20 237 lb 3.2 oz (107.6 kg)  09/26/20 237 lb 3.2 oz (107.6 kg)    Physical Exam Vitals and nursing note reviewed.  Constitutional:      General: He is not in acute distress.    Appearance: Normal appearance. He is not ill-appearing, toxic-appearing or diaphoretic.  HENT:     Head: Normocephalic and atraumatic.     Right Ear: External ear normal.     Left Ear: External ear normal.     Nose: Nose normal.     Mouth/Throat:     Mouth: Mucous membranes are moist.     Pharynx: Oropharynx is clear.  Eyes:     General: No scleral icterus.       Right eye: No discharge.  Left eye: No discharge.     Extraocular Movements: Extraocular movements intact.     Conjunctiva/sclera: Conjunctivae normal.     Pupils: Pupils are equal, round, and reactive to light.  Cardiovascular:     Rate and Rhythm: Normal rate and regular rhythm.     Pulses: Normal pulses.     Heart sounds: Normal heart sounds. No murmur heard.   No friction rub. No gallop.  Pulmonary:     Effort: Pulmonary effort is normal. No respiratory distress.     Breath sounds: Normal breath sounds. No stridor. No wheezing, rhonchi or rales.  Chest:     Chest wall: No tenderness.  Musculoskeletal:        General: Normal range of motion.     Cervical back: Normal range of motion and neck supple.  Skin:    General: Skin is warm and dry.     Capillary Refill: Capillary refill takes less than 2 seconds.     Coloration:  Skin is not jaundiced or pale.     Findings: No bruising, erythema, lesion or rash.  Neurological:     General: No focal deficit present.     Mental Status: He is alert and oriented to person, place, and time. Mental status is at baseline.  Psychiatric:        Mood and Affect: Mood normal.        Behavior: Behavior normal.        Thought Content: Thought content normal.        Judgment: Judgment normal.    Results for orders placed or performed in visit on 09/26/20  Comprehensive metabolic panel  Result Value Ref Range   Glucose 84 65 - 99 mg/dL   BUN 14 6 - 24 mg/dL   Creatinine, Ser 1.10 0.76 - 1.27 mg/dL   eGFR 84 >59 mL/min/1.73   BUN/Creatinine Ratio 13 9 - 20   Sodium 141 134 - 144 mmol/L   Potassium 4.5 3.5 - 5.2 mmol/L   Chloride 105 96 - 106 mmol/L   CO2 21 20 - 29 mmol/L   Calcium 9.6 8.7 - 10.2 mg/dL   Total Protein 7.9 6.0 - 8.5 g/dL   Albumin 4.7 4.0 - 5.0 g/dL   Globulin, Total 3.2 1.5 - 4.5 g/dL   Albumin/Globulin Ratio 1.5 1.2 - 2.2   Bilirubin Total 0.2 0.0 - 1.2 mg/dL   Alkaline Phosphatase 68 44 - 121 IU/L   AST 23 0 - 40 IU/L   ALT 34 0 - 44 IU/L  CBC with Differential/Platelet  Result Value Ref Range   WBC 4.4 3.4 - 10.8 x10E3/uL   RBC 5.13 4.14 - 5.80 x10E6/uL   Hemoglobin 14.7 13.0 - 17.7 g/dL   Hematocrit 45.1 37.5 - 51.0 %   MCV 88 79 - 97 fL   MCH 28.7 26.6 - 33.0 pg   MCHC 32.6 31.5 - 35.7 g/dL   RDW 15.1 11.6 - 15.4 %   Platelets 242 150 - 450 x10E3/uL   Neutrophils 41 Not Estab. %   Lymphs 48 Not Estab. %   Monocytes 7 Not Estab. %   Eos 3 Not Estab. %   Basos 1 Not Estab. %   Neutrophils Absolute 1.8 1.4 - 7.0 x10E3/uL   Lymphocytes Absolute 2.1 0.7 - 3.1 x10E3/uL   Monocytes Absolute 0.3 0.1 - 0.9 x10E3/uL   EOS (ABSOLUTE) 0.1 0.0 - 0.4 x10E3/uL   Basophils Absolute 0.0 0.0 - 0.2 x10E3/uL   Immature Granulocytes 0  Not Estab. %   Immature Grans (Abs) 0.0 0.0 - 0.1 x10E3/uL  Lipid Panel w/o Chol/HDL Ratio  Result Value Ref Range    Cholesterol, Total 173 100 - 199 mg/dL   Triglycerides 101 0 - 149 mg/dL   HDL 36 (L) >39 mg/dL   VLDL Cholesterol Cal 19 5 - 40 mg/dL   LDL Chol Calc (NIH) 118 (H) 0 - 99 mg/dL  PSA  Result Value Ref Range   Prostate Specific Ag, Serum 0.9 0.0 - 4.0 ng/mL  TSH  Result Value Ref Range   TSH 1.180 0.450 - 4.500 uIU/mL  Hepatitis C Antibody  Result Value Ref Range   Hep C Virus Ab <0.1 0.0 - 0.9 s/co ratio  Urinalysis, Routine w reflex microscopic  Result Value Ref Range   Specific Gravity, UA 1.015 1.005 - 1.030   pH, UA 7.0 5.0 - 7.5   Color, UA Yellow Yellow   Appearance Ur Clear Clear   Leukocytes,UA Negative Negative   Protein,UA Negative Negative/Trace   Glucose, UA Negative Negative   Ketones, UA Negative Negative   RBC, UA Negative Negative   Bilirubin, UA Negative Negative   Urobilinogen, Ur 0.2 0.2 - 1.0 mg/dL   Nitrite, UA Negative Negative  Microalbumin, Urine Waived (STAT)  Result Value Ref Range   Microalb, Ur Waived 10 0 - 19 mg/L   Creatinine, Urine Waived 200 10 - 300 mg/dL   Microalb/Creat Ratio <30 <30 mg/g      Assessment & Plan:   Problem List Items Addressed This Visit       Musculoskeletal and Integument   Bulging lumbar disc - Primary    Pain improving slightly. Will start gabapentin and get him into neurology. Call with any concerns. Continue to monitor.        Relevant Orders   Ambulatory referral to Neurology   Other Visit Diagnoses     Fasciculations       Will get him into neurology for evalution, would like to consider EMG. Continue to montior. Call with any concerns.    Relevant Orders   Ambulatory referral to Neurology   Paresthesias       Will get him into neurology for evalution, would like to consider EMG. Continue to montior. Call with any concerns.    Relevant Orders   Ambulatory referral to Neurology   Perirectal fistula       Await 2nd opinion with colorectal surgeon. Continue to monitor. Call with any concerns.     Epigastric pain       Continue PPI. Conitnue to monitor. Call with any concerns.         Follow up plan: Return 2-3 months.   >30 minutes spent with patient today.

## 2020-12-06 ENCOUNTER — Encounter: Payer: Self-pay | Admitting: Family Medicine

## 2020-12-08 NOTE — Assessment & Plan Note (Signed)
Pain improving slightly. Will start gabapentin and get him into neurology. Call with any concerns. Continue to monitor.

## 2020-12-10 ENCOUNTER — Encounter: Payer: Self-pay | Admitting: Neurology

## 2020-12-11 DIAGNOSIS — K6289 Other specified diseases of anus and rectum: Secondary | ICD-10-CM | POA: Diagnosis not present

## 2020-12-11 DIAGNOSIS — Z9889 Other specified postprocedural states: Secondary | ICD-10-CM | POA: Diagnosis not present

## 2020-12-19 DIAGNOSIS — K604 Rectal fistula: Secondary | ICD-10-CM | POA: Diagnosis not present

## 2020-12-23 DIAGNOSIS — K6289 Other specified diseases of anus and rectum: Secondary | ICD-10-CM | POA: Diagnosis not present

## 2020-12-23 DIAGNOSIS — K603 Anal fistula: Secondary | ICD-10-CM | POA: Diagnosis not present

## 2020-12-23 DIAGNOSIS — Z6830 Body mass index (BMI) 30.0-30.9, adult: Secondary | ICD-10-CM | POA: Diagnosis not present

## 2020-12-23 DIAGNOSIS — E669 Obesity, unspecified: Secondary | ICD-10-CM | POA: Diagnosis not present

## 2020-12-23 DIAGNOSIS — I10 Essential (primary) hypertension: Secondary | ICD-10-CM | POA: Diagnosis not present

## 2020-12-23 DIAGNOSIS — G4730 Sleep apnea, unspecified: Secondary | ICD-10-CM | POA: Diagnosis not present

## 2020-12-23 DIAGNOSIS — G4733 Obstructive sleep apnea (adult) (pediatric): Secondary | ICD-10-CM | POA: Diagnosis not present

## 2020-12-27 ENCOUNTER — Encounter: Payer: Self-pay | Admitting: Family Medicine

## 2020-12-27 MED ORDER — CYCLOBENZAPRINE HCL 10 MG PO TABS: 10.0000 mg | ORAL_TABLET | Freq: Every day | ORAL | 1 refills | Status: DC

## 2021-01-14 ENCOUNTER — Other Ambulatory Visit: Payer: Self-pay

## 2021-01-20 ENCOUNTER — Encounter: Payer: Self-pay | Admitting: Family Medicine

## 2021-01-21 MED ORDER — GABAPENTIN 300 MG PO CAPS
300.0000 mg | ORAL_CAPSULE | Freq: Three times a day (TID) | ORAL | 3 refills | Status: DC
  Filled 2021-02-17: qty 90, 30d supply, fill #0

## 2021-01-28 ENCOUNTER — Other Ambulatory Visit: Payer: Self-pay | Admitting: Family Medicine

## 2021-01-28 MED ORDER — CLOTRIMAZOLE-BETAMETHASONE 1-0.05 % EX CREA: 1.0000 "application " | TOPICAL_CREAM | Freq: Every day | CUTANEOUS | 0 refills | Status: DC

## 2021-01-28 NOTE — Telephone Encounter (Signed)
Patient requesting refill of Lptrisone cream.  Apply 1 application once daily [id number]. 0 refill.   Last Ov 12/05/20  Upcoming appt. 02/06/21

## 2021-01-31 ENCOUNTER — Encounter: Payer: Self-pay | Admitting: Family Medicine

## 2021-02-06 ENCOUNTER — Encounter: Payer: Self-pay | Admitting: Family Medicine

## 2021-02-06 ENCOUNTER — Other Ambulatory Visit: Payer: Self-pay

## 2021-02-06 ENCOUNTER — Ambulatory Visit: Payer: 59 | Admitting: Family Medicine

## 2021-02-06 VITALS — BP 134/88 | HR 66 | Temp 98.3°F | Resp 16 | Wt 242.4 lb

## 2021-02-06 DIAGNOSIS — H539 Unspecified visual disturbance: Secondary | ICD-10-CM

## 2021-02-06 DIAGNOSIS — Z23 Encounter for immunization: Secondary | ICD-10-CM | POA: Diagnosis not present

## 2021-02-06 DIAGNOSIS — K219 Gastro-esophageal reflux disease without esophagitis: Secondary | ICD-10-CM | POA: Diagnosis not present

## 2021-02-06 DIAGNOSIS — F419 Anxiety disorder, unspecified: Secondary | ICD-10-CM | POA: Diagnosis not present

## 2021-02-06 DIAGNOSIS — I10 Essential (primary) hypertension: Secondary | ICD-10-CM | POA: Diagnosis not present

## 2021-02-06 DIAGNOSIS — R253 Fasciculation: Secondary | ICD-10-CM | POA: Diagnosis not present

## 2021-02-06 DIAGNOSIS — L501 Idiopathic urticaria: Secondary | ICD-10-CM | POA: Diagnosis not present

## 2021-02-06 MED ORDER — OMEPRAZOLE 40 MG PO CPDR: 40.0000 mg | DELAYED_RELEASE_CAPSULE | Freq: Every day | ORAL | 1 refills | Status: DC

## 2021-02-06 MED ORDER — AMLODIPINE BESYLATE 5 MG PO TABS: ORAL_TABLET | Freq: Every day | ORAL | 1 refills | Status: DC

## 2021-02-06 MED ORDER — CITALOPRAM HYDROBROMIDE 40 MG PO TABS: ORAL_TABLET | Freq: Every day | ORAL | 1 refills | Status: DC

## 2021-02-06 NOTE — Progress Notes (Signed)
BP 134/88 (BP Location: Left Arm, Patient Position: Sitting, Cuff Size: Large)   Pulse 66   Temp 98.3 F (36.8 C) (Oral)   Resp 16   Wt 242 lb 6.4 oz (110 kg)   SpO2 98%   BMI 32.42 kg/m    Subjective:    Patient ID: John Huang, male    DOB: 07-24-1975, 45 y.o.   MRN: [id number]  HPI: John Huang is a 45 y.o. male  Chief Complaint  Patient presents with   Hypertension   Still having night sweats and fasiculations. Sleeping better with the meds. Seeing neurology next week.  Saw the Dr who did the surgery- has not been healing as well as they expected. He is still having pain about 5-6/10 with BM. He notes that if he pushes on his L buttock he is able to evacuate more easily  Has been having some jumping movements with his mouth when he is talking. He has no control over this and doesn't know why it's happening. It seems like it is happening more when he is under stress.   His itching has been doing better. He has had some blisters inside his nose.   His vision has been getting worse. He is not nauseous, not feeling as bad as he did before his surgery.  HYPERTENSION Hypertension status: controlled  Satisfied with current treatment? yes Duration of hypertension: chronic BP monitoring frequency:  a few times a week BP medication side effects:  no Medication compliance: excellent compliance Previous BP meds: amlodipine Aspirin: no Recurrent headaches: no Visual changes: no Palpitations: no Dyspnea: no Chest pain: no Lower extremity edema: no Dizzy/lightheaded: no  Relevant past medical, surgical, family and social history reviewed and updated as indicated. Interim medical history since our last visit reviewed. Allergies and medications reviewed and updated.  Review of Systems  Constitutional: Negative.   Respiratory: Negative.    Cardiovascular: Negative.   Gastrointestinal: Negative.   Musculoskeletal:  Positive for arthralgias, back pain and myalgias.  Negative for gait problem, joint swelling, neck pain and neck stiffness.  Skin: Negative.   Psychiatric/Behavioral: Negative.     Per HPI unless specifically indicated above     Objective:    BP 134/88 (BP Location: Left Arm, Patient Position: Sitting, Cuff Size: Large)   Pulse 66   Temp 98.3 F (36.8 C) (Oral)   Resp 16   Wt 242 lb 6.4 oz (110 kg)   SpO2 98%   BMI 32.42 kg/m   Wt Readings from Last 3 Encounters:  02/10/21 243 lb (110.2 kg)  02/06/21 242 lb 6.4 oz (110 kg)  12/05/20 239 lb (108.4 kg)    Physical Exam Vitals and nursing note reviewed.  Constitutional:      General: He is not in acute distress.    Appearance: Normal appearance. He is not ill-appearing, toxic-appearing or diaphoretic.  HENT:     Head: Normocephalic and atraumatic.     Right Ear: External ear normal.     Left Ear: External ear normal.     Nose: Nose normal.     Mouth/Throat:     Mouth: Mucous membranes are moist.     Pharynx: Oropharynx is clear.  Eyes:     General: No scleral icterus.       Right eye: No discharge.        Left eye: No discharge.     Extraocular Movements: Extraocular movements intact.     Conjunctiva/sclera: Conjunctivae normal.  Pupils: Pupils are equal, round, and reactive to light.  Cardiovascular:     Rate and Rhythm: Normal rate and regular rhythm.     Pulses: Normal pulses.     Heart sounds: Normal heart sounds. No murmur heard.   No friction rub. No gallop.  Pulmonary:     Effort: Pulmonary effort is normal. No respiratory distress.     Breath sounds: Normal breath sounds. No stridor. No wheezing, rhonchi or rales.  Chest:     Chest wall: No tenderness.  Musculoskeletal:        General: Normal range of motion.     Cervical back: Normal range of motion and neck supple.  Skin:    General: Skin is warm and dry.     Capillary Refill: Capillary refill takes less than 2 seconds.     Coloration: Skin is not jaundiced or pale.     Findings: No bruising,  erythema, lesion or rash.  Neurological:     General: No focal deficit present.     Mental Status: He is alert and oriented to person, place, and time. Mental status is at baseline.  Psychiatric:        Mood and Affect: Mood normal.        Behavior: Behavior normal.        Thought Content: Thought content normal.        Judgment: Judgment normal.    Results for orders placed or performed in visit on 26/94/85  Basic metabolic panel  Result Value Ref Range   Glucose 98 65 - 99 mg/dL   BUN 10 6 - 24 mg/dL   Creatinine, Ser 1.22 0.76 - 1.27 mg/dL   eGFR 75 >59 mL/min/1.73   BUN/Creatinine Ratio 8 (L) 9 - 20   Sodium 140 134 - 144 mmol/L   Potassium 4.5 3.5 - 5.2 mmol/L   Chloride 104 96 - 106 mmol/L   CO2 21 20 - 29 mmol/L   Calcium 9.4 8.7 - 10.2 mg/dL  CBC with Differential/Platelet  Result Value Ref Range   WBC 4.8 3.4 - 10.8 x10E3/uL   RBC 5.03 4.14 - 5.80 x10E6/uL   Hemoglobin 14.2 13.0 - 17.7 g/dL   Hematocrit 44.1 37.5 - 51.0 %   MCV 88 79 - 97 fL   MCH 28.2 26.6 - 33.0 pg   MCHC 32.2 31.5 - 35.7 g/dL   RDW 14.5 11.6 - 15.4 %   Platelets 277 150 - 450 x10E3/uL   Neutrophils 55 Not Estab. %   Lymphs 35 Not Estab. %   Monocytes 7 Not Estab. %   Eos 2 Not Estab. %   Basos 1 Not Estab. %   Neutrophils Absolute 2.7 1.4 - 7.0 x10E3/uL   Lymphocytes Absolute 1.7 0.7 - 3.1 x10E3/uL   Monocytes Absolute 0.3 0.1 - 0.9 x10E3/uL   EOS (ABSOLUTE) 0.1 0.0 - 0.4 x10E3/uL   Basophils Absolute 0.0 0.0 - 0.2 x10E3/uL   Immature Granulocytes 0 Not Estab. %   Immature Grans (Abs) 0.0 0.0 - 0.1 x10E3/uL      Assessment & Plan:   Problem List Items Addressed This Visit       Cardiovascular and Mediastinum   Hypertension    Under good control on current regimen. Continue current regimen. Continue to monitor. Call with any concerns. Refills given. Labs drawn today.       Relevant Medications   amLODipine (NORVASC) 5 MG tablet   Other Relevant Orders   Basic metabolic panel  (  Completed)     Digestive   GERD (gastroesophageal reflux disease)    Under good control on current regimen. Continue current regimen. Continue to monitor. Call with any concerns. Refills given. Labs drawn today.      Relevant Medications   omeprazole (PRILOSEC) 40 MG capsule   Other Relevant Orders   CBC with Differential/Platelet (Completed)     Musculoskeletal and Integument   Idiopathic urticaria    Doing better. Continue to monitor. Call with any concerns.         Other   Anxiety    Under good control on current regimen. Continue current regimen. Continue to monitor. Call with any concerns. Refills given. Labs drawn today.      Relevant Medications   citalopram (CELEXA) 40 MG tablet   Other Visit Diagnoses     Need for influenza vaccination    -  Primary   Relevant Orders   Flu Vaccine QUAD 6+ mos PF IM (Fluarix Quad PF) (Completed)   Fasciculations       Due to see neurology shortly. Advised him to keep that appointment. Callw ith any concerns.    Visual changes       Due to see neurology and opthalmology shortly. Advised him to keep that appointment. Call with any concerns.         Follow up plan: Return in about 3 months (around 05/08/2021).

## 2021-02-07 LAB — CBC WITH DIFFERENTIAL/PLATELET
Basophils Absolute: 0 10*3/uL (ref 0.0–0.2)
Basos: 1 %
EOS (ABSOLUTE): 0.1 10*3/uL (ref 0.0–0.4)
Eos: 2 %
Hematocrit: 44.1 % (ref 37.5–51.0)
Hemoglobin: 14.2 g/dL (ref 13.0–17.7)
Immature Grans (Abs): 0 10*3/uL (ref 0.0–0.1)
Immature Granulocytes: 0 %
Lymphocytes Absolute: 1.7 10*3/uL (ref 0.7–3.1)
Lymphs: 35 %
MCH: 28.2 pg (ref 26.6–33.0)
MCHC: 32.2 g/dL (ref 31.5–35.7)
MCV: 88 fL (ref 79–97)
Monocytes Absolute: 0.3 10*3/uL (ref 0.1–0.9)
Monocytes: 7 %
Neutrophils Absolute: 2.7 10*3/uL (ref 1.4–7.0)
Neutrophils: 55 %
Platelets: 277 10*3/uL (ref 150–450)
RBC: 5.03 x10E6/uL (ref 4.14–5.80)
RDW: 14.5 % (ref 11.6–15.4)
WBC: 4.8 10*3/uL (ref 3.4–10.8)

## 2021-02-07 LAB — BASIC METABOLIC PANEL
BUN/Creatinine Ratio: 8 — ABNORMAL LOW (ref 9–20)
BUN: 10 mg/dL (ref 6–24)
CO2: 21 mmol/L (ref 20–29)
Calcium: 9.4 mg/dL (ref 8.7–10.2)
Chloride: 104 mmol/L (ref 96–106)
Creatinine, Ser: 1.22 mg/dL (ref 0.76–1.27)
Glucose: 98 mg/dL (ref 65–99)
Potassium: 4.5 mmol/L (ref 3.5–5.2)
Sodium: 140 mmol/L (ref 134–144)
eGFR: 75 mL/min/{1.73_m2} (ref >59)

## 2021-02-10 ENCOUNTER — Ambulatory Visit (INDEPENDENT_AMBULATORY_CARE_PROVIDER_SITE_OTHER): Payer: 59 | Admitting: Neurology

## 2021-02-10 ENCOUNTER — Encounter: Payer: Self-pay | Admitting: Neurology

## 2021-02-10 ENCOUNTER — Other Ambulatory Visit: Payer: Self-pay

## 2021-02-10 VITALS — BP 147/96 | HR 77 | Ht 74.0 in | Wt 243.0 lb

## 2021-02-10 DIAGNOSIS — R253 Fasciculation: Secondary | ICD-10-CM

## 2021-02-10 DIAGNOSIS — H539 Unspecified visual disturbance: Secondary | ICD-10-CM | POA: Diagnosis not present

## 2021-02-10 NOTE — Progress Notes (Signed)
[facility] Clinic Note - Initial Visit   Date: 02/10/21  John Huang MRN: [id number] DOB: September 03, 1975   Dear Dr. Wynetta Emery:  Thank you for your kind referral of John Huang for consultation of leg twitching. Although his history is well known to you, please allow [place] to reiterate it for the purpose of our medical record. The patient was accompanied to the clinic by wife who also provides collateral information.     History of Present Illness: John Huang is a 45 y.o. right-handed male referred for evaluation of muscle fasciculations.  He was planning to have colorectal surgery on June 13th and underwent spinal block and had botox for rectal tear.  His surgery was cancelled, however, the following day, he developed bilateral leg fasciculations.  He has noticed that stress makes it worse. He takes gabapentin 600mg  three times daily which makes it less noticeable. He has leg cramps.  He denies weakness in the arms and legs. No numbness/tingling. He developed low back pain around the same time.    He also complains of sporadic uncontrolled movement of his jaw, like a tic.  Wife also states that he is having problems with his vision, described as fluctuating changes day-to-day.  There is no specific loss of vision.   He works in Nurse, learning disability.   Out-side paper records, electronic medical record, and images have been reviewed where available and summarized as:  MRI lumbar spine wo contrast 02/29/2020: Transitional lumbosacral anatomy. For the purposes of this dictation  and to stay consistent with the prior radiograph dictation, there is lumbarization of S1 and pseudoarticulation of the left transverse process of S1 with the sacrum. The the inferior-most fully formed intervertebral disc is labeled S1-S2.   1. At L5-S1 there is a broad-based disc bulge with mild canal stenosis and moderate bilateral subarticular recess stenosis with disc contacting  bilateral descending L5 nerve roots.    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    History of septic arthritis 2000   L knee   Hypertension    controlled   PONV (postoperative nausea and vomiting)    Seasonal allergies    Sleep apnea    CPAP in past then throat surgery, no CPAP   Vertigo    Hx of    Past Surgical History:  Procedure Laterality Date   ANTERIOR CRUCIATE LIGAMENT REPAIR     COLONOSCOPY WITH PROPOFOL N/A 08/16/2020   Procedure: COLONOSCOPY WITH PROPOFOL;  Surgeon: Lucilla Lame, MD;  Location: [facility];  Service: Endoscopy;  Laterality: N/A;   ELBOW SURGERY Left 06/20/2020   ESOPHAGOGASTRODUODENOSCOPY (EGD) WITH PROPOFOL N/A 08/16/2020   Procedure: ESOPHAGOGASTRODUODENOSCOPY (EGD) WITH PROPOFOL;  Surgeon: Lucilla Lame, MD;  Location: [facility];  Service: Endoscopy;  Laterality: N/A;   FOOT SURGERY  2020   MEDIAL COLLATERAL LIGAMENT AND LATERAL COLLATERAL LIGAMENT REPAIR, KNEE  SHOULDER SURGERY     dislocation and tac left in there, so had to have surgery to removed the tac   TONSILLECTOMY  2020   WISDOM TOOTH EXTRACTION       Medications:  Outpatient Encounter Medications as of 02/10/2021  Medication Sig   amLODipine (NORVASC) 5 MG tablet TAKE 1 TABLET BY MOUTH DAILY.   citalopram (CELEXA) 40 MG tablet TAKE 1 TABLET BY MOUTH DAILY.   clotrimazole-betamethasone (LOTRISONE) cream Apply 1 application topically daily.   cyclobenzaprine (FLEXERIL) 10  MG tablet Take 1 tablet (10 mg total) by mouth at bedtime.   fexofenadine (ALLEGRA) 180 MG tablet Take 1-2 tablets (180-360 mg total) by mouth daily.   finasteride (PROPECIA) 1 MG tablet Take 1 mg by mouth daily. am   gabapentin (NEURONTIN) 300 MG capsule Take 1 capsule (300 mg total) by mouth 3 (three) times daily.   Multiple Vitamin (MULTI-VITAMINS) TABS Take by mouth.   naproxen (NAPROSYN) 500 MG tablet Take 1 tablet (500 mg total) by mouth 2 (two) times daily with a meal.   omeprazole (PRILOSEC) 40 MG capsule Take 1 capsule (40 mg total) by mouth daily.   traZODone (DESYREL) 50 MG tablet Take 0.5-1 tablets (25-50 mg total) by mouth at bedtime as needed for sleep.   [DISCONTINUED] ondansetron (ZOFRAN ODT) 4 MG disintegrating tablet Take 1 tablet (4 mg total) by mouth every 8 (eight) hours as needed for nausea or vomiting. (Patient not taking: Reported on 02/10/2021)   No facility-administered encounter medications on file as of 02/10/2021.    Allergies:  Allergies  Allergen Reactions   Cat Hair Extract Hives and Other (See Comments)    Wheezing, eyes itch Wheezing, eyes itch Wheezing, eyes itch   Other Hives and Other (See Comments)    Wheezing, eyes itch   Alpha-Gal Hives   Galactose Other (See Comments)    Flu like symptoms after consuming mammalian products Flu like symptoms after consuming mammalian products Flu like symptoms after consuming mammalian products Flu like symptoms after consuming mammalian products    Family History: Family History  Problem Relation Age of Onset   Stroke Mother    Hypertension Mother    Diabetes Mother    Cancer Father        Throat   Cancer Sister        Eye/Brain   Hypertension Brother    Irritable bowel syndrome Brother     Social History: Social History   Tobacco Use   Smoking status: Never   Smokeless tobacco: Never  Vaping Use   Vaping Use: Never used  Substance Use Topics   Alcohol use: Not Currently   Drug use: Never    Social History   Social History Narrative   Right Handed    Lives in one story home     Vital Signs:  BP (!) 147/96   Pulse 77   Ht 6\' 2"  (1.88 m)   Wt 243 lb (110.2 kg)   SpO2 99%   BMI 31.20 kg/m     Neurological Exam: MENTAL STATUS including orientation to time, place, person, recent and remote memory, attention span and concentration, language, and fund of knowledge is normal.  Speech is not dysarthric.  CRANIAL NERVES: II:  No visual field defects.    III-IV-VI: Pupils equal round and reactive to light.  Normal conjugate, extra-ocular eye movements in all directions of gaze.  No nystagmus.  No ptosis.   V:  Normal facial sensation.    VII:  Normal facial symmetry and movements.   VIII:  Normal hearing and vestibular function.   IX-X:  Normal palatal movement.   XI:  Normal shoulder shrug and head rotation.   XII:  Normal tongue strength and range of motion, no deviation or fasciculation.  MOTOR: Rare fasciculations over the calf muscles.  No atrophy or abnormal movements.  No pronator drift.   Upper Extremity:  Right  Left  Deltoid  5/5   5/5   Biceps  5/5   5/5   Triceps  5/5   5/5   Infraspinatus 5/5  5/5  Medial pectoralis 5/5  5/5  Wrist extensors  5/5   5/5   Wrist flexors  5/5   5/5   Finger extensors  5/5   5/5   Finger flexors  5/5   5/5   Dorsal interossei  5/5   5/5   Abductor pollicis  5/5   5/5   Tone (Ashworth scale)  0  0   Lower Extremity:  Right  Left  Hip flexors  5/5   5/5   Hip extensors  5/5   5/5   Adductor 5/5  5/5  Abductor 5/5  5/5  Knee flexors  5/5   5/5   Knee extensors  5/5   5/5   Dorsiflexors  5/5   5/5   Plantarflexors  5/5   5/5   Toe extensors  5/5   5/5   Toe flexors  5/5   5/5   Tone (Ashworth scale)  0  0   MSRs:  Right        Left                  brachioradialis 2+  2+  biceps 2+  2+  triceps 2+  2+  patellar 2+  2+  ankle jerk 2+  2+  Hoffman no  no  plantar response down  down   SENSORY:  Normal and  symmetric perception of light touch, pinprick, vibration, and proprioception.  Romberg's sign absent.   COORDINATION/GAIT: Normal finger-to- nose-finger.  Intact rapid alternating movements bilaterally.  Gait narrow based and stable. Tandem and stressed gait intact.    IMPRESSION: Fasciculations, most likely benign.  No evidence of associated weakness.    - NCS/EMG of the legs to better characterize his symptoms.   - Continue gabapentin 600mg  TID prescribed by his PCP  2.  Vision changes and abnormal jaw movements (none observed)  - MRI brain wwo contrast  - Patient informed that he may need to see movement disorder specialist for his jaw movements  Further recommendations pending results.    Thank you for allowing me to participate in patient's care.  If I can answer any additional questions, I would be pleased to do so.    Sincerely,    Tegh Franek K. Posey Pronto, DO

## 2021-02-10 NOTE — Patient Instructions (Signed)
MRI brain without contrast  Nerve testing of the legs  ELECTROMYOGRAM AND NERVE CONDUCTION STUDIES (EMG/NCS) INSTRUCTIONS  How to Prepare The neurologist conducting the EMG will need to know if you have certain medical conditions. Tell the neurologist and other EMG lab personnel if you: Have a pacemaker or any other electrical medical device Take blood-thinning medications Have hemophilia, a blood-clotting disorder that causes prolonged bleeding Bathing Take a shower or bath shortly before your exam in order to remove oils from your skin. Don't apply lotions or creams before the exam.  What to Expect You'll likely be asked to change into a hospital gown for the procedure and lie down on an examination table. The following explanations can help you understand what will happen during the exam.  Electrodes. The neurologist or a technician places surface electrodes at various locations on your skin depending on where you're experiencing symptoms. Or the neurologist may insert needle electrodes at different sites depending on your symptoms.  Sensations. The electrodes will at times transmit a tiny electrical current that you may feel as a twinge or spasm. The needle electrode may cause discomfort or pain that usually ends shortly after the needle is removed. If you are concerned about discomfort or pain, you may want to talk to the neurologist about taking a short break during the exam.  Instructions. During the needle EMG, the neurologist will assess whether there is any spontaneous electrical activity when the muscle is at rest - activity that isn't present in healthy muscle tissue - and the degree of activity when you slightly contract the muscle.  He or she will give you instructions on resting and contracting a muscle at appropriate times. Depending on what muscles and nerves the neurologist is examining, he or she may ask you to change positions during the exam.  After your EMG You may  experience some temporary, minor bruising where the needle electrode was inserted into your muscle. This bruising should fade within several days. If it persists, contact your primary care doctor.

## 2021-02-14 ENCOUNTER — Encounter: Payer: Self-pay | Admitting: Family Medicine

## 2021-02-14 NOTE — Assessment & Plan Note (Signed)
Under good control on current regimen. Continue current regimen. Continue to monitor. Call with any concerns. Refills given. Labs drawn today.   

## 2021-02-14 NOTE — Assessment & Plan Note (Signed)
Doing better. Continue to monitor. Call with any concerns.

## 2021-02-17 ENCOUNTER — Other Ambulatory Visit: Payer: Self-pay

## 2021-02-27 ENCOUNTER — Other Ambulatory Visit: Payer: 59

## 2021-03-02 ENCOUNTER — Ambulatory Visit
Admission: RE | Admit: 2021-03-02 | Discharge: 2021-03-02 | Disposition: A | Discharge status: Home / Self Care | Payer: 59 | Source: Ambulatory Visit | Attending: Neurology | Admitting: Neurology

## 2021-03-02 ENCOUNTER — Other Ambulatory Visit: Payer: Self-pay

## 2021-03-02 DIAGNOSIS — H539 Unspecified visual disturbance: Secondary | ICD-10-CM | POA: Diagnosis not present

## 2021-03-02 DIAGNOSIS — R253 Fasciculation: Secondary | ICD-10-CM

## 2021-03-02 IMAGING — MR MR HEAD WO/W CM
12 series · 48 of 48 positions shown · IV contrast (20ml multihance)
Comparison: None.

CLINICAL DATA: Vision changes, fasciculations

EXAM:
MRI HEAD WITHOUT AND WITH CONTRAST
TECHNIQUE: Multiplanar, multiecho pulse sequences of the brain and surrounding
structures were obtained without and with intravenous contrast.
CONTRAST:  20mL MULTIHANCE GADOBENATE DIMEGLUMINE 529 MG/ML IV SOLN

[Series 2: T1 · sagittal · 5.0mm · 0.45mm/px · 3 of 27 slices shown]
[im 1/27]
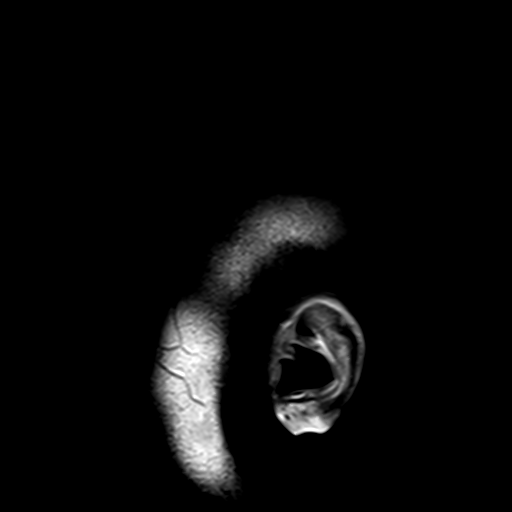
[im 14/27]
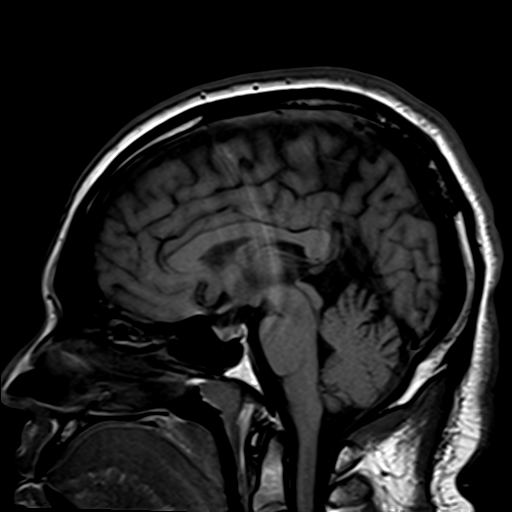
[im 27/27]
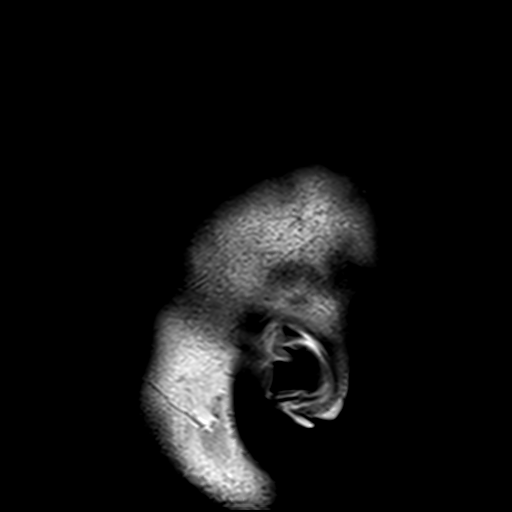

[Series 3: DWI · axial · 3.0mm · 1.80mm/px · z∈[-99,+65]mm · 6 of 112 slices shown (1 of 4)]
[im 1/112]
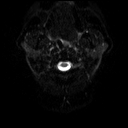
[im 23/112]
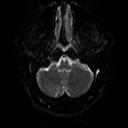
[im 45/112]
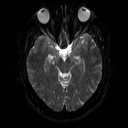
[im 67/112]
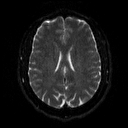
[im 89/112]
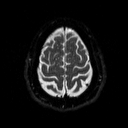
[im 112/112]
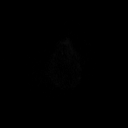

[Series 4: DWI · axial · 3.0mm · 1.80mm/px · z∈[-99,+65]mm · 3 of 54 slices shown (2 of 4)]
[im 1/54]
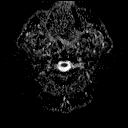
[im 27/54]
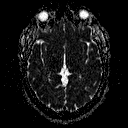
[im 54/54]
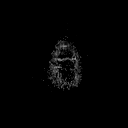

[Series 5: DWI · coronal · 5.0mm · 1.80mm/px · 5 of 81 slices shown (3 of 4)]
[im 1/81]
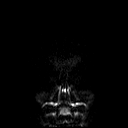
[im 21/81]
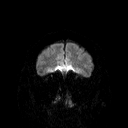
[im 41/81]
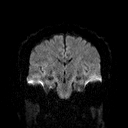
[im 61/81]
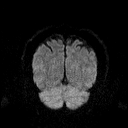
[im 81/81]
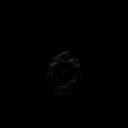

[Series 6: DWI · coronal · 5.0mm · 1.80mm/px · 2 of 41 slices shown (4 of 4)]
[im 1/41]
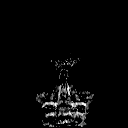
[im 41/41]
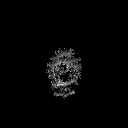

[Series 7: T2 · axial · 5.0mm · 0.60mm/px · 1 of 26 slices shown (1 of 2)]
[im 1/26]
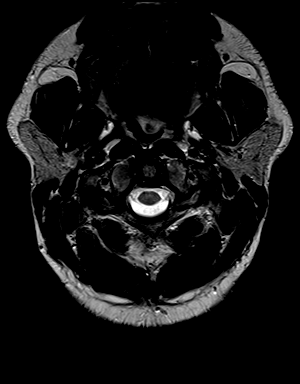

[Series 8: FLAIR · axial · 3.0mm · 0.45mm/px · z∈[-107,+68]mm · 2 of 39 slices shown]
[im 1/39]
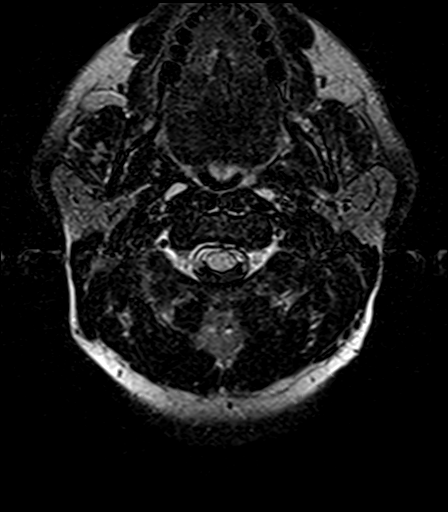
[im 39/39]
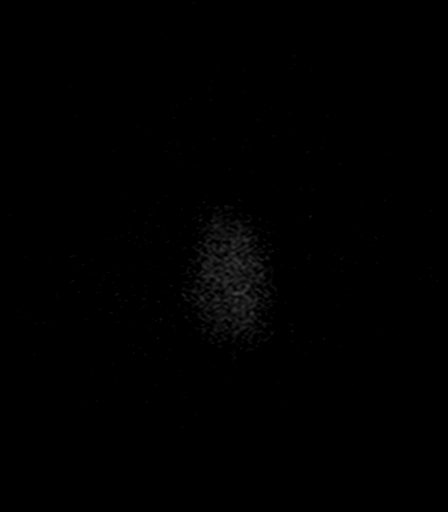

[Series 10: swi_images · axial · 4.0mm · 0.90mm/px · z∈[-95,+60]mm · 2 of 40 slices shown]
[im 1/40]
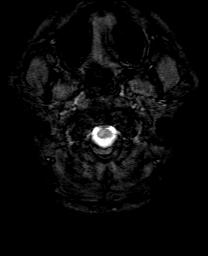
[im 40/40]
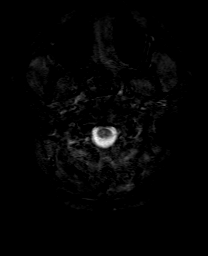

[Series 11: t1_mpr_tra · axial · 1.0mm · 0.81mm/px · z∈[-105,+69]mm · 10 of 176 slices shown]
[im 1/176]
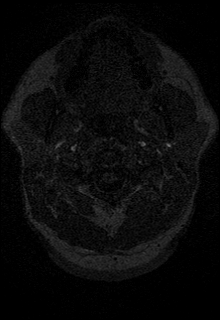
[im 20/176]
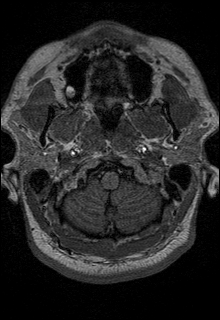
[im 39/176]
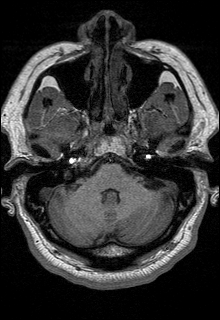
[im 59/176]
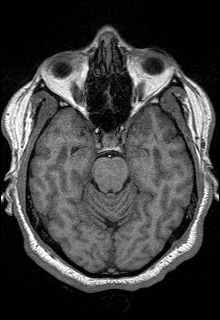
[im 78/176]
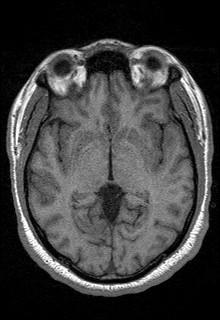
[im 98/176]
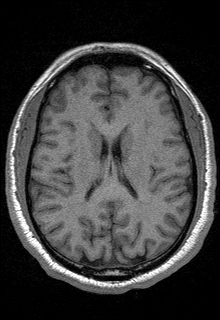
[im 117/176]
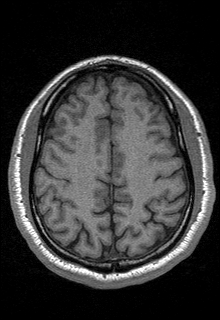
[im 137/176]
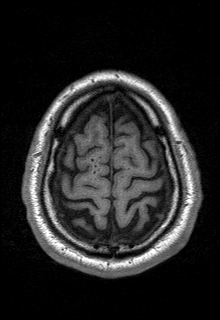
[im 156/176]
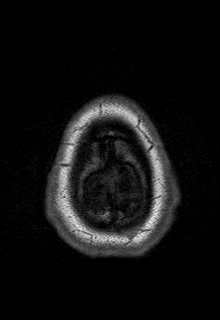
[im 176/176]
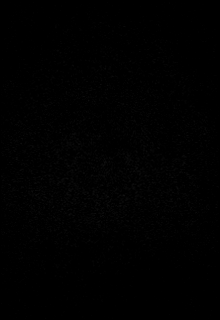

[Series 12: T2 · coronal · 5.0mm · 0.45mm/px · 2 of 33 slices shown (2 of 2)]
[im 1/33]
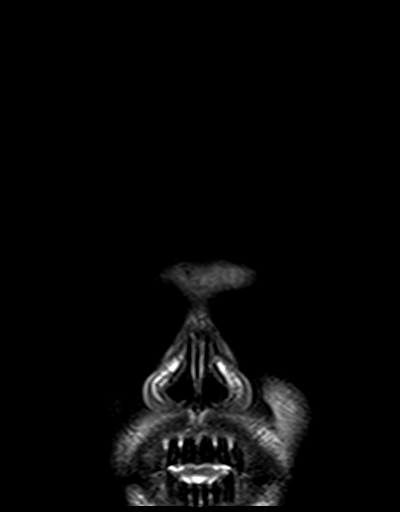
[im 33/33]
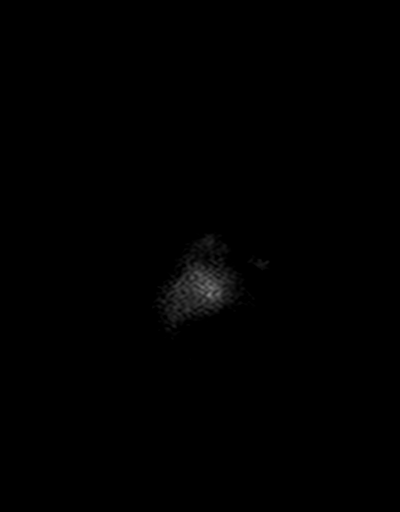

[Series 13: t1_mpr_tra post · axial · 1.0mm · 0.81mm/px · z∈[-105,+69]mm · 10 of 176 slices shown]
[im 1/176]
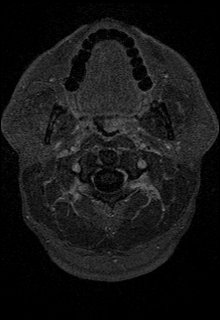
[im 20/176]
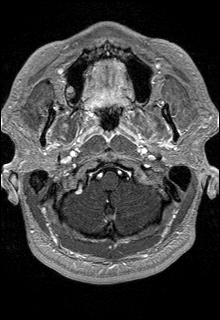
[im 39/176]
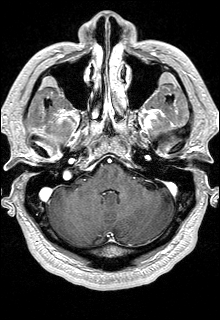
[im 59/176]
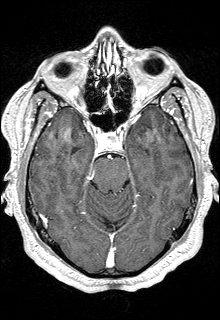
[im 78/176]
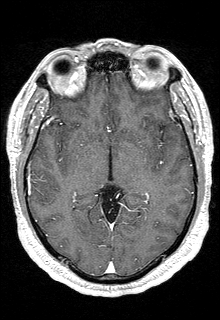
[im 98/176]
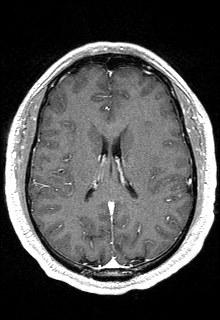
[im 117/176]
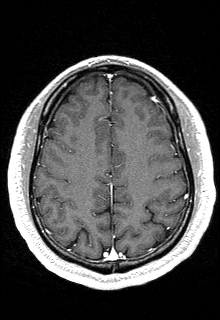
[im 137/176]
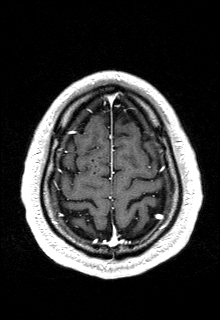
[im 156/176]
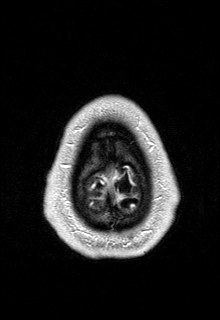
[im 176/176]
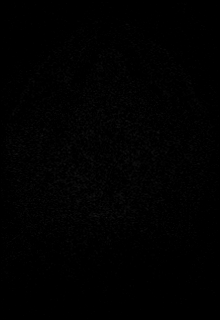

[Series 14: post cor · coronal · 5.0mm · 0.45mm/px · 2 of 33 slices shown]
[im 1/33]
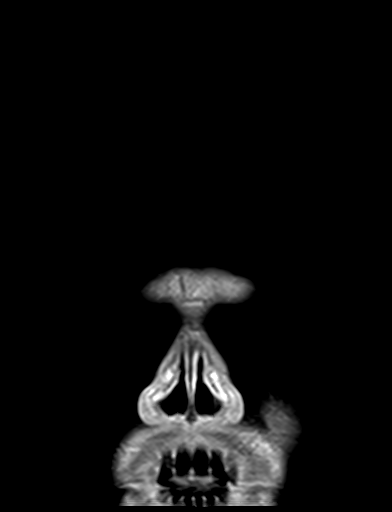
[im 33/33]
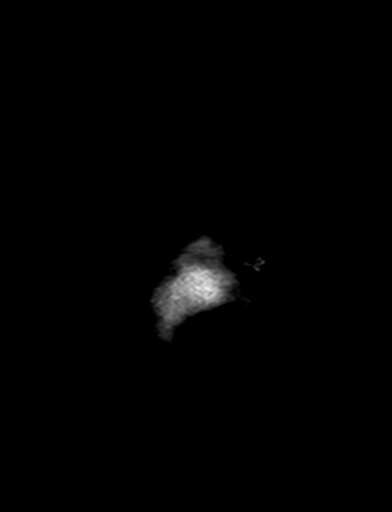

[48 of 48 positions shown; findings below may reference images not displayed]

FINDINGS: Brain: There is no acute infarction or intracranial hemorrhage.
There is no intracranial mass, mass effect, or edema. There is no
hydrocephalus or extra-axial fluid collection. Ventricles and sulci
are normal in size and configuration. No abnormal enhancement.

Vascular: Major vessel flow voids at the skull base are preserved.

Skull and upper cervical spine: Normal marrow signal is preserved.

Sinuses/Orbits: Minor mucosal thickening.  Orbits are unremarkable.

Other: Sella is unremarkable. Patchy mastoid tip fluid opacification
bilaterally.
IMPRESSION: No acute abnormality.  No intracranial mass or abnormal enhancement.

## 2021-03-02 MED ORDER — GADOBENATE DIMEGLUMINE 529 MG/ML IV SOLN
20.0000 mL | Freq: Once | INTRAVENOUS | Status: AC | PRN
  Administered 2021-03-02: 20 mL via INTRAVENOUS

## 2021-03-06 ENCOUNTER — Encounter: Payer: 59 | Admitting: Neurology

## 2021-03-12 DIAGNOSIS — G4733 Obstructive sleep apnea (adult) (pediatric): Secondary | ICD-10-CM | POA: Diagnosis not present

## 2021-03-12 DIAGNOSIS — E669 Obesity, unspecified: Secondary | ICD-10-CM | POA: Diagnosis not present

## 2021-03-12 DIAGNOSIS — G4730 Sleep apnea, unspecified: Secondary | ICD-10-CM | POA: Diagnosis not present

## 2021-03-12 DIAGNOSIS — Z6830 Body mass index (BMI) 30.0-30.9, adult: Secondary | ICD-10-CM | POA: Diagnosis not present

## 2021-03-12 DIAGNOSIS — K604 Rectal fistula: Secondary | ICD-10-CM | POA: Diagnosis not present

## 2021-03-12 DIAGNOSIS — K602 Anal fissure, unspecified: Secondary | ICD-10-CM | POA: Diagnosis not present

## 2021-03-12 DIAGNOSIS — I10 Essential (primary) hypertension: Secondary | ICD-10-CM | POA: Diagnosis not present

## 2021-03-12 DIAGNOSIS — K603 Anal fistula: Secondary | ICD-10-CM | POA: Diagnosis not present

## 2021-04-02 ENCOUNTER — Encounter: Payer: Self-pay | Admitting: Family Medicine

## 2021-04-04 ENCOUNTER — Encounter: Payer: Self-pay | Admitting: Gastroenterology

## 2021-04-08 ENCOUNTER — Ambulatory Visit (INDEPENDENT_AMBULATORY_CARE_PROVIDER_SITE_OTHER): Payer: 59 | Admitting: Neurology

## 2021-04-08 ENCOUNTER — Ambulatory Visit: Payer: 59 | Admitting: Family Medicine

## 2021-04-08 ENCOUNTER — Other Ambulatory Visit: Payer: Self-pay

## 2021-04-08 DIAGNOSIS — R253 Fasciculation: Secondary | ICD-10-CM

## 2021-04-08 DIAGNOSIS — H539 Unspecified visual disturbance: Secondary | ICD-10-CM

## 2021-04-08 NOTE — Procedures (Signed)
[facility]  [place], [address]  [place],  [address] Tel: [phone] Fax:  [phone] Test Date:  04/08/2021  Patient: John Huang DOB: [birth year] Physician: Narda Amber, DO  Sex: Male Height: 6\' 2"  Ref Phys: Narda Amber, DO  ID#: [id number]   Technician:    Patient Complaints: This is a 45 year old man referred for evaluation fasciculations.  NCV & EMG Findings: Electrodiagnostic testing of the right lower extremity and additional studies of the left shows: Bilateral sural and superficial peroneal sensory responses are within normal limits. Bilateral peroneal and tibial motor responses are within normal limits. Bilateral tibial H reflex studies are within normal limits. There is no evidence of active or chronic motor axonal changes affecting any of the tested muscles.  Motor unit configuration and recruitment pattern is within normal limits.  Impression: This is a normal study of the lower extremities.  In particular, there is no evidence of a sensorimotor polyneuropathy or lumbosacral radiculopathy.   ___________________________ Narda Amber, DO    Nerve Conduction Studies Anti Sensory Summary Table   Stim Site NR Peak (ms) Norm Peak (ms) P-T Amp (V) Norm P-T Amp  Left Sup Peroneal Anti Sensory (Ant Lat Mall)  32C  12 cm    2.7 <4.5 12.2 >5  Right Sup Peroneal Anti Sensory (Ant Lat Mall)  32C  12 cm    2.2 <4.5 14.4 >5  Left Sural Anti Sensory (Lat Mall)  32C  Calf    2.6 <4.5 17.0 >5  Right Sural Anti Sensory (Lat Mall)  32C  Calf    3.1 <4.5 17.4 >5   Motor Summary Table   Stim Site NR Onset (ms) Norm Onset (ms) O-P Amp (mV) Norm O-P Amp Site1 Site2 Delta-0 (ms) Dist (cm) Vel (m/s) Norm Vel (m/s)  Left Peroneal Motor (Ext Dig Brev)  32C  Ankle    4.5 <5.5 6.3 >3 B Fib Ankle 8.6 39.0 45 >40  B Fib    13.1  6.3  Poplt B Fib 1.6 9.0 56 >40  Poplt    14.7  6.2         Right Peroneal Motor (Ext Dig Brev)  32C  Ankle    4.5  <5.5 5.2 >3 B Fib Ankle 9.1 41.0 45 >40  B Fib    13.6  4.5  Poplt B Fib 1.9 10.0 53 >40  Poplt    15.5  4.2         Left Tibial Motor (Abd Hall Brev)  32C  Ankle    4.0 <6.0 8.3 >8 Knee Ankle 10.2 43.0 42 >40  Knee    14.2  6.1         Right Tibial Motor (Abd Hall Brev)  32C  Ankle    4.7 <6.0 9.6 >8 Knee Ankle 10.1 43.0 43 >40  Knee    14.8  6.3          H Reflex Studies   NR H-Lat (ms) Lat Norm (ms) L-R H-Lat (ms)  Left Tibial (Gastroc)  32C     36.73 <37 0.14  Right Tibial (Gastroc)  32C     36.87 <37 0.14   EMG   Side Muscle Ins Act Fibs Psw Fasc Number Recrt Dur Dur. Amp Amp. Poly Poly. Comment  Right AntTibialis Nml Nml Nml Nml Nml Nml Nml Nml Nml Nml Nml Nml N/A  Right Gastroc Nml Nml Nml Nml Nml Nml Nml Nml Nml Nml Nml Nml N/A  Right Flex  Dig Long Nml Nml Nml Nml Nml Nml Nml Nml Nml Nml Nml Nml N/A  Right RectFemoris Nml Nml Nml Nml Nml Nml Nml Nml Nml Nml Nml Nml N/A  Right GluteusMed Nml Nml Nml Nml Nml Nml Nml Nml Nml Nml Nml Nml N/A  Left BicepsFemS Nml Nml Nml Nml Nml Nml Nml Nml Nml Nml Nml Nml N/A  Right BicepsFemS Nml Nml Nml Nml Nml Nml Nml Nml Nml Nml Nml Nml N/A  Left AntTibialis Nml Nml Nml Nml Nml Nml Nml Nml Nml Nml Nml Nml N/A  Left Gastroc Nml Nml Nml Nml Nml Nml Nml Nml Nml Nml Nml Nml N/A  Left Flex Dig Long Nml Nml Nml Nml Nml Nml Nml Nml Nml Nml Nml Nml N/A  Left RectFemoris Nml Nml Nml Nml Nml Nml Nml Nml Nml Nml Nml Nml N/A  Left GluteusMed Nml Nml Nml Nml Nml Nml Nml Nml Nml Nml Nml Nml N/A      Waveforms:

## 2021-04-15 ENCOUNTER — Ambulatory Visit: Payer: 59 | Admitting: Podiatry

## 2021-04-19 DIAGNOSIS — E639 Nutritional deficiency, unspecified: Secondary | ICD-10-CM | POA: Diagnosis not present

## 2021-04-19 DIAGNOSIS — G479 Sleep disorder, unspecified: Secondary | ICD-10-CM | POA: Diagnosis not present

## 2021-04-21 ENCOUNTER — Encounter: Payer: Self-pay | Admitting: Family Medicine

## 2021-04-21 MED ORDER — TRAZODONE HCL 50 MG PO TABS: 25.0000 mg | ORAL_TABLET | Freq: Every evening | ORAL | 1 refills | Status: DC | PRN

## 2021-04-22 ENCOUNTER — Ambulatory Visit (INDEPENDENT_AMBULATORY_CARE_PROVIDER_SITE_OTHER): Payer: 59 | Admitting: Podiatry

## 2021-04-22 ENCOUNTER — Ambulatory Visit: Payer: 59

## 2021-04-22 ENCOUNTER — Other Ambulatory Visit: Payer: Self-pay

## 2021-04-22 DIAGNOSIS — M7751 Other enthesopathy of right foot: Secondary | ICD-10-CM | POA: Diagnosis not present

## 2021-04-22 DIAGNOSIS — M25879 Other specified joint disorders, unspecified ankle and foot: Secondary | ICD-10-CM | POA: Diagnosis not present

## 2021-04-22 DIAGNOSIS — M7752 Other enthesopathy of left foot: Secondary | ICD-10-CM | POA: Diagnosis not present

## 2021-04-22 MED ORDER — NAPROXEN 500 MG PO TABS: 500.0000 mg | ORAL_TABLET | Freq: Two times a day (BID) | ORAL | 2 refills | Status: DC

## 2021-04-22 NOTE — Progress Notes (Signed)
   HPI: 45 y.o. male presenting today for evaluation of chronic bilateral great toe pain.  Patient has a past surgical history of right great toe arthroplasty with implant as well as cheilectomy to the left great toe.  He has been experiencing pain to the bilateral great toes on the plantar aspect sesamoids.  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  Dermatology: Skin is warm, dry and supple bilateral lower extremities. Negative for open lesions or macerations.  Vascular: Palpable pedal pulses bilaterally. No edema or erythema noted. Capillary refill within normal limits.  Neurological: Epicritic and protective threshold grossly intact bilaterally.   Musculoskeletal Exam: Pain on palpation noted to the plantar aspect of the first MTP joint bilateral consistent with a sesamoiditis  Radiographic Exam:  IT issues with the x-rays today.  x-rays were taken today but they are not visible and not transferring over to our epic hospital system.  Assessment: 1. H/o RT great toe implant 02/23/2019. 2. H/o Cheilectomy B/L 05/13/2018 3.  Chronic sesamoiditis bilateral   Plan of Care:  1. Patient evaluated. X-Rays reviewed.  2.  Today were going to order custom molded orthotics to support the medial longitudinal arch of the foot and alleviate pressure from the first MTP  joint 3.  Prescription for naproxen 500 mg 2 times daily as needed 4.  Return to clinic with Pedorthist for orthotics fitting      Edrick Kins, DPM [facility]  Dr. Edrick Kins, DPM    [address], [place] [address]                Office [phone]  Fax [phone]

## 2021-04-22 NOTE — Addendum Note (Signed)
Addended by: Edrick Kins on: 04/22/2021 10:51 AM   Modules accepted: Orders

## 2021-04-24 ENCOUNTER — Encounter: Payer: Self-pay | Admitting: Family Medicine

## 2021-04-24 ENCOUNTER — Other Ambulatory Visit: Payer: Self-pay

## 2021-04-24 ENCOUNTER — Ambulatory Visit: Payer: 59 | Admitting: Family Medicine

## 2021-04-24 VITALS — BP 114/76 | HR 69 | Temp 98.2°F | Wt 235.8 lb

## 2021-04-24 DIAGNOSIS — R1032 Left lower quadrant pain: Secondary | ICD-10-CM

## 2021-04-24 DIAGNOSIS — R6889 Other general symptoms and signs: Secondary | ICD-10-CM

## 2021-04-24 DIAGNOSIS — R253 Fasciculation: Secondary | ICD-10-CM

## 2021-04-24 DIAGNOSIS — Z23 Encounter for immunization: Secondary | ICD-10-CM | POA: Diagnosis not present

## 2021-04-24 MED ORDER — GABAPENTIN 300 MG PO CAPS: 300.0000 mg | ORAL_CAPSULE | Freq: Three times a day (TID) | ORAL | 1 refills | Status: DC

## 2021-04-24 NOTE — Assessment & Plan Note (Signed)
Continue to follow with neurology. They suggested movement disorder clinic. Will place referral to expidite before he follows up with neuro in February. Call with any concerns. Continue to monitor.

## 2021-04-24 NOTE — Progress Notes (Signed)
BP 114/76   Pulse 69   Temp 98.2 F (36.8 C)   Wt 235 lb 12.8 oz (107 kg)   SpO2 98%   BMI 30.27 kg/m    Subjective:    Patient ID: John Huang, male    DOB: 21-Mar-1976, 45 y.o.   MRN: [id number]  HPI: John Huang is a 45 y.o. male  Chief Complaint  Patient presents with   Abdominal Pain    Patient states he is laving left abdominal pain.    facial tick    Patient would like to discuss MRI results he had done in October, states his neurologist has not had a follow up with him about it and has some questions.   Having nerve fasciculations. Has been seeing neurology, but does not have a follow up until February. Continues with fasciulations, jaw clenching. He notes that he is feeling significantly better, but still not normal. He had an EMG which was normal and had an MRI which was also normal.   ABDOMINAL PAIN  Duration:chronic Onset: gradual Severity: 2/10 Quality: tight, sharp, stinging Location:  LLQ  Episode duration: 2-3 minutes   Radiation: none Frequency: 10-12x a day Alleviating factors: pushing on it  Aggravating factors: lifting his leg Status: better Treatments attempted: none Fever: no Nausea: no Vomiting: no Weight loss: no Decreased appetite: no Diarrhea: no Constipation: no Blood in stool: no Heartburn: no Jaundice: no Rash: no Dysuria/urinary frequency: no Hematuria: no History of sexually transmitted disease: no Recurrent NSAID use: no   Relevant past medical, surgical, family and social history reviewed and updated as indicated. Interim medical history since our last visit reviewed. Allergies and medications reviewed and updated.  Review of Systems  Constitutional: Negative.   Respiratory: Negative.    Cardiovascular: Negative.   Gastrointestinal:  Positive for abdominal pain. Negative for abdominal distention, anal bleeding, blood in stool, constipation, diarrhea, nausea, rectal pain and vomiting.  Skin: Negative.    Neurological:  Negative for dizziness, tremors, seizures, syncope, facial asymmetry, speech difficulty, weakness, light-headedness, numbness and headaches.  Psychiatric/Behavioral: Negative.     Per HPI unless specifically indicated above     Objective:    BP 114/76   Pulse 69   Temp 98.2 F (36.8 C)   Wt 235 lb 12.8 oz (107 kg)   SpO2 98%   BMI 30.27 kg/m   Wt Readings from Last 3 Encounters:  04/24/21 235 lb 12.8 oz (107 kg)  02/10/21 243 lb (110.2 kg)  02/06/21 242 lb 6.4 oz (110 kg)    Physical Exam Vitals and nursing note reviewed.  Constitutional:      General: He is not in acute distress.    Appearance: Normal appearance. He is not ill-appearing, toxic-appearing or diaphoretic.  HENT:     Head: Normocephalic and atraumatic.     Right Ear: External ear normal.     Left Ear: External ear normal.     Nose: Nose normal.     Mouth/Throat:     Mouth: Mucous membranes are moist.     Pharynx: Oropharynx is clear.  Eyes:     General: No scleral icterus.       Right eye: No discharge.        Left eye: No discharge.     Extraocular Movements: Extraocular movements intact.     Conjunctiva/sclera: Conjunctivae normal.     Pupils: Pupils are equal, round, and reactive to light.  Cardiovascular:     Rate and Rhythm: Normal  rate and regular rhythm.     Pulses: Normal pulses.     Heart sounds: Normal heart sounds. No murmur heard.   No friction rub. No gallop.  Pulmonary:     Effort: Pulmonary effort is normal. No respiratory distress.     Breath sounds: Normal breath sounds. No stridor. No wheezing, rhonchi or rales.  Chest:     Chest wall: No tenderness.  Abdominal:     General: Abdomen is flat. Bowel sounds are normal.     Palpations: Abdomen is soft.     Tenderness: There is abdominal tenderness in the left lower quadrant.  Musculoskeletal:        General: Normal range of motion.     Cervical back: Normal range of motion and neck supple.  Skin:    General: Skin  is warm and dry.     Capillary Refill: Capillary refill takes less than 2 seconds.     Coloration: Skin is not jaundiced or pale.     Findings: No bruising, erythema, lesion or rash.  Neurological:     General: No focal deficit present.     Mental Status: He is alert and oriented to person, place, and time. Mental status is at baseline.  Psychiatric:        Mood and Affect: Mood normal.        Behavior: Behavior normal.        Thought Content: Thought content normal.        Judgment: Judgment normal.    Results for orders placed or performed in visit on 51/02/58  Basic metabolic panel  Result Value Ref Range   Glucose 98 65 - 99 mg/dL   BUN 10 6 - 24 mg/dL   Creatinine, Ser 1.22 0.76 - 1.27 mg/dL   eGFR 75 >59 mL/min/1.73   BUN/Creatinine Ratio 8 (L) 9 - 20   Sodium 140 134 - 144 mmol/L   Potassium 4.5 3.5 - 5.2 mmol/L   Chloride 104 96 - 106 mmol/L   CO2 21 20 - 29 mmol/L   Calcium 9.4 8.7 - 10.2 mg/dL  CBC with Differential/Platelet  Result Value Ref Range   WBC 4.8 3.4 - 10.8 x10E3/uL   RBC 5.03 4.14 - 5.80 x10E6/uL   Hemoglobin 14.2 13.0 - 17.7 g/dL   Hematocrit 44.1 37.5 - 51.0 %   MCV 88 79 - 97 fL   MCH 28.2 26.6 - 33.0 pg   MCHC 32.2 31.5 - 35.7 g/dL   RDW 14.5 11.6 - 15.4 %   Platelets 277 150 - 450 x10E3/uL   Neutrophils 55 Not Estab. %   Lymphs 35 Not Estab. %   Monocytes 7 Not Estab. %   Eos 2 Not Estab. %   Basos 1 Not Estab. %   Neutrophils Absolute 2.7 1.4 - 7.0 x10E3/uL   Lymphocytes Absolute 1.7 0.7 - 3.1 x10E3/uL   Monocytes Absolute 0.3 0.1 - 0.9 x10E3/uL   EOS (ABSOLUTE) 0.1 0.0 - 0.4 x10E3/uL   Basophils Absolute 0.0 0.0 - 0.2 x10E3/uL   Immature Granulocytes 0 Not Estab. %   Immature Grans (Abs) 0.0 0.0 - 0.1 x10E3/uL      Assessment & Plan:   Problem List Items Addressed This Visit       Other   Abnormal movement of jaw    Continue to follow with neurology. They suggested movement disorder clinic. Will place referral to expidite before  he follows up with neuro in February. Call with any concerns. Continue  to monitor.       Relevant Orders   Ambulatory referral to Neurology   Fasciculations    Continue to follow with neurology. They suggested movement disorder clinic. Will place referral to expidite before he follows up with neuro in February. Call with any concerns. Continue to monitor.       Relevant Orders   Ambulatory referral to Neurology   Other Visit Diagnoses     Left lower quadrant abdominal pain    -  Primary   Will check CT abdomen to r/o diverticulitis. ?psoas spasm. Discussed exercises and PT with wife. Call with any concerns. Continue to monitor.    Relevant Orders   CT Abdomen Pelvis Wo Contrast        Follow up plan: Return March.

## 2021-04-30 ENCOUNTER — Other Ambulatory Visit: Payer: Self-pay

## 2021-04-30 ENCOUNTER — Ambulatory Visit (INDEPENDENT_AMBULATORY_CARE_PROVIDER_SITE_OTHER): Payer: 59 | Admitting: Neurology

## 2021-04-30 ENCOUNTER — Encounter: Payer: Self-pay | Admitting: Neurology

## 2021-04-30 ENCOUNTER — Other Ambulatory Visit: Payer: Self-pay | Admitting: Family Medicine

## 2021-04-30 VITALS — BP 130/79 | HR 81 | Ht 74.0 in | Wt 238.0 lb

## 2021-04-30 DIAGNOSIS — R253 Fasciculation: Secondary | ICD-10-CM | POA: Diagnosis not present

## 2021-04-30 MED FILL — Citalopram Hydrobromide Tab 40 MG (Base Equiv): ORAL | 90 days supply | Qty: 90 | Fill #0 | Status: AC

## 2021-04-30 NOTE — Telephone Encounter (Signed)
Request change in [place] of Rx sent to new pharmacy Requested Prescriptions  Pending Prescriptions Disp Refills   citalopram (CELEXA) 40 MG tablet 90 tablet 0    Sig: TAKE 1 TABLET BY MOUTH DAILY.     Psychiatry:  Antidepressants - SSRI Passed - 04/30/2021 12:07 PM      Passed - Valid encounter within last 6 months    Recent Outpatient Visits          6 days ago Left lower quadrant abdominal pain   [place], DO   2 months ago Need for influenza vaccination   [place], Megan P, DO   4 months ago Bulging lumbar disc   [place], [place], DO   5 months ago Perirectal fistula   [place], DO   7 months ago Routine general medical examination at a health care facility   [place], Barb Merino, DO      Future Appointments            In 1 week Wynetta Emery, Barb Merino, DO [place], [place]   In 1 month Lucilla Lame, MD [place]

## 2021-04-30 NOTE — Progress Notes (Signed)
Follow-up Visit   Date: 04/30/21   John Huang MRN: [id number] DOB: April 10, 1976   Interim History: John Huang is a 45 y.o. right-handed male returning to the clinic for follow-up of fasciculations.  The patient was accompanied to the clinic by self.  History of present illness:  He was planning to have colorectal surgery on June 13th and underwent spinal block and had botox for rectal tear.  His surgery was cancelled, however, the following day, he developed bilateral leg fasciculations.  He has noticed that stress makes it worse. He takes gabapentin 600mg  three times daily which makes it less noticeable. He has leg cramps.  He denies weakness in the arms and legs. No numbness/tingling. He developed low back pain around the same time.     UPDATE 04/30/2021: He is here for follow-up visit. NCS/EMG of the legs and MRI brain has been normal. He continues to have muscle twitches involving the calves.  There is no pain or weakness. It is more noticeable at night time. He takes gabapentin 600mg  TID which helps, but he really would like for them to go away.  Medications:  Current Outpatient Medications on File Prior to Visit  Medication Sig Dispense Refill   amLODipine (NORVASC) 5 MG tablet TAKE 1 TABLET BY MOUTH DAILY. 90 tablet 1   citalopram (CELEXA) 40 MG tablet TAKE 1 TABLET BY MOUTH DAILY. 90 tablet 1   clotrimazole-betamethasone (LOTRISONE) cream Apply 1 application topically daily. 30 g 0   fexofenadine (ALLEGRA) 180 MG tablet Take 1-2 tablets (180-360 mg total) by mouth daily. 180 tablet 3   finasteride (PROPECIA) 1 MG tablet Take 1 mg by mouth daily. am     gabapentin (NEURONTIN) 300 MG capsule Take 1 capsule (300 mg total) by mouth 3 (three) times daily. 270 capsule 1   Multiple Vitamin (MULTI-VITAMINS) TABS Take by mouth.     naproxen (NAPROSYN) 500 MG tablet Take 1 tablet (500 mg total) by mouth 2 (two) times daily with a meal. 60 tablet 2   nystatin-triamcinolone  (MYCOLOG II) cream Apply topically 4 (four) times daily.     omeprazole (PRILOSEC) 40 MG capsule Take 1 capsule (40 mg total) by mouth daily. 90 capsule 1   traZODone (DESYREL) 50 MG tablet Take 0.5-1 tablets (25-50 mg total) by mouth at bedtime as needed for sleep. 90 tablet 1   No current facility-administered medications on file prior to visit.    Allergies:  Allergies  Allergen Reactions   Cat Hair Extract Hives and Other (See Comments)    Wheezing, eyes itch Wheezing, eyes itch Wheezing, eyes itch   Other Hives and Other (See Comments)    Wheezing, eyes itch   Alpha-Gal Hives   Galactose Other (See Comments)    Flu like symptoms after consuming mammalian products Flu like symptoms after consuming mammalian products Flu like symptoms after consuming mammalian products Flu like symptoms after consuming mammalian products    Vital Signs:  BP 130/79    Pulse 81    Ht 6\' 2"  (1.88 m)    Wt 238 lb (108 kg)    SpO2 100%    BMI 30.56 kg/m   Neurological Exam: MENTAL STATUS including orientation to time, place, person, recent and remote memory, attention span and concentration, language, and fund of knowledge is normal.  Speech is not dysarthric.  CRANIAL NERVES:  Pupils equal round and reactive to light.  Normal conjugate, extra-ocular eye movements in all directions of gaze.  No  ptosis.  Face is symmetric.   MOTOR:  Motor strength is 5/5 in all extremities.  No atrophy, fasciculations or abnormal movements.  No pronator drift.  Tone is normal.    MSRs:  Reflexes are 2+/4 throughout .  SENSORY:  Intact to vibration throughout.  COORDINATION/GAIT:  Normal finger-to- nose-finger.   Gait narrow based and stable.   Data: NCS/EMG of the legs 04/08/2021: This is a normal study of the lower extremities.  In particular, there is no evidence of a sensorimotor polyneuropathy or lumbosacral radiculopathy.  MRI brain wwo contrast 03/02/2021: No acute abnormality.  No intracranial mass  or abnormal enhancement.   MRI lumbar spine wo contrast 02/29/2020: Transitional lumbosacral anatomy. 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 The the inferior-most fully formed intervertebral disc is labeled S1-S2.   1. At L5-S1 there is a broad-based disc bulge with mild canal stenosis and moderate bilateral subarticular recess stenosis with disc contacting bilateral descending L5 nerve roots.    IMPRESSION/PLAN: Benign fasciculations.  Patient informed that normal electrodiagnostic testing, MRI brain, and exam is very reassuring. He does have some bilateral subarticular recess stenosis contacting L5 nerve roots bilaterally and this may contribute to symptoms, although I would expect fasciculations more along the L5 myotome.  I suggest a trial of physical therapy for low back strengthening. He will discuss this with his wife and let us know.  I also informed him that often, we do not treat fasciculations, but he is hopeful to try to get relief. He may continue gabapentin 600mg  TID.     Thank you for allowing me to participate in patient's care.  If I can answer any additional questions, I would be pleased to do so.    Sincerely,    Adeoluwa Silvers K. Posey Pronto, DO

## 2021-04-30 NOTE — Telephone Encounter (Signed)
Pt called and reported that he ran out of his current supply on Monday, pt is requesting urgent refill.

## 2021-04-30 NOTE — Patient Instructions (Signed)
Please let me know if you would like to proceed with physical therapy

## 2021-05-01 ENCOUNTER — Other Ambulatory Visit: Payer: Self-pay

## 2021-05-08 ENCOUNTER — Ambulatory Visit: Payer: 59 | Admitting: Family Medicine

## 2021-05-15 ENCOUNTER — Other Ambulatory Visit: Payer: Self-pay

## 2021-05-15 ENCOUNTER — Ambulatory Visit
Admission: RE | Admit: 2021-05-15 | Discharge: 2021-05-15 | Disposition: A | Discharge status: Home / Self Care | Payer: 59 | Source: Ambulatory Visit | Attending: Family Medicine | Admitting: Family Medicine

## 2021-05-15 DIAGNOSIS — R1032 Left lower quadrant pain: Secondary | ICD-10-CM | POA: Insufficient documentation

## 2021-05-15 IMAGING — CT CT ABD-PELV W/O CM
2 of 4 series · 16 of 46 positions shown, 18 images · non-contrast
Comparison: None.

CLINICAL DATA: Left lower quadrant abdominal pain for 2 months.

EXAM:
CT ABDOMEN AND PELVIS WITHOUT CONTRAST
TECHNIQUE: Multidetector CT imaging of the abdomen and pelvis was performed
following the standard protocol without IV contrast.

[Series 2: axial st · axial · 0.82mm/px · z∈[-563,-88]mm · 13 of 105 slices shown, 15 images]
[im 5/105  soft-tissue]
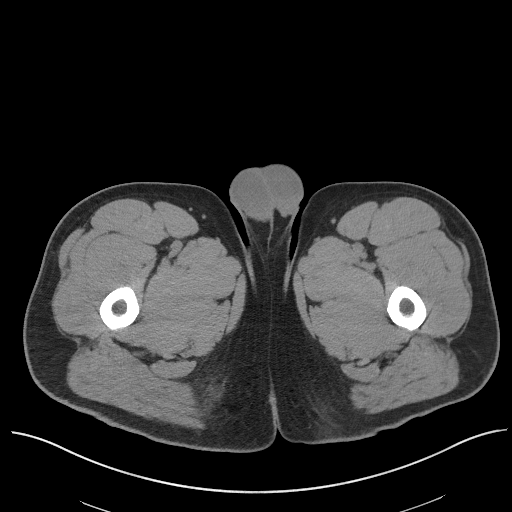
[im 5/105  bone]
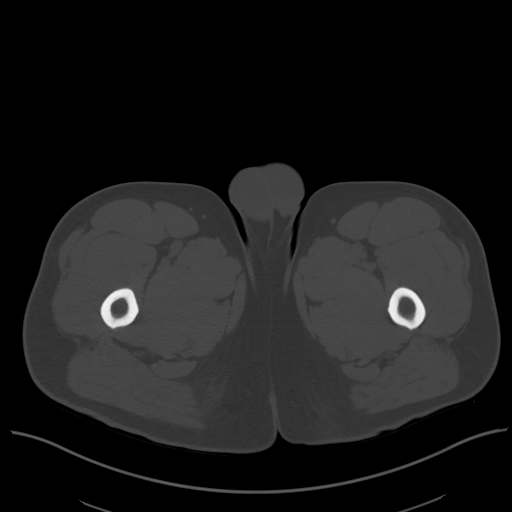
[im 14/105  soft-tissue]
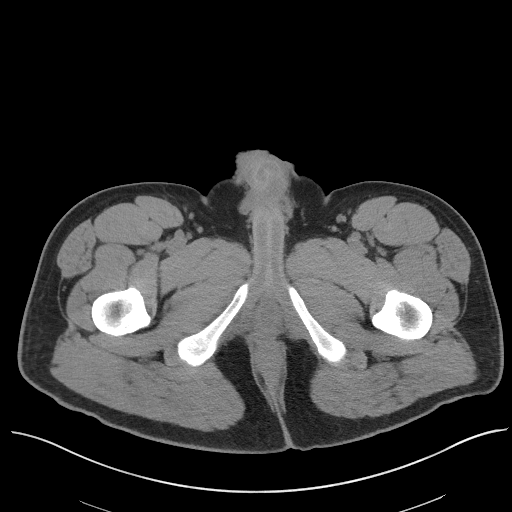
[im 22/105  soft-tissue]
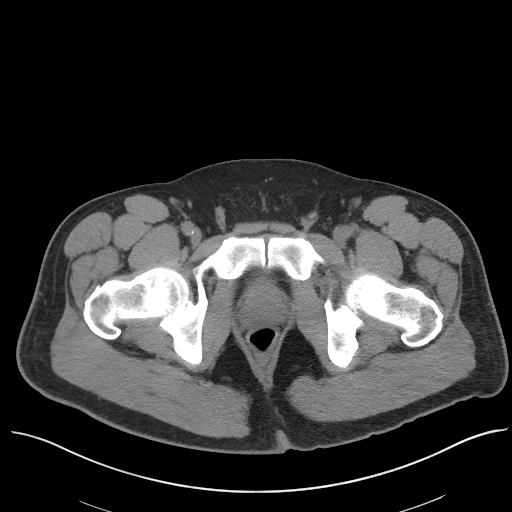
[im 31/105  soft-tissue]
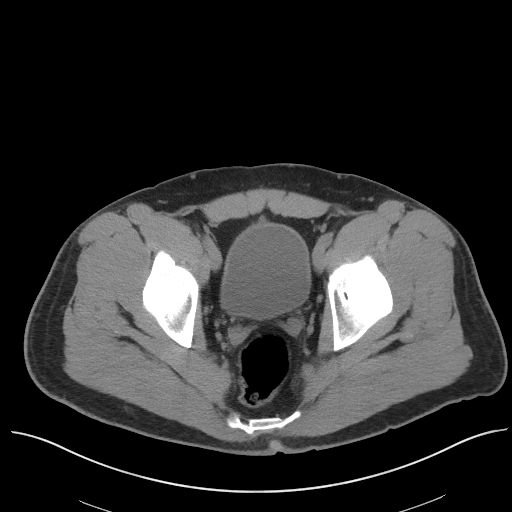
[im 35/105  soft-tissue]
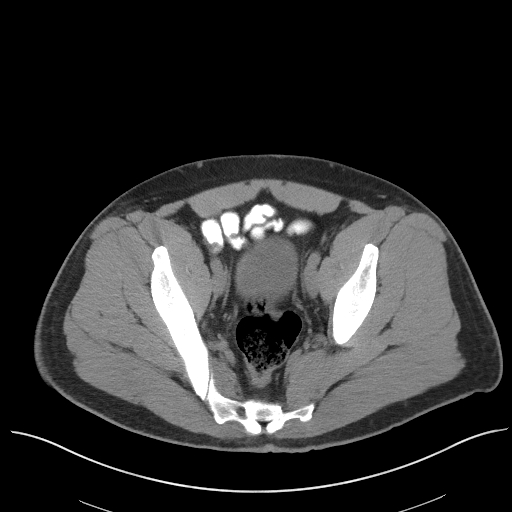
[im 44/105  soft-tissue]
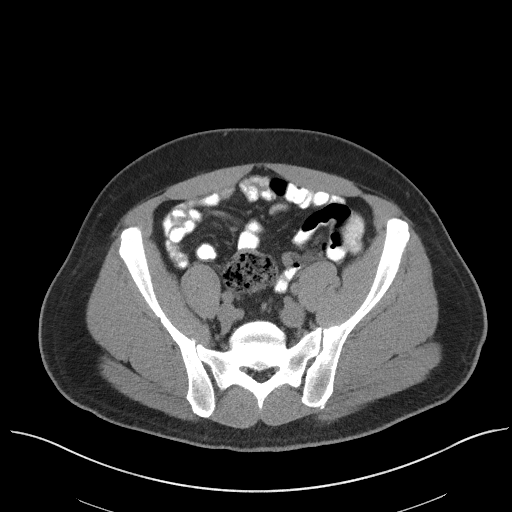
[im 53/105  soft-tissue]
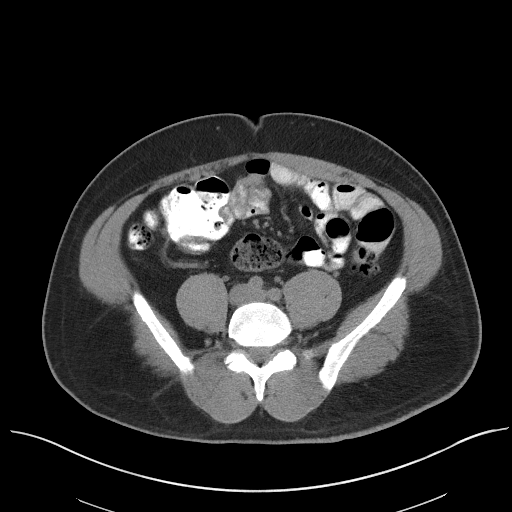
[im 61/105  soft-tissue]
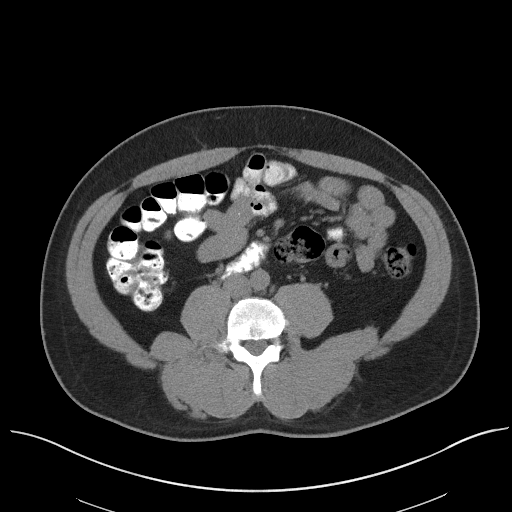
[im 70/105  soft-tissue]
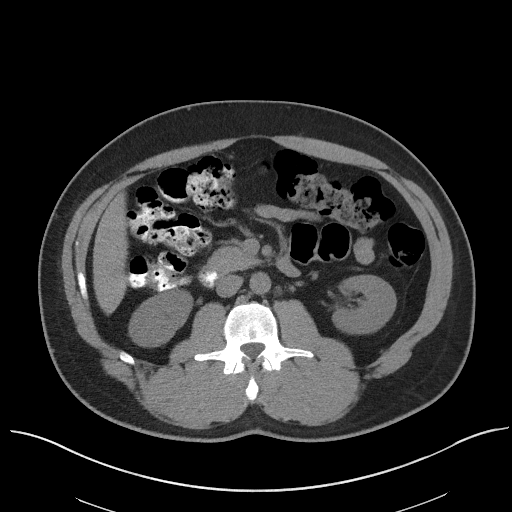
[im 70/105  bone]
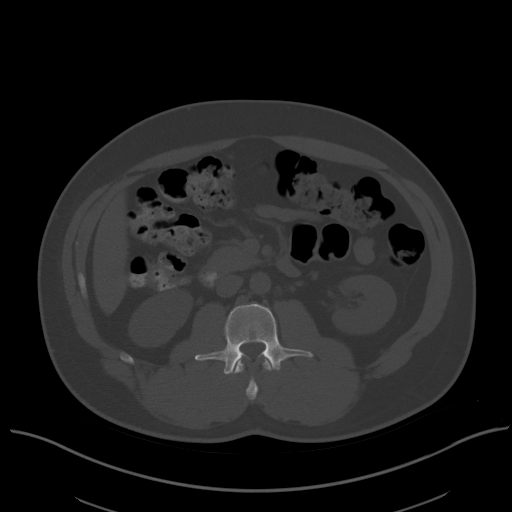
[im 74/105  soft-tissue]
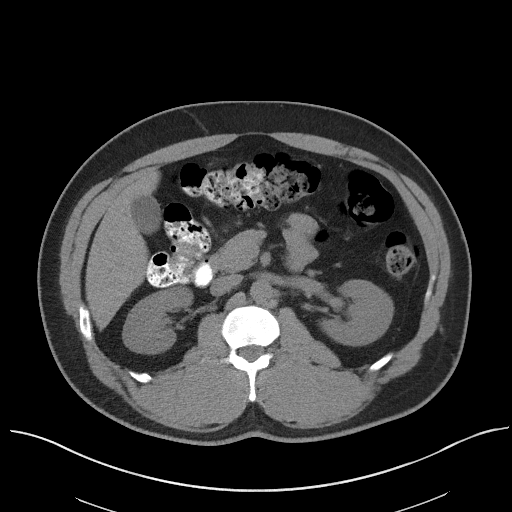
[im 83/105  soft-tissue]
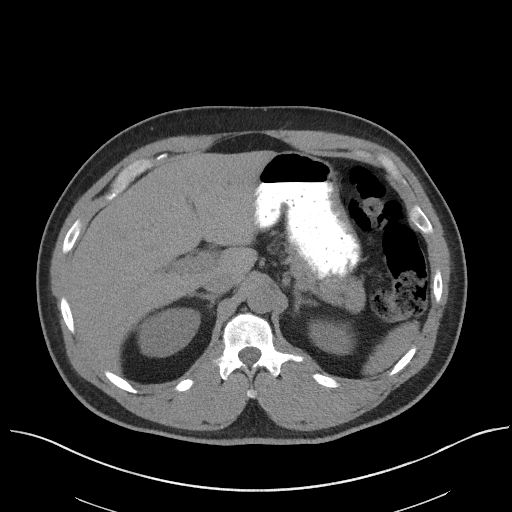
[im 92/105  soft-tissue]
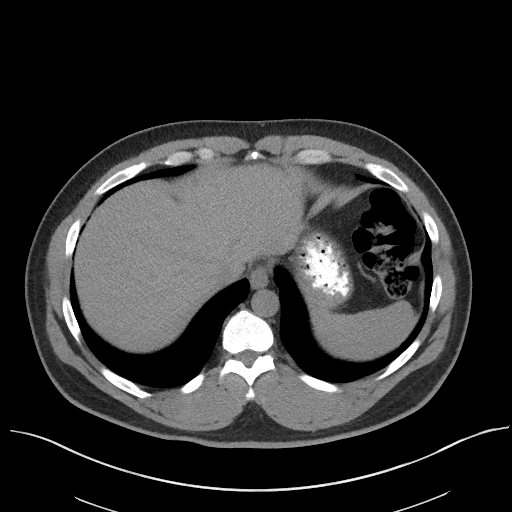
[im 100/105  soft-tissue]
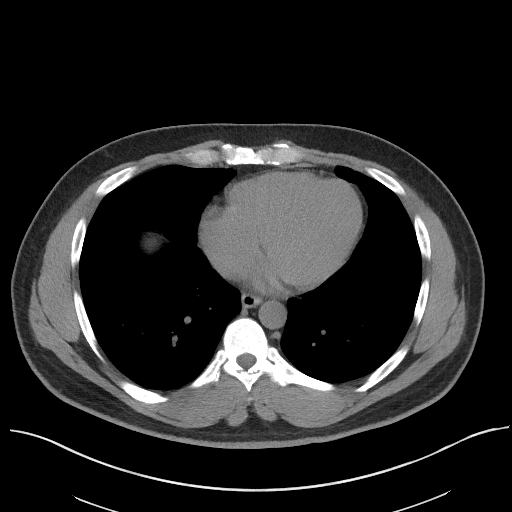

[Series 5: coronal st · coronal · 0.84mm/px · 3 of 99 slices shown]
[im 33/99  soft-tissue]
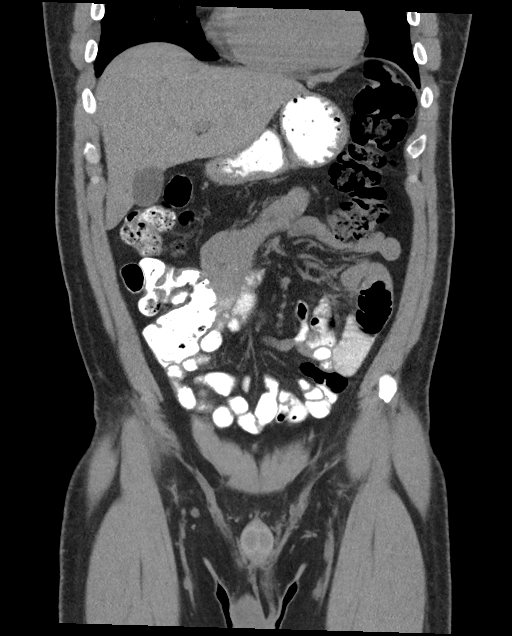
[im 44/99  soft-tissue]
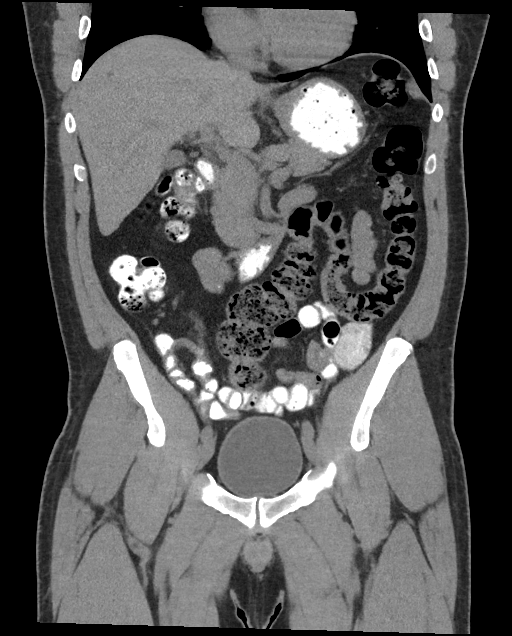
[im 55/99  soft-tissue]
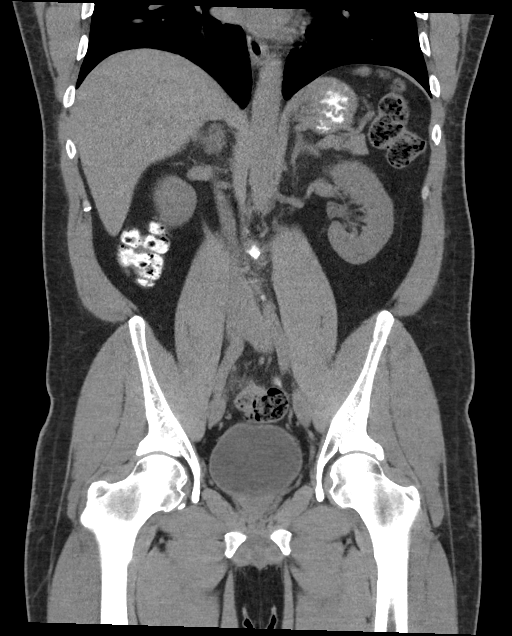

[16 of 46 positions shown; findings below may reference images not displayed]

FINDINGS: Lower chest: No acute abnormality.

Hepatobiliary: No focal liver abnormality is seen. No gallstones,
gallbladder wall thickening, or biliary dilatation.

Pancreas: Unremarkable. No pancreatic ductal dilatation or
surrounding inflammatory changes.

Spleen: Normal in size without focal abnormality.

Adrenals/Urinary Tract: Adrenal glands are unremarkable. Kidneys are
normal, without renal calculi, focal lesion, or hydronephrosis.
Bladder is unremarkable.

Stomach/Bowel: Stomach is within normal limits. Appendix appears
normal. No evidence of bowel wall thickening, distention, or
inflammatory changes.

Vascular/Lymphatic: No significant vascular findings are present. No
enlarged abdominal or pelvic lymph nodes.

Reproductive: Prostate is unremarkable.

Other: No abdominal wall hernia or abnormality. No abdominopelvic
ascites.

Musculoskeletal: No acute or significant osseous findings.
IMPRESSION: No definite abnormality seen in the abdomen or pelvis.

## 2021-05-16 ENCOUNTER — Ambulatory Visit: Payer: 59

## 2021-05-16 DIAGNOSIS — M7751 Other enthesopathy of right foot: Secondary | ICD-10-CM

## 2021-05-16 DIAGNOSIS — M7752 Other enthesopathy of left foot: Secondary | ICD-10-CM | POA: Diagnosis not present

## 2021-05-16 DIAGNOSIS — M25879 Other specified joint disorders, unspecified ankle and foot: Secondary | ICD-10-CM | POA: Diagnosis not present

## 2021-05-20 NOTE — Progress Notes (Signed)
SITUATION Reason for Consult: Evaluation for Bilateral Custom Foot Orthoses Patient / Caregiver Report: Patient reports he mostly wears cowboy boots but would like something for when he is wearing sneakers for exercise  OBJECTIVE DATA: Patient History / Diagnosis:    ICD-10-CM   1. Sesamoiditis of foot, unspecified laterality  M25.879     2. Capsulitis of metatarsophalangeal (MTP) joints of both feet  M77.51    M77.52       Current or Previous Devices: None and no history  Foot Examination: Skin presentation:   Intact Ulcers & [place]:   None and no history Toe / Foot Deformities:  [place] Weight Bearing Presentation:  Rectus Sensation:    Intact  ORTHOTIC RECOMMENDATION Recommended Device: 1x pair of custom functional foot orthoses  GOALS OF ORTHOSES - Reduce Pain - Prevent Foot Deformity - Prevent Progression of Further Foot Deformity - Relieve Pressure - Improve the Overall Biomechanical Function of the Foot and Lower Extremity.  ACTIONS PERFORMED Patient was casted for Foot Orthoses via crush box. Procedure was explained and patient tolerated procedure well. All questions were answered and concerns addressed.  PLAN Potential out of pocket cost was communicated to patient. Casts are to be sent to [facility] for fabrication. Patient is to be called for fitting when devices are ready.

## 2021-05-26 ENCOUNTER — Other Ambulatory Visit: Payer: Self-pay | Admitting: Family Medicine

## 2021-05-26 NOTE — Telephone Encounter (Signed)
Requested Prescriptions  Pending Prescriptions Disp Refills   amLODipine (NORVASC) 5 MG tablet 90 tablet 1    Sig: TAKE 1 TABLET BY MOUTH DAILY.     Cardiovascular:  Calcium Channel Blockers Passed - 05/26/2021  3:10 PM      Passed - Last BP in normal range    BP Readings from Last 1 Encounters:  04/30/21 130/79         Passed - Valid encounter within last 6 months    Recent Outpatient Visits          1 month ago Left lower quadrant abdominal pain   [place], DO   3 months ago Need for influenza vaccination   [place], Megan P, DO   5 months ago Bulging lumbar disc   [place], [place], DO   6 months ago Perirectal fistula   [place], DO   8 months ago Routine general medical examination at a health care facility   [place], Barb Merino, DO      Future Appointments            In 1 week Lucilla Lame, MD [place]

## 2021-05-26 NOTE — Telephone Encounter (Signed)
Medication Refill - Medication: amLODipine (NORVASC) 5 MG tablet  Has the patient contacted their pharmacy? No.  (Agent: If no, request that the patient contact the pharmacy for the refill. If patient does not wish to contact the pharmacy document the reason why and proceed with request.) Patient was unsure to call his pharmacy because he is requesting a different pharmacy   Preferred Pharmacy (with phone number or street name):  [facility], [place] - [address] [address] Phone:  [phone]  Fax:  [phone]      Has the patient been seen for an appointment in the last year OR does the patient have an upcoming appointment? Yes.    Agent: Please be advised that RX refills may take up to 3 business days. We ask that you follow-up with your pharmacy.

## 2021-06-03 ENCOUNTER — Ambulatory Visit: Payer: 59 | Admitting: Gastroenterology

## 2021-06-16 ENCOUNTER — Ambulatory Visit: Payer: Self-pay

## 2021-06-16 ENCOUNTER — Other Ambulatory Visit: Payer: Self-pay

## 2021-06-16 DIAGNOSIS — M7751 Other enthesopathy of right foot: Secondary | ICD-10-CM

## 2021-06-16 DIAGNOSIS — M7752 Other enthesopathy of left foot: Secondary | ICD-10-CM

## 2021-06-16 DIAGNOSIS — M25879 Other specified joint disorders, unspecified ankle and foot: Secondary | ICD-10-CM

## 2021-06-16 NOTE — Progress Notes (Signed)
SITUATION: Reason for Visit: Fitting and Delivery of Custom Fabricated Foot Orthoses Patient Report: Patient reports comfort and is satisfied with device.  OBJECTIVE DATA: Patient History / Diagnosis:     ICD-10-CM   1. Sesamoiditis of foot, unspecified laterality  M25.879     2. Capsulitis of metatarsophalangeal (MTP) joints of both feet  M77.51    M77.52       Provided Device:  Custom Functional Foot Orthotics     [facility]: [phone]  GOAL OF ORTHOSIS - Improve gait - Decrease energy expenditure - Improve Balance - Provide Triplanar stability of foot complex - Facilitate motion  ACTIONS PERFORMED Patient was fit with foot orthotics trimmed to shoe last. Patient tolerated fittign procedure.   Patient was provided with verbal and written instruction and demonstration regarding donning, doffing, wear, care, proper fit, function, purpose, cleaning, and use of the orthosis and in all related precautions and risks and benefits regarding the orthosis.  Patient was also provided with verbal instruction regarding how to report any failures or malfunctions of the orthosis and necessary follow up care. Patient was also instructed to contact our office regarding any change in status that may affect the function of the orthosis.  Patient demonstrated independence with proper donning, doffing, and fit and verbalized understanding of all instructions.  PLAN: Patient is to follow up in one week or as necessary (PRN). All questions were answered and concerns addressed. Plan of care was discussed with and agreed upon by the patient.

## 2021-07-09 ENCOUNTER — Ambulatory Visit: Payer: 59 | Admitting: Neurology

## 2021-07-25 DIAGNOSIS — M62838 Other muscle spasm: Secondary | ICD-10-CM | POA: Diagnosis not present

## 2021-07-25 DIAGNOSIS — R6884 Jaw pain: Secondary | ICD-10-CM | POA: Diagnosis not present

## 2021-08-07 ENCOUNTER — Other Ambulatory Visit: Payer: Self-pay

## 2021-08-11 DIAGNOSIS — M25511 Pain in right shoulder: Secondary | ICD-10-CM | POA: Diagnosis not present

## 2021-08-11 DIAGNOSIS — M7711 Lateral epicondylitis, right elbow: Secondary | ICD-10-CM | POA: Diagnosis not present

## 2021-08-18 DIAGNOSIS — E349 Endocrine disorder, unspecified: Secondary | ICD-10-CM | POA: Diagnosis not present

## 2021-08-18 DIAGNOSIS — L272 Dermatitis due to ingested food: Secondary | ICD-10-CM | POA: Diagnosis not present

## 2021-09-09 DIAGNOSIS — R609 Edema, unspecified: Secondary | ICD-10-CM | POA: Diagnosis not present

## 2021-09-09 DIAGNOSIS — K219 Gastro-esophageal reflux disease without esophagitis: Secondary | ICD-10-CM | POA: Diagnosis not present

## 2021-09-09 DIAGNOSIS — Z77120 Contact with and (suspected) exposure to mold (toxic): Secondary | ICD-10-CM | POA: Diagnosis not present

## 2021-09-09 DIAGNOSIS — R61 Generalized hyperhidrosis: Secondary | ICD-10-CM | POA: Diagnosis not present

## 2021-09-09 DIAGNOSIS — Z91018 Allergy to other foods: Secondary | ICD-10-CM | POA: Diagnosis not present

## 2021-10-01 DIAGNOSIS — M25511 Pain in right shoulder: Secondary | ICD-10-CM | POA: Diagnosis not present

## 2021-10-21 DIAGNOSIS — M25511 Pain in right shoulder: Secondary | ICD-10-CM | POA: Diagnosis not present

## 2021-10-24 ENCOUNTER — Other Ambulatory Visit: Payer: Self-pay | Admitting: Family Medicine

## 2021-10-24 NOTE — Telephone Encounter (Signed)
Call to patient- he is driving and states he will call back to schedule appointment- courtesy #[phone] given Requested Prescriptions  Pending Prescriptions Disp Refills  . traZODone (DESYREL) 50 MG tablet [Pharmacy Med Name: traZODone HCl 50 MG Oral Tablet] 90 tablet 0    Sig: TAKE 1/2 TO 1 (ONE-HALF TO ONE) TABLET BY MOUTH AT BEDTIME AS NEEDED FOR SLEEP     Psychiatry: Antidepressants - Serotonin Modulator Failed - 10/24/2021 12:31 PM      Failed - Valid encounter within last 6 months    Recent Outpatient Visits          6 months ago Left lower quadrant abdominal pain   [place], DO   8 months ago Need for influenza vaccination   [place], Megan P, DO   10 months ago Bulging lumbar disc   [place], [place], DO   11 months ago Perirectal fistula   [place], [place], DO   1 year ago Routine general medical examination at a health care facility   [facility], [place], DO

## 2021-12-12 ENCOUNTER — Other Ambulatory Visit: Payer: Self-pay | Admitting: Family Medicine

## 2021-12-15 NOTE — Telephone Encounter (Signed)
Called pt - [id number] for appointment. 

## 2021-12-15 NOTE — Telephone Encounter (Signed)
Courtesy refill. Patient will need an office visit for further refills. Requested Prescriptions  Pending Prescriptions Disp Refills  . amLODipine (NORVASC) 5 MG tablet [Pharmacy Med Name: amLODIPine Besylate 5 MG Oral Tablet] 90 tablet 0    Sig: Take 1 tablet by mouth once daily     Cardiovascular: Calcium Channel Blockers 2 Failed - 12/12/2021  5:23 PM      Failed - Valid encounter within last 6 months    Recent Outpatient Visits          7 months ago Left lower quadrant abdominal pain   [place], Megan P, DO   10 months ago Need for influenza vaccination   [place], Megan P, DO   1 year ago Bulging lumbar disc   [place], [place], DO   1 year ago Perirectal fistula   [place], Megan P, DO   1 year ago Routine general medical examination at a health care facility   [facility], [place] P, DO             Passed - Last BP in normal range    BP Readings from Last 1 Encounters:  04/30/21 130/79         Passed - Last Heart Rate in normal range    Pulse Readings from Last 1 Encounters:  04/30/21 81

## 2021-12-17 DIAGNOSIS — L509 Urticaria, unspecified: Secondary | ICD-10-CM | POA: Diagnosis not present

## 2021-12-17 DIAGNOSIS — Z6830 Body mass index (BMI) 30.0-30.9, adult: Secondary | ICD-10-CM | POA: Diagnosis not present

## 2021-12-17 DIAGNOSIS — I10 Essential (primary) hypertension: Secondary | ICD-10-CM | POA: Diagnosis not present

## 2021-12-17 DIAGNOSIS — R7989 Other specified abnormal findings of blood chemistry: Secondary | ICD-10-CM | POA: Diagnosis not present

## 2021-12-18 DIAGNOSIS — T887XXA Unspecified adverse effect of drug or medicament, initial encounter: Secondary | ICD-10-CM | POA: Diagnosis not present

## 2021-12-18 DIAGNOSIS — R7989 Other specified abnormal findings of blood chemistry: Secondary | ICD-10-CM | POA: Diagnosis not present

## 2022-01-21 DIAGNOSIS — B349 Viral infection, unspecified: Secondary | ICD-10-CM | POA: Diagnosis not present

## 2022-01-21 DIAGNOSIS — S161XXS Strain of muscle, fascia and tendon at neck level, sequela: Secondary | ICD-10-CM | POA: Diagnosis not present

## 2022-01-21 DIAGNOSIS — G4733 Obstructive sleep apnea (adult) (pediatric): Secondary | ICD-10-CM | POA: Diagnosis not present

## 2022-01-22 ENCOUNTER — Ambulatory Visit
Admission: RE | Admit: 2022-01-22 | Discharge: 2022-01-22 | Disposition: A | Discharge status: Home / Self Care | Payer: BC Managed Care – PPO | Source: Ambulatory Visit | Attending: Physician Assistant | Admitting: Physician Assistant

## 2022-01-22 VITALS — BP 138/90 | HR 59 | Temp 98.6°F | Resp 18

## 2022-01-22 DIAGNOSIS — Z20822 Contact with and (suspected) exposure to covid-19: Secondary | ICD-10-CM | POA: Diagnosis not present

## 2022-01-22 DIAGNOSIS — M542 Cervicalgia: Secondary | ICD-10-CM | POA: Insufficient documentation

## 2022-01-22 DIAGNOSIS — R5383 Other fatigue: Secondary | ICD-10-CM | POA: Insufficient documentation

## 2022-01-22 DIAGNOSIS — M791 Myalgia, unspecified site: Secondary | ICD-10-CM | POA: Diagnosis not present

## 2022-01-22 LAB — RESP PANEL BY RT-PCR (FLU A&B, COVID) ARPGX2
Influenza A by PCR: NEGATIVE
Influenza B by PCR: NEGATIVE
SARS Coronavirus 2 by RT PCR: NEGATIVE

## 2022-01-22 NOTE — Discharge Instructions (Signed)
-  We will call you with the flu test is positive.  Your COVID test is negative. - Vital signs are all stable.  Your exam is reassuring.  He has negative meningitis test but if the neck pain worsens or you range of motion decreases more, you have a fever, you should go to the ER. - As we discussed, your symptoms may be consistent with a virus or could be related to being overworked recently.  I would encourage you to continue ibuprofen and Tylenol.  You may take 600 mg ibuprofen every 6 hours for pain relief and 1 g of Tylenol every 8 hours as needed for pain relief.  Increase your fluids and electrolytes. - If you develop any new symptoms such as severe headache, dizziness, feeling faint, chest pain, shortness of breath, abdominal pain, vomiting, weakness of muscles, decreased urine output, please call 911 or have someone to you to the ER for reevaluation.

## 2022-01-22 NOTE — ED Triage Notes (Signed)
Pt woke up 2 days ago with neck pain and upper back pain into his shoulders. He also has a HA, fatigue, night sweats, and some jaw tightness. Pt denies any injury and he took 2 covid test at home that was negative.

## 2022-01-22 NOTE — ED Provider Notes (Signed)
[facility]    CSN: [id number] Arrival date & time: 01/22/22  1121      History   Chief Complaint Chief Complaint  Patient presents with   Back Pain    Severe Neck pain and stiffness, body aches, fatigue and brain fog. - Entered by patient   Neck Pain    HPI John Huang is a 46 y.o. male presenting for diffuse body aches, fatigue, headaches or night sweats.  Symptoms started 3 days ago.  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  He denies fever, cough, congestion, sinus pain, sore throat, chest pain, shortness of breath, abdominal pain, nausea/vomiting or diarrhea.  No urinary symptoms.  He has been taking ibuprofen and Tylenol for the aches and pains and says that does not even help.  He reports no significant pain in his neck.  States that it feels very stiff.  No similar problems in the past.  No other complaints.  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    History of septic arthritis 2000   L knee   Hypertension    controlled   PONV (postoperative nausea and vomiting)    Seasonal allergies    Sleep apnea    CPAP in past then throat surgery, no CPAP   Vertigo    Hx of    Patient Active Problem List   Diagnosis Date Noted   Abnormal movement of jaw 04/24/2021   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   Gastritis without bleeding    Hematochezia    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   Chronic night sweats 08/10/2019   Polyarthralgia 08/10/2019   Primary osteoarthritis 08/10/2019   Anxiety 10/17/2018   OSA (obstructive sleep apnea) 02/28/2018    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   Hypertension    Bulging lumbar disc    Deviated nasal septum 05/24/2017   Nasal vestibulitis 05/24/2017   GERD (gastroesophageal reflux disease) 02/18/2016    Past Surgical History:  Procedure Laterality Date   ANTERIOR CRUCIATE LIGAMENT REPAIR     COLONOSCOPY WITH PROPOFOL N/A 08/16/2020   Procedure: COLONOSCOPY WITH PROPOFOL;  Surgeon: Lucilla Lame, MD;  Location: [place];  Service: Endoscopy;  Laterality: N/A;   ELBOW SURGERY Left 06/20/2020   ESOPHAGOGASTRODUODENOSCOPY (EGD) WITH PROPOFOL N/A 08/16/2020   Procedure: ESOPHAGOGASTRODUODENOSCOPY (EGD) WITH PROPOFOL;  Surgeon: Lucilla Lame, MD;  Location: [place];  Service: Endoscopy;  Laterality: N/A;   FOOT SURGERY  2020   MEDIAL COLLATERAL LIGAMENT AND LATERAL COLLATERAL LIGAMENT REPAIR, KNEE     SHOULDER SURGERY     dislocation and tac left in there, so had to have surgery to removed the tac   TONSILLECTOMY  2020   [place] Medications    Prior to Admission medications   Medication Sig Start Date End Date Taking? Authorizing Provider  amLODipine (NORVASC) 5 MG tablet Take 1 tablet by mouth once daily 12/15/21  Yes Johnson, Megan P, DO  citalopram (CELEXA) 40 MG tablet  TAKE 1 TABLET BY MOUTH DAILY. 04/30/21 04/30/22 Yes Johnson, Megan P, DO  fexofenadine (ALLEGRA) 180 MG tablet Take 1-2 tablets (180-360 mg total) by mouth daily. 09/26/20  Yes Johnson, Megan P, DO  Multiple Vitamin (MULTI-VITAMINS) TABS Take by mouth.   Yes [provider]  omeprazole (PRILOSEC) 40 MG capsule Take 1 capsule (40 mg total) by mouth daily. 02/06/21  Yes Johnson, Megan P, DO  clotrimazole-betamethasone (LOTRISONE) cream Apply 1 application topically daily. 01/28/21   McElwee, Lauren A, NP  finasteride (PROPECIA) 1 MG tablet Take 1 mg by mouth daily. am    [provider]  gabapentin (NEURONTIN) 300 MG capsule Take 1 capsule (300 mg  total) by mouth 3 (three) times daily. 04/24/21   Johnson, Megan P, DO  naproxen (NAPROSYN) 500 MG tablet Take 1 tablet (500 mg total) by mouth 2 (two) times daily with a meal. 04/22/21   Edrick Kins, DPM  nystatin-triamcinolone (MYCOLOG II) cream Apply topically 4 (four) times daily. 03/25/21   [provider]  traZODone (DESYREL) 50 MG tablet TAKE 1/2 TO 1 (ONE-HALF TO ONE) TABLET BY MOUTH AT BEDTIME AS NEEDED FOR SLEEP 10/24/21   Valerie Roys, DO    Family History Family History  Problem Relation Age of Onset   Stroke Mother    Hypertension Mother    Diabetes Mother    Cancer Father        Throat   Cancer Sister        Eye/Brain   Hypertension Brother    Irritable bowel syndrome Brother     Social History Social History   Tobacco Use   Smoking status: Never   Smokeless tobacco: Never  Vaping Use   Vaping Use: Never used  Substance Use Topics   Alcohol use: Not Currently   Drug use: Never     Allergies   Cat hair extract, Other, Alpha-gal, and Galactose   Review of Systems Review of Systems  Constitutional:  Positive for diaphoresis and fatigue. Negative for chills and fever.  HENT:  Negative for congestion, rhinorrhea, sinus pain and sore throat.   Respiratory:  Negative for cough and shortness of breath.   Cardiovascular:  Negative for chest pain.  Gastrointestinal:  Negative for abdominal pain, diarrhea, nausea and vomiting.  Genitourinary:  Negative for decreased urine volume, dysuria, frequency and urgency.  Musculoskeletal:  Positive for back pain, myalgias and neck pain.  Neurological:  Positive for headaches. Negative for dizziness, syncope, weakness and light-headedness.  Hematological:  Negative for adenopathy.     Physical Exam Triage Vital Signs ED Triage Vitals [01/22/22 1132]  Enc Vitals Group     BP      Pulse      Resp      Temp      Temp src      SpO2      Weight      Height      Head Circumference      Peak Flow       Pain Score 8     Pain Loc      Pain Edu?      Excl. in [place]?    No data found.  Updated Vital Signs BP (!) 138/90 (BP Location: Right Arm)   Pulse (!) 59   Temp 98.6 F (37 C) (Oral)   Resp 18   SpO2 97%   Physical Exam Vitals and nursing note reviewed.  Constitutional:      General: He  is not in acute distress.    Appearance: Normal appearance. He is well-developed. He is ill-appearing (appears tired and like he is feeling unwell, non toxic).  HENT:     Head: Normocephalic and atraumatic.     Nose: Nose normal.     Mouth/Throat:     Mouth: Mucous membranes are moist.     Pharynx: Oropharynx is clear.  Eyes:     General: No scleral icterus.    Extraocular Movements: Extraocular movements intact.     Conjunctiva/sclera: Conjunctivae normal.     Pupils: Pupils are equal, round, and reactive to light.  Cardiovascular:     Rate and Rhythm: Normal rate and regular rhythm.     Heart sounds: Normal heart sounds.  Pulmonary:     Effort: Pulmonary effort is normal. No respiratory distress.     Breath sounds: Normal breath sounds. No wheezing, rhonchi or rales.  Abdominal:     Palpations: Abdomen is soft.     Tenderness: There is no abdominal tenderness. There is no right CVA tenderness or left CVA tenderness.  Musculoskeletal:     Cervical back: Neck supple. Tenderness present. No rigidity.     Comments: Reduced ROM of neck mildly, movement pain of neck  Skin:    General: Skin is warm and dry.     Capillary Refill: Capillary refill takes less than 2 seconds.  Neurological:     General: No focal deficit present.     Mental Status: He is alert. Mental status is at baseline.     Motor: No weakness.     Coordination: Coordination normal.     Gait: Gait normal.     Comments: Negative meningeal testing  Psychiatric:        Mood and Affect: Mood normal.        Behavior: Behavior normal.      UC Treatments / Results  Labs (all labs ordered are listed, but only abnormal  results are displayed) Labs Reviewed  RESP PANEL BY RT-PCR (FLU A&B, COVID) [id number]    EKG   Radiology No results found.  Procedures Procedures (including critical care time)  Medications Ordered in UC Medications - No data to display  Initial Impression / Assessment and Plan / UC Course  I have reviewed the triage vital signs and the nursing notes.  Pertinent labs & imaging results that were available during my care of the patient were reviewed by me and considered in my medical decision making (see chart for details).   46 year old male presenting for 2 to 3-day history of diffuse body aches, fatigue, night sweats.  Denies fever, sore throat, cough, congestion, breathing faulty, abdominal pain, nausea/vomiting or diarrhea.  Has been exposed to COVID-19 at work.  He has taken 2 home test and they have been negative.  Vitals are stable.  He is ill-appearing but nontoxic.  On exam he has tenderness to palpation of all muscle groups and especially neck.  Reduced range of motion of neck..  Full strength.  Respiratory panel obtained.  Negative flu and COVID testing.  Advised patient his symptoms may be due to another virus or it could potentially be related to him being overworked recently.  He does state that he had to work a lot more hours and was shorthanded at work recently.  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  At this time, advised him to rest and increase his fluid intake.  We discussed increasing his doses of ibuprofen and Tylenol  and closely monitoring his symptoms.  Thoroughly reviewed ED precautions with patient and advised him to follow-up in the ED if he develops any new/acute or worsening symptoms that could suggest another cause for his symptoms.   Final Clinical Impressions(s) / UC Diagnoses   Final diagnoses:  Myalgia  Neck pain  Other fatigue     Discharge Instructions      -We will call you with the flu test is positive.  Your COVID test is  negative. - Vital signs are all stable.  Your exam is reassuring.  He has negative meningitis test but if the neck pain worsens or you range of motion decreases more, you have a fever, you should go to the ER. - As we discussed, your symptoms may be consistent with a virus or could be related to being overworked recently.  I would encourage you to continue ibuprofen and Tylenol.  You may take 600 mg ibuprofen every 6 hours for pain relief and 1 g of Tylenol every 8 hours as needed for pain relief.  Increase your fluids and electrolytes. - If you develop any new symptoms such as severe headache, dizziness, feeling faint, chest pain, shortness of breath, abdominal pain, vomiting, weakness of muscles, decreased urine output, please call 911 or have someone to you to the ER for reevaluation.     ED Prescriptions   None    PDMP not reviewed this encounter.   Danton Clap, PA-C 01/22/22 1504

## 2022-01-29 DIAGNOSIS — R7989 Other specified abnormal findings of blood chemistry: Secondary | ICD-10-CM | POA: Diagnosis not present

## 2022-02-02 ENCOUNTER — Ambulatory Visit: Payer: BC Managed Care – PPO | Admitting: Family Medicine

## 2022-02-02 ENCOUNTER — Encounter: Payer: Self-pay | Admitting: Family Medicine

## 2022-02-02 VITALS — BP 134/88 | HR 62 | Temp 98.3°F | Wt 223.4 lb

## 2022-02-02 DIAGNOSIS — Z125 Encounter for screening for malignant neoplasm of prostate: Secondary | ICD-10-CM | POA: Diagnosis not present

## 2022-02-02 DIAGNOSIS — M7711 Lateral epicondylitis, right elbow: Secondary | ICD-10-CM | POA: Diagnosis not present

## 2022-02-02 DIAGNOSIS — Z13220 Encounter for screening for lipoid disorders: Secondary | ICD-10-CM | POA: Diagnosis not present

## 2022-02-02 DIAGNOSIS — K219 Gastro-esophageal reflux disease without esophagitis: Secondary | ICD-10-CM

## 2022-02-02 DIAGNOSIS — I10 Essential (primary) hypertension: Secondary | ICD-10-CM

## 2022-02-02 DIAGNOSIS — F419 Anxiety disorder, unspecified: Secondary | ICD-10-CM

## 2022-02-02 LAB — MICROALBUMIN, URINE WAIVED
Creatinine, Urine Waived: 100 mg/dL (ref 10–300)
Microalb, Ur Waived: 10 mg/L (ref 0–19)
Microalb/Creat Ratio: <30 mg/g (ref <30)

## 2022-02-02 LAB — URINALYSIS, ROUTINE W REFLEX MICROSCOPIC
Bilirubin, UA: NEGATIVE
Glucose, UA: NEGATIVE
Ketones, UA: NEGATIVE
Leukocytes,UA: NEGATIVE
Nitrite, UA: NEGATIVE
Protein,UA: NEGATIVE
RBC, UA: NEGATIVE
Specific Gravity, UA: 1.025 (ref 1.005–1.030)
Urobilinogen, Ur: 0.2 mg/dL (ref 0.2–1.0)
pH, UA: 6 (ref 5.0–7.5)

## 2022-02-02 MED ORDER — AMLODIPINE BESYLATE 2.5 MG PO TABS: 2.5000 mg | ORAL_TABLET | Freq: Every day | ORAL | 1 refills | Status: DC

## 2022-02-02 MED ORDER — OMEPRAZOLE 40 MG PO CPDR: 40.0000 mg | DELAYED_RELEASE_CAPSULE | Freq: Every day | ORAL | 1 refills | Status: DC

## 2022-02-02 MED ORDER — CITALOPRAM HYDROBROMIDE 40 MG PO TABS: ORAL_TABLET | Freq: Every day | ORAL | 0 refills | Status: DC

## 2022-02-02 MED ORDER — TRAZODONE HCL 50 MG PO TABS: ORAL_TABLET | ORAL | 1 refills | Status: DC

## 2022-02-02 NOTE — Assessment & Plan Note (Signed)
BP OK but has been off his medicine for about a week. Will restart amlodipine at 2.'5mg'$  and recheck in about a month. Call with any concerns.

## 2022-02-02 NOTE — Assessment & Plan Note (Signed)
Under good control on current regimen. Continue current regimen. Continue to monitor. Call with any concerns. Refills given. Labs drawn today.   

## 2022-02-02 NOTE — Assessment & Plan Note (Signed)
Under good control on current regimen. Continue current regimen. Continue to monitor. Call with any concerns. Refills given.   

## 2022-02-02 NOTE — Progress Notes (Signed)
BP 134/88   Pulse 62   Temp 98.3 F (36.8 C)   Wt 223 lb 6.4 oz (101.3 kg)   SpO2 97%   BMI 28.68 kg/m    Subjective:    Patient ID: John Huang, male    DOB: 05-Nov-1975, 46 y.o.   MRN: [id number]  HPI: HARJOT DIBELLO is a 46 y.o. male  Chief Complaint  Patient presents with   Hypertension    Per patient has been out of medication for one week.    Fatigue    Patient states he was seen at an urgent care for neck pain and stiffness, and fatigue. Was told it was just a virus, feels better today.    Joint Pain    Patient states he gets episodes of joint pain, has been having right elbow pain for about a month and a half. Patient states he gets episodes every few weeks where he gets a pain in one joint.    Insomnia    Patient states he is out of trazadone and needs a refill, states he takes medication because he has bad night sweats and can't sleep.    HYPERTENSION  Hypertension status: stable  Satisfied with current treatment? yes Duration of hypertension: chronic BP monitoring frequency:  not checking BP medication side effects:  no Medication compliance:  has been off his medicine for about a week Previous BP meds:amlodipine Aspirin: no Recurrent headaches: no Visual changes: no Palpitations: no Dyspnea: no Chest pain: no Lower extremity edema: no Dizzy/lightheaded: no  ARM PAIN Duration: about a month again Location: right elbow Mechanism of injury: unknown Onset: gradual Severity: moderate  Quality:  aching and shooting Frequency: intermittent Radiation: yes Aggravating factors: movement  Alleviating factors: ice, physical therapy, HEP, APAP, and NSAIDs  Status: stable Treatments attempted: rest, ice, heat, APAP, ibuprofen, aleve, physical therapy, and HEP  Relief with NSAIDs?:  mild Swelling: no Redness: no  Warmth: no Trauma: no Chest pain: no  Shortness of breath: no  Fever: no Decreased sensation: no Paresthesias: no Weakness:  no  DEPRESSION Mood status: controlled Satisfied with current treatment?: yes Symptom severity: mild  Duration of current treatment : chronic Side effects: no Medication compliance: excellent compliance Psychotherapy/counseling: no  Previous psychiatric medications: celexa Depressed mood: no Anxious mood: no Anhedonia: no Significant weight loss or gain: no Insomnia: no  Fatigue: yes Feelings of worthlessness or guilt: no Impaired concentration/indecisiveness: no Suicidal ideations: no Hopelessness: no Crying spells: no    02/02/2022   10:28 AM 04/24/2021   10:17 AM 02/06/2021   11:13 AM 09/26/2020   10:18 AM 03/21/2020   11:12 AM  Depression screen PHQ 2/9  Decreased Interest 0 0 0 0 0  Down, Depressed, Hopeless 0 0 0 0 0  PHQ - 2 Score 0 0 0 0 0  Altered sleeping 0 0 3  0  Tired, decreased energy 0 0 0  0  Change in appetite 0 0 0  0  Feeling bad or failure about yourself  0 0 0  0  Trouble concentrating 0 0 0  0  Moving slowly or fidgety/restless 0 0 0  0  Suicidal thoughts 0 0 0  0  PHQ-9 Score 0 0 3  0  Difficult doing work/chores Not difficult at all  Not difficult at all  Not difficult at all     Relevant past medical, surgical, family and social history reviewed and updated as indicated. Interim medical history since our last visit  reviewed. Allergies and medications reviewed and updated.  Review of Systems  Constitutional: Negative.   Respiratory: Negative.    Cardiovascular: Negative.   Gastrointestinal: Negative.   Musculoskeletal:  Positive for arthralgias and myalgias. Negative for back pain, gait problem, joint swelling, neck pain and neck stiffness.  Skin: Negative.   Neurological: Negative.   Psychiatric/Behavioral: Negative.      Per HPI unless specifically indicated above     Objective:    BP 134/88   Pulse 62   Temp 98.3 F (36.8 C)   Wt 223 lb 6.4 oz (101.3 kg)   SpO2 97%   BMI 28.68 kg/m   Wt Readings from Last 3 Encounters:   02/02/22 223 lb 6.4 oz (101.3 kg)  04/30/21 238 lb (108 kg)  04/24/21 235 lb 12.8 oz (107 kg)    Physical Exam Vitals and nursing note reviewed.  Constitutional:      General: He is not in acute distress.    Appearance: Normal appearance. He is not ill-appearing, toxic-appearing or diaphoretic.  HENT:     Head: Normocephalic and atraumatic.     Right Ear: External ear normal.     Left Ear: External ear normal.     Nose: Nose normal.     Mouth/Throat:     Mouth: Mucous membranes are moist.     Pharynx: Oropharynx is clear.  Eyes:     General: No scleral icterus.       Right eye: No discharge.        Left eye: No discharge.     Extraocular Movements: Extraocular movements intact.     Conjunctiva/sclera: Conjunctivae normal.     Pupils: Pupils are equal, round, and reactive to light.  Cardiovascular:     Rate and Rhythm: Normal rate and regular rhythm.     Pulses: Normal pulses.     Heart sounds: Normal heart sounds. No murmur heard.    No friction rub. No gallop.  Pulmonary:     Effort: Pulmonary effort is normal. No respiratory distress.     Breath sounds: Normal breath sounds. No stridor. No wheezing, rhonchi or rales.  Chest:     Chest wall: No tenderness.  Musculoskeletal:        General: Tenderness (R lateral epicondyle tenderness) present. Normal range of motion.     Cervical back: Normal range of motion and neck supple.  Skin:    General: Skin is warm and dry.     Capillary Refill: Capillary refill takes less than 2 seconds.     Coloration: Skin is not jaundiced or pale.     Findings: No bruising, erythema, lesion or rash.  Neurological:     General: No focal deficit present.     Mental Status: He is alert and oriented to person, place, and time. Mental status is at baseline.  Psychiatric:        Mood and Affect: Mood normal.        Behavior: Behavior normal.        Thought Content: Thought content normal.        Judgment: Judgment normal.     Results for  orders placed or performed in visit on 02/02/22  Urinalysis, Routine w reflex microscopic  Result Value Ref Range   Specific Gravity, UA 1.025 1.005 - 1.030   pH, UA 6.0 5.0 - 7.5   Color, UA Yellow Yellow   Appearance Ur Clear Clear   Leukocytes,UA Negative Negative   Protein,UA Negative Negative/Trace  Glucose, UA Negative Negative   Ketones, UA Negative Negative   RBC, UA Negative Negative   Bilirubin, UA Negative Negative   Urobilinogen, Ur 0.2 0.2 - 1.0 mg/dL   Nitrite, UA Negative Negative  Microalbumin, Urine Waived  Result Value Ref Range   Microalb, Ur Waived 10 0 - 19 mg/L   Creatinine, Urine Waived 100 10 - 300 mg/dL   Microalb/Creat Ratio <30 <30 mg/g      Assessment & Plan:   Problem List Items Addressed This Visit       Cardiovascular and Mediastinum   Hypertension - Primary    BP OK but has been off his medicine for about a week. Will restart amlodipine at 2.'5mg'$  and recheck in about a month. Call with any concerns.       Relevant Medications   amLODipine (NORVASC) 2.5 MG tablet   Other Relevant Orders   Comprehensive metabolic panel   CBC with Differential/Platelet   TSH   Urinalysis, Routine w reflex microscopic (Completed)   Microalbumin, Urine Waived (Completed)     Digestive   GERD (gastroesophageal reflux disease)    Under good control on current regimen. Continue current regimen. Continue to monitor. Call with any concerns. Refills given. Labs drawn today.       Relevant Medications   omeprazole (PRILOSEC) 40 MG capsule     Other   Anxiety    Under good control on current regimen. Continue current regimen. Continue to monitor. Call with any concerns. Refills given.        Relevant Medications   traZODone (DESYREL) 50 MG tablet   citalopram (CELEXA) 40 MG tablet   Other Visit Diagnoses     Lateral epicondylitis of right elbow       Injection given today as below. Continue to monitor.    Screening for cholesterol level       Labs  drawn today. Await results. Treat as needed.    Relevant Orders   Lipid Panel w/o Chol/HDL Ratio   Screening for prostate cancer       Labs drawn today. Await results. Treat as needed.    Relevant Orders   PSA       Procedure: Right Lateral Epicondylitis Steroid Injection        Diagnosis:   ICD-10-CM   1. Primary hypertension  I10 Comprehensive metabolic panel    CBC with Differential/Platelet    TSH    Urinalysis, Routine w reflex microscopic    Microalbumin, Urine Waived    2. Lateral epicondylitis of right elbow  M77.11    Injection given today as below. Continue to monitor.     3. Gastroesophageal reflux disease, unspecified whether esophagitis present  K21.9     4. Anxiety  F41.9     5. Screening for cholesterol level  Z13.220 Lipid Panel w/o Chol/HDL Ratio   Labs drawn today. Await results. Treat as needed.     6. Screening for prostate cancer  Z12.5 PSA   Labs drawn today. Await results. Treat as needed.       Physician: MJ Consent:  Risks, benefits, and alternative treatments discussed and all questions were answered.  Patient elected to proceed and verbal consent obtained.  Description: Area prepped and draped using  semi-sterile technique.  After identifying area of maximal tenderness, A mixture of 1cc 1% lidocaine and 0.5cc Kenalog 40 was injected.  A bandage was then placed over the injection site. Complications: none Post Procedure Instructions: Wound care instructions discussed and  patient was instructed to keep area clean and dry.  Signs and symptoms of infection discussed, patient agrees to contact the office ASAP should they occur.  Follow Up: Return in about 4 weeks (around 03/02/2022), or physical.  Follow up plan: Return in about 4 weeks (around 03/02/2022), or physical.

## 2022-02-03 LAB — CBC WITH DIFFERENTIAL/PLATELET
Basophils Absolute: 0 10*3/uL (ref 0.0–0.2)
Basos: 1 %
EOS (ABSOLUTE): 0.1 10*3/uL (ref 0.0–0.4)
Eos: 3 %
Hematocrit: 45.8 % (ref 37.5–51.0)
Hemoglobin: 14.8 g/dL (ref 13.0–17.7)
Immature Grans (Abs): 0 10*3/uL (ref 0.0–0.1)
Immature Granulocytes: 0 %
Lymphocytes Absolute: 1.9 10*3/uL (ref 0.7–3.1)
Lymphs: 43 %
MCH: 29.1 pg (ref 26.6–33.0)
MCHC: 32.3 g/dL (ref 31.5–35.7)
MCV: 90 fL (ref 79–97)
Monocytes Absolute: 0.3 10*3/uL (ref 0.1–0.9)
Monocytes: 8 %
Neutrophils Absolute: 2 10*3/uL (ref 1.4–7.0)
Neutrophils: 45 %
Platelets: 227 10*3/uL (ref 150–450)
RBC: 5.09 x10E6/uL (ref 4.14–5.80)
RDW: 14.1 % (ref 11.6–15.4)
WBC: 4.5 10*3/uL (ref 3.4–10.8)

## 2022-02-03 LAB — COMPREHENSIVE METABOLIC PANEL
ALT: 19 IU/L (ref 0–44)
AST: 25 IU/L (ref 0–40)
Albumin/Globulin Ratio: 2 (ref 1.2–2.2)
Albumin: 4.9 g/dL (ref 4.1–5.1)
Alkaline Phosphatase: 62 IU/L (ref 44–121)
BUN/Creatinine Ratio: 17 (ref 9–20)
BUN: 18 mg/dL (ref 6–24)
Bilirubin Total: 0.3 mg/dL (ref 0.0–1.2)
CO2: 22 mmol/L (ref 20–29)
Calcium: 10 mg/dL (ref 8.7–10.2)
Chloride: 103 mmol/L (ref 96–106)
Creatinine, Ser: 1.03 mg/dL (ref 0.76–1.27)
Globulin, Total: 2.5 g/dL (ref 1.5–4.5)
Glucose: 65 mg/dL — ABNORMAL LOW (ref 70–99)
Potassium: 4.3 mmol/L (ref 3.5–5.2)
Sodium: 141 mmol/L (ref 134–144)
Total Protein: 7.4 g/dL (ref 6.0–8.5)
eGFR: 91 mL/min/{1.73_m2} (ref >59)

## 2022-02-03 LAB — LIPID PANEL W/O CHOL/HDL RATIO
Cholesterol, Total: 157 mg/dL (ref 100–199)
HDL: 38 mg/dL — ABNORMAL LOW (ref >39)
LDL Chol Calc (NIH): 104 mg/dL — ABNORMAL HIGH (ref 0–99)
Triglycerides: 75 mg/dL (ref 0–149)
VLDL Cholesterol Cal: 15 mg/dL (ref 5–40)

## 2022-02-03 LAB — TSH: TSH: 1.29 u[IU]/mL (ref 0.450–4.500)

## 2022-02-03 LAB — PSA: Prostate Specific Ag, Serum: 1.8 ng/mL (ref 0.0–4.0)

## 2022-02-06 DIAGNOSIS — Z23 Encounter for immunization: Secondary | ICD-10-CM | POA: Diagnosis not present

## 2022-03-02 ENCOUNTER — Encounter: Payer: Self-pay | Admitting: Family Medicine

## 2022-03-02 ENCOUNTER — Ambulatory Visit: Payer: BC Managed Care – PPO | Admitting: Family Medicine

## 2022-03-02 VITALS — BP 152/99 | HR 50 | Temp 97.9°F | Ht 72.75 in | Wt 221.1 lb

## 2022-03-02 DIAGNOSIS — Z0000 Encounter for general adult medical examination without abnormal findings: Secondary | ICD-10-CM | POA: Diagnosis not present

## 2022-03-02 DIAGNOSIS — F419 Anxiety disorder, unspecified: Secondary | ICD-10-CM | POA: Diagnosis not present

## 2022-03-02 DIAGNOSIS — I10 Essential (primary) hypertension: Secondary | ICD-10-CM

## 2022-03-02 DIAGNOSIS — Z136 Encounter for screening for cardiovascular disorders: Secondary | ICD-10-CM | POA: Diagnosis not present

## 2022-03-02 LAB — URINALYSIS, ROUTINE W REFLEX MICROSCOPIC
Bilirubin, UA: NEGATIVE
Glucose, UA: NEGATIVE
Ketones, UA: NEGATIVE
Leukocytes,UA: NEGATIVE
Nitrite, UA: NEGATIVE
Protein,UA: NEGATIVE
RBC, UA: NEGATIVE
Specific Gravity, UA: 1.02 (ref 1.005–1.030)
Urobilinogen, Ur: 0.2 mg/dL (ref 0.2–1.0)
pH, UA: 7 (ref 5.0–7.5)

## 2022-03-02 LAB — MICROALBUMIN, URINE WAIVED
Creatinine, Urine Waived: 100 mg/dL (ref 10–300)
Microalb, Ur Waived: 10 mg/L (ref 0–19)
Microalb/Creat Ratio: <30 mg/g (ref <30)

## 2022-03-02 MED ORDER — DULOXETINE HCL 20 MG PO CPEP: 40.0000 mg | ORAL_CAPSULE | Freq: Every day | ORAL | 3 refills | Status: DC

## 2022-03-02 MED ORDER — AMLODIPINE BESYLATE 5 MG PO TABS: 5.0000 mg | ORAL_TABLET | Freq: Every day | ORAL | 3 refills | Status: DC

## 2022-03-02 NOTE — Progress Notes (Signed)
BP (!) 152/99   Pulse (!) 50   Temp 97.9 F (36.6 C)   Ht 6' 0.75" (1.848 m)   Wt 221 lb 1.6 oz (100.3 kg)   SpO2 99%   BMI 29.37 kg/m    Subjective:    Patient ID: John Huang, male    DOB: [birth year], 46 y.o.   MRN: [id number]  HPI: John Huang is a 46 y.o. male presenting on 03/02/2022 for comprehensive medical examination. Current medical complaints include:  HYPERTENSION Hypertension status: uncontrolled  Satisfied with current treatment? no Duration of hypertension: chronic BP monitoring frequency:  not checking BP medication side effects:  no Medication compliance: excellent compliance Previous BP meds:amlodipine Aspirin: no Recurrent headaches: no Visual changes: no Palpitations: no Dyspnea: no Chest pain: no Lower extremity edema: no Dizzy/lightheaded: no  CHRONIC PAIN  Pain control status: uncontrolled Duration: chronic Location: widespread Quality: aching and sore Current Pain Level: moderate Previous Pain Level: mild Breakthrough pain: yes What Activities task can be accomplished with current medication? Able to work an do ADLs Interested in weaning off narcotics:N/A   Stool softners/OTC fiber: no  Previous pain specialty evaluation: yes Non-narcotic analgesic meds: yes Narcotic contract: no  He currently lives with: wife and daughters Interim Problems from his last visit: no  Depression Screen done today and results listed below:     03/02/2022   11:19 AM 02/02/2022   10:28 AM 04/24/2021   10:17 AM 02/06/2021   11:13 AM 09/26/2020   10:18 AM  Depression screen PHQ 2/9  Decreased Interest 0 0 0 0 0  Down, Depressed, Hopeless 0 0 0 0 0  PHQ - 2 Score 0 0 0 0 0  Altered sleeping 0 0 0 3   Tired, decreased energy 0 0 0 0   Change in appetite 0 0 0 0   Feeling bad or failure about yourself  0 0 0 0   Trouble concentrating 0 0 0 0   Moving slowly or fidgety/restless 0 0 0 0   Suicidal thoughts 0 0 0 0   PHQ-9 Score 0 0 0 3    Difficult doing work/chores  Not difficult at all  Not difficult at all     Past Medical History:  Past Medical History:  Diagnosis Date   Asthma    when 46 yrs old, years ago   Bulging lumbar disc    CAP (community acquired pneumonia) 02/18/2016   Cholinergic urticaria    Complication of anesthesia    Epistaxis 05/24/2017   GERD (gastroesophageal reflux disease)    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    History of septic arthritis 2000   L knee   Hypertension    controlled   PONV (postoperative nausea and vomiting)    Seasonal allergies    Sleep apnea    CPAP in past then throat surgery, no CPAP   Vertigo    Hx of    Surgical History:  Past Surgical History:  Procedure Laterality Date   ANTERIOR CRUCIATE LIGAMENT REPAIR     COLONOSCOPY WITH PROPOFOL N/A 08/16/2020   Procedure: COLONOSCOPY WITH PROPOFOL;  Surgeon: Lucilla Lame, MD;  Location: [facility];  Service: Endoscopy;  Laterality: N/A;   ELBOW SURGERY Left 06/20/2020   ESOPHAGOGASTRODUODENOSCOPY (EGD) WITH PROPOFOL N/A 08/16/2020   Procedure: ESOPHAGOGASTRODUODENOSCOPY (EGD) WITH PROPOFOL;  Surgeon: Lucilla Lame, MD;  Location: [facility];  Service: Endoscopy;  Laterality: N/A;   FOOT SURGERY  2020  MEDIAL COLLATERAL LIGAMENT AND LATERAL COLLATERAL LIGAMENT REPAIR, KNEE     SHOULDER SURGERY     dislocation and tac left in there, so had to have surgery to removed the tac   TONSILLECTOMY  2020   WISDOM TOOTH EXTRACTION      Medications:  Current Outpatient Medications on File Prior to Visit  Medication Sig   clotrimazole-betamethasone (LOTRISONE) cream Apply 1 application topically daily.   fexofenadine (ALLEGRA) 180 MG tablet Take 1-2 tablets (180-360 mg total) by mouth daily.   Multiple Vitamin (MULTI-VITAMINS) TABS Take by mouth.   omeprazole (PRILOSEC) 40 MG capsule Take 1 capsule (40 mg total) by mouth daily.   traZODone (DESYREL) 50 MG tablet TAKE 1/2 TO 1 (ONE-HALF TO ONE) TABLET BY  MOUTH AT BEDTIME AS NEEDED FOR SLEEP   No current facility-administered medications on file prior to visit.    Allergies:  Allergies  Allergen Reactions   Cat Hair Extract Hives and Other (See Comments)    Wheezing, eyes itch Wheezing, eyes itch Wheezing, eyes itch   Other Hives and Other (See Comments)    Wheezing, eyes itch   Alpha-Gal Hives   Galactose Other (See Comments)    Flu like symptoms after consuming mammalian products Flu like symptoms after consuming mammalian products Flu like symptoms after consuming mammalian products Flu like symptoms after consuming mammalian products    Social History:  Social History   Socioeconomic History   Marital status: Married    Spouse name: Holden Maniscalco   Number of children: 2   Years of education: Not on file   Highest education level: Not on file  Occupational History   Not on file  Tobacco Use   Smoking status: Never   Smokeless tobacco: Never  Vaping Use   Vaping Use: Never used  Substance and Sexual Activity   Alcohol use: Not Currently   Drug use: Never   Sexual activity: Yes    Birth control/protection: None  Other Topics Concern   Not on file  Social History Narrative   Right Handed    Lives in one story home    Social Determinants of Health   Financial Resource Strain: Not on file  Food Insecurity: Not on file  Transportation Needs: Not on file  Physical Activity: Not on file  Stress: Not on file  Social Connections: Not on file  Intimate Partner Violence: Not on file   Social History   Tobacco Use  Smoking Status Never  Smokeless Tobacco Never   Social History   Substance and Sexual Activity  Alcohol Use Not Currently    Family History:  Family History  Problem Relation Age of Onset   Stroke Mother    Hypertension Mother    Diabetes Mother    Cancer Father        Throat   Cancer Sister        Eye/Brain   Hypertension Brother    Irritable bowel syndrome Brother     Past medical  history, surgical history, medications, allergies, family history and social history reviewed with patient today and changes made to appropriate areas of the chart.   Review of Systems  Constitutional:  Positive for chills, diaphoresis and malaise/fatigue. Negative for fever and weight loss.  HENT:  Positive for ear pain. Negative for congestion, ear discharge, hearing loss, nosebleeds, sinus pain, sore throat and tinnitus.   Eyes: Negative.   Respiratory: Negative.  Negative for stridor.   Cardiovascular: Negative.   Gastrointestinal:  Positive  for constipation and heartburn. Negative for abdominal pain, blood in stool, diarrhea, melena, nausea and vomiting.  Genitourinary:  Positive for urgency. Negative for dysuria, flank pain, frequency and hematuria.       Wet the bed 3x  Musculoskeletal:  Positive for joint pain and myalgias. Negative for back pain, falls and neck pain.  Skin: Negative.   Neurological:  Positive for dizziness. Negative for tingling, tremors, sensory change, speech change, focal weakness, seizures, loss of consciousness, weakness and headaches.  Endo/Heme/Allergies: Negative.   Psychiatric/Behavioral:  Positive for depression. Negative for hallucinations, memory loss, substance abuse and suicidal ideas. The patient is nervous/anxious. The patient does not have insomnia.    All other ROS negative except what is listed above and in the HPI.      Objective:    BP (!) 152/99   Pulse (!) 50   Temp 97.9 F (36.6 C)   Ht 6' 0.75" (1.848 m)   Wt 221 lb 1.6 oz (100.3 kg)   SpO2 99%   BMI 29.37 kg/m   Wt Readings from Last 3 Encounters:  03/02/22 221 lb 1.6 oz (100.3 kg)  02/02/22 223 lb 6.4 oz (101.3 kg)  04/30/21 238 lb (108 kg)    Physical Exam Vitals and nursing note reviewed.  Constitutional:      General: He is not in acute distress.    Appearance: Normal appearance. He is normal weight. He is not ill-appearing, toxic-appearing or diaphoretic.  HENT:      Head: Normocephalic and atraumatic.     Right Ear: Tympanic membrane, ear canal and external ear normal. There is no impacted cerumen.     Left Ear: Tympanic membrane, ear canal and external ear normal. There is no impacted cerumen.     Nose: Nose normal. No congestion or rhinorrhea.     Mouth/Throat:     Mouth: Mucous membranes are moist.     Pharynx: Oropharynx is clear. No oropharyngeal exudate or posterior oropharyngeal erythema.  Eyes:     General: No scleral icterus.       Right eye: No discharge.        Left eye: No discharge.     Extraocular Movements: Extraocular movements intact.     Conjunctiva/sclera: Conjunctivae normal.     Pupils: Pupils are equal, round, and reactive to light.  Neck:     Vascular: No carotid bruit.  Cardiovascular:     Rate and Rhythm: Normal rate and regular rhythm.     Pulses: Normal pulses.     Heart sounds: No murmur heard.    No friction rub. No gallop.  Pulmonary:     Effort: Pulmonary effort is normal. No respiratory distress.     Breath sounds: Normal breath sounds. No stridor. No wheezing, rhonchi or rales.  Chest:     Chest wall: No tenderness.  Abdominal:     General: Abdomen is flat. Bowel sounds are normal. There is no distension.     Palpations: Abdomen is soft. There is no mass.     Tenderness: There is no abdominal tenderness. There is no right CVA tenderness, left CVA tenderness, guarding or rebound.     Hernia: No hernia is present.  Genitourinary:    Comments: Genital exam deferred with shared decision making Musculoskeletal:        General: No swelling, tenderness, deformity or signs of injury.     Cervical back: Normal range of motion and neck supple. No rigidity. No muscular tenderness.  Right lower leg: No edema.     Left lower leg: No edema.  Lymphadenopathy:     Cervical: No cervical adenopathy.  Skin:    General: Skin is warm and dry.     Capillary Refill: Capillary refill takes less than 2 seconds.      Coloration: Skin is not jaundiced or pale.     Findings: No bruising, erythema, lesion or rash.  Neurological:     General: No focal deficit present.     Mental Status: He is alert and oriented to person, place, and time.     Cranial Nerves: No cranial nerve deficit.     Sensory: No sensory deficit.     Motor: No weakness.     Coordination: Coordination normal.     Gait: Gait normal.     Deep Tendon Reflexes: Reflexes normal.  Psychiatric:        Mood and Affect: Mood normal.        Behavior: Behavior normal.        Thought Content: Thought content normal.        Judgment: Judgment normal.     Results for orders placed or performed in visit on 03/02/22  Urinalysis, Routine w reflex microscopic  Result Value Ref Range   Specific Gravity, UA 1.020 1.005 - 1.030   pH, UA 7.0 5.0 - 7.5   Color, UA Yellow Yellow   Appearance Ur Clear Clear   Leukocytes,UA Negative Negative   Protein,UA Negative Negative/Trace   Glucose, UA Negative Negative   Ketones, UA Negative Negative   RBC, UA Negative Negative   Bilirubin, UA Negative Negative   Urobilinogen, Ur 0.2 0.2 - 1.0 mg/dL   Nitrite, UA Negative Negative  Microalbumin, Urine Waived  Result Value Ref Range   Microalb, Ur Waived 10 0 - 19 mg/L   Creatinine, Urine Waived 100 10 - 300 mg/dL   Microalb/Creat Ratio <30 <30 mg/g      Assessment & Plan:   Problem List Items Addressed This Visit       Cardiovascular and Mediastinum   Hypertension    Still running a little high- will increase amlodipine to '5mg'$  and recheck 1 month. Call with any concerns.       Relevant Medications   amLODipine (NORVASC) 5 MG tablet   Other Relevant Orders   Microalbumin, Urine Waived (Completed)     Other   Anxiety    Will change him to cymbalta and recheck 1 month. Call with any concerns.       Relevant Medications   DULoxetine (CYMBALTA) 20 MG capsule   Other Visit Diagnoses     Routine general medical examination at a health care  facility    -  Primary   Vaccines up to date. Screening labs checked today. Colonoscopy up to date. Continue diet and exercise. Call with any concerns.    Relevant Orders   Comprehensive metabolic panel   CBC with Differential/Platelet   Lipid Panel w/o Chol/HDL Ratio   PSA   TSH   Urinalysis, Routine w reflex microscopic (Completed)        LABORATORY TESTING:  Health maintenance labs ordered today as discussed above.   The natural history of prostate cancer and ongoing controversy regarding screening and potential treatment outcomes of prostate cancer has been discussed with the patient. The meaning of a false positive PSA and a false negative PSA has been discussed. He indicates understanding of the limitations of this screening test and wishes to  proceed with screening PSA testing.   IMMUNIZATIONS:   - Tdap: Tetanus vaccination status reviewed: last tetanus booster within 10 years. - Influenza: Up to date - Pneumovax: Not applicable - Prevnar: Not applicable - COVID: Up to date - HPV: Not applicable - Shingrix vaccine: Not applicable  SCREENING: - Colonoscopy: Up to date  Discussed with patient purpose of the colonoscopy is to detect colon cancer at curable precancerous or early stages   PATIENT COUNSELING:    Sexuality: Discussed sexually transmitted diseases, partner selection, use of condoms, avoidance of unintended pregnancy  and contraceptive alternatives.   Advised to avoid cigarette smoking.  I discussed with the patient that most people either abstain from alcohol or drink within safe limits (<=14/week and <=4 drinks/occasion for males, <=7/weeks and <= 3 drinks/occasion for females) and that the risk for alcohol disorders and other health effects rises proportionally with the number of drinks per week and how often a drinker exceeds daily limits.  Discussed cessation/primary prevention of drug use and availability of treatment for abuse.   Diet: Encouraged to  adjust caloric intake to maintain  or achieve ideal body weight, to reduce intake of dietary saturated fat and total fat, to limit sodium intake by avoiding high sodium foods and not adding table salt, and to maintain adequate dietary potassium and calcium preferably from fresh fruits, vegetables, and low-fat dairy products.    stressed the importance of regular exercise  Injury prevention: Discussed safety belts, safety helmets, smoke detector, smoking near bedding or upholstery.   Dental health: Discussed importance of regular tooth brushing, flossing, and dental visits.   Follow up plan: NEXT PREVENTATIVE PHYSICAL DUE IN 1 YEAR. Return in about 4 weeks (around 03/30/2022).

## 2022-03-02 NOTE — Assessment & Plan Note (Signed)
Still running a little high- will increase amlodipine to '5mg'$  and recheck 1 month. Call with any concerns.

## 2022-03-02 NOTE — Assessment & Plan Note (Signed)
Will change him to cymbalta and recheck 1 month. Call with any concerns.

## 2022-03-03 LAB — TSH: TSH: 0.937 u[IU]/mL (ref 0.450–4.500)

## 2022-03-03 LAB — COMPREHENSIVE METABOLIC PANEL
ALT: 23 IU/L (ref 0–44)
AST: 21 IU/L (ref 0–40)
Albumin/Globulin Ratio: 1.7 (ref 1.2–2.2)
Albumin: 4.5 g/dL (ref 4.1–5.1)
Alkaline Phosphatase: 68 IU/L (ref 44–121)
BUN/Creatinine Ratio: 15 (ref 9–20)
BUN: 16 mg/dL (ref 6–24)
Bilirubin Total: <0.2 mg/dL (ref 0.0–1.2)
CO2: 20 mmol/L (ref 20–29)
Calcium: 9.6 mg/dL (ref 8.7–10.2)
Chloride: 104 mmol/L (ref 96–106)
Creatinine, Ser: 1.1 mg/dL (ref 0.76–1.27)
Globulin, Total: 2.7 g/dL (ref 1.5–4.5)
Glucose: 91 mg/dL (ref 70–99)
Potassium: 4.7 mmol/L (ref 3.5–5.2)
Sodium: 142 mmol/L (ref 134–144)
Total Protein: 7.2 g/dL (ref 6.0–8.5)
eGFR: 84 mL/min/{1.73_m2} (ref >59)

## 2022-03-03 LAB — CBC WITH DIFFERENTIAL/PLATELET
Basophils Absolute: 0 10*3/uL (ref 0.0–0.2)
Basos: 1 %
EOS (ABSOLUTE): 0.1 10*3/uL (ref 0.0–0.4)
Eos: 2 %
Hematocrit: 46.5 % (ref 37.5–51.0)
Hemoglobin: 14.4 g/dL (ref 13.0–17.7)
Immature Grans (Abs): 0 10*3/uL (ref 0.0–0.1)
Immature Granulocytes: 0 %
Lymphocytes Absolute: 1.9 10*3/uL (ref 0.7–3.1)
Lymphs: 45 %
MCH: 28.4 pg (ref 26.6–33.0)
MCHC: 31 g/dL — ABNORMAL LOW (ref 31.5–35.7)
MCV: 92 fL (ref 79–97)
Monocytes Absolute: 0.3 10*3/uL (ref 0.1–0.9)
Monocytes: 8 %
Neutrophils Absolute: 1.8 10*3/uL (ref 1.4–7.0)
Neutrophils: 44 %
Platelets: 238 10*3/uL (ref 150–450)
RBC: 5.07 x10E6/uL (ref 4.14–5.80)
RDW: 14.3 % (ref 11.6–15.4)
WBC: 4.2 10*3/uL (ref 3.4–10.8)

## 2022-03-03 LAB — LIPID PANEL W/O CHOL/HDL RATIO
Cholesterol, Total: 146 mg/dL (ref 100–199)
HDL: 40 mg/dL (ref >39)
LDL Chol Calc (NIH): 93 mg/dL (ref 0–99)
Triglycerides: 62 mg/dL (ref 0–149)
VLDL Cholesterol Cal: 13 mg/dL (ref 5–40)

## 2022-03-03 LAB — PSA: Prostate Specific Ag, Serum: 1.9 ng/mL (ref 0.0–4.0)

## 2022-03-30 ENCOUNTER — Ambulatory Visit: Payer: BC Managed Care – PPO | Admitting: Family Medicine

## 2022-03-30 ENCOUNTER — Encounter: Payer: Self-pay | Admitting: Family Medicine

## 2022-03-30 VITALS — BP 128/82 | HR 77 | Temp 98.6°F | Ht 73.5 in | Wt 226.8 lb

## 2022-03-30 DIAGNOSIS — M7918 Myalgia, other site: Secondary | ICD-10-CM | POA: Diagnosis not present

## 2022-03-30 DIAGNOSIS — F419 Anxiety disorder, unspecified: Secondary | ICD-10-CM | POA: Diagnosis not present

## 2022-03-30 DIAGNOSIS — I10 Essential (primary) hypertension: Secondary | ICD-10-CM | POA: Diagnosis not present

## 2022-03-30 DIAGNOSIS — K219 Gastro-esophageal reflux disease without esophagitis: Secondary | ICD-10-CM | POA: Diagnosis not present

## 2022-03-30 MED ORDER — PREGABALIN 75 MG PO CAPS: ORAL_CAPSULE | ORAL | 2 refills | Status: DC

## 2022-03-30 MED ORDER — AMLODIPINE BESYLATE 5 MG PO TABS: 5.0000 mg | ORAL_TABLET | Freq: Every day | ORAL | 1 refills | Status: DC

## 2022-03-30 MED ORDER — DULOXETINE HCL 60 MG PO CPEP: 60.0000 mg | ORAL_CAPSULE | Freq: Every day | ORAL | 2 refills | Status: DC

## 2022-03-30 NOTE — Progress Notes (Signed)
BP 128/82 (BP Location: Left Arm, Cuff Size: Normal)   Pulse 77   Temp 98.6 F (37 C) (Oral)   Ht 6' 1.5" (1.867 m)   Wt 226 lb 12.8 oz (102.9 kg)   SpO2 98%   BMI 29.52 kg/m    Subjective:    Patient ID: John Huang, male    DOB: May 06, 1976, 46 y.o.   MRN: [id number]  HPI: John Huang is a 46 y.o. male  Chief Complaint  Patient presents with   Hypertension   Anxiety   Neck Pain    Patient says he is in extreme pain with his neck and back. Patient says the Cymbalta prescription isn't helping.    Back Pain   HYPERTENSION  Hypertension status: better  Satisfied with current treatment? yes Duration of hypertension: chronic BP monitoring frequency:  not checking BP medication side effects:  no Medication compliance: excellent compliance Previous BP meds:amlodipine Aspirin: no Recurrent headaches: no Visual changes: no Palpitations: no Dyspnea: no Chest pain: no Lower extremity edema: no Dizzy/lightheaded: no  ANXIETY/STRESS Duration:controlled Anxious mood: no  Excessive worrying: no Irritability: no  Sweating: no Nausea: no Palpitations:no Hyperventilation: no Panic attacks: no Agoraphobia: no  Obscessions/compulsions: no Depressed mood: no    03/30/2022    9:03 AM 03/02/2022   11:19 AM 02/02/2022   10:28 AM 04/24/2021   10:17 AM 02/06/2021   11:13 AM  Depression screen PHQ 2/9  Decreased Interest 0 0 0 0 0  Down, Depressed, Hopeless 0 0 0 0 0  PHQ - 2 Score 0 0 0 0 0  Altered sleeping 0 0 0 0 3  Tired, decreased energy 0 0 0 0 0  Change in appetite 0 0 0 0 0  Feeling bad or failure about yourself  0 0 0 0 0  Trouble concentrating 0 0 0 0 0  Moving slowly or fidgety/restless 0 0 0 0 0  Suicidal thoughts 0 0 0 0 0  PHQ-9 Score 0 0 0 0 3  Difficult doing work/chores Not difficult at all  Not difficult at all  Not difficult at all      03/30/2022    9:03 AM 03/02/2022   11:20 AM 02/02/2022   10:28 AM 04/24/2021   10:17 AM  GAD 7 :  Generalized Anxiety Score  Nervous, Anxious, on Edge 0 0 0 0  Control/stop worrying 0 0 0 0  Worry too much - different things 0 0 0 0  Trouble relaxing 0 0 0 0  Restless 0 0 0 0  Easily annoyed or irritable 0 0 0 0  Afraid - awful might happen 0 0 0 0  Total GAD 7 Score 0 0 0 0  Anxiety Difficulty Not difficult at all      Anhedonia: no Weight changes: no Insomnia: no   Hypersomnia: no Fatigue/loss of energy: no Feelings of worthlessness: no Feelings of guilt: no Impaired concentration/indecisiveness: no Suicidal ideations: no  Crying spells: no Recent Stressors/Life Changes: no   Relationship problems: no   Family stress: no     Financial stress: no    Job stress: no    Recent death/loss: no  CHRONIC PAIN- does not feel like the pain has changed at all on the medication  Pain control status: uncontrolled Duration: chronic Location: widespread Quality: aching and sore Current Pain Level: severe Previous Pain Level: moderate What Activities task can be accomplished with current medication? Able to do his ADLs Interested in weaning off  narcotics:N/A   Stool softners/OTC fiber: no  Previous pain specialty evaluation: yes Non-narcotic analgesic meds: yes Narcotic contract: N/A  Relevant past medical, surgical, family and social history reviewed and updated as indicated. Interim medical history since our last visit reviewed. Allergies and medications reviewed and updated.  Review of Systems  Constitutional: Negative.   HENT: Negative.    Respiratory: Negative.    Cardiovascular: Negative.   Gastrointestinal: Negative.   Musculoskeletal:  Positive for arthralgias, back pain, myalgias, neck pain and neck stiffness. Negative for gait problem and joint swelling.  Skin: Negative.   Neurological: Negative.   Psychiatric/Behavioral: Negative.  Negative for agitation, behavioral problems, confusion, decreased concentration, dysphoric mood, hallucinations, self-injury, sleep  disturbance and suicidal ideas. The patient is not nervous/anxious and is not hyperactive.     Per HPI unless specifically indicated above     Objective:    BP 128/82 (BP Location: Left Arm, Cuff Size: Normal)   Pulse 77   Temp 98.6 F (37 C) (Oral)   Ht 6' 1.5" (1.867 m)   Wt 226 lb 12.8 oz (102.9 kg)   SpO2 98%   BMI 29.52 kg/m   Wt Readings from Last 3 Encounters:  03/30/22 226 lb 12.8 oz (102.9 kg)  03/02/22 221 lb 1.6 oz (100.3 kg)  02/02/22 223 lb 6.4 oz (101.3 kg)    Physical Exam Vitals and nursing note reviewed.  Constitutional:      General: He is not in acute distress.    Appearance: Normal appearance. He is normal weight. He is not ill-appearing, toxic-appearing or diaphoretic.  HENT:     Head: Normocephalic and atraumatic.     Right Ear: External ear normal.     Left Ear: External ear normal.     Nose: Nose normal.     Mouth/Throat:     Mouth: Mucous membranes are moist.     Pharynx: Oropharynx is clear.  Eyes:     General: No scleral icterus.       Right eye: No discharge.        Left eye: No discharge.     Extraocular Movements: Extraocular movements intact.     Conjunctiva/sclera: Conjunctivae normal.     Pupils: Pupils are equal, round, and reactive to light.  Cardiovascular:     Rate and Rhythm: Normal rate and regular rhythm.     Pulses: Normal pulses.     Heart sounds: Normal heart sounds. No murmur heard.    No friction rub. No gallop.  Pulmonary:     Effort: Pulmonary effort is normal. No respiratory distress.     Breath sounds: Normal breath sounds. No stridor. No wheezing, rhonchi or rales.  Chest:     Chest wall: No tenderness.  Musculoskeletal:        General: Normal range of motion.     Cervical back: Normal range of motion and neck supple.  Skin:    General: Skin is warm and dry.     Capillary Refill: Capillary refill takes less than 2 seconds.     Coloration: Skin is not jaundiced or pale.     Findings: No bruising, erythema,  lesion or rash.  Neurological:     General: No focal deficit present.     Mental Status: He is alert and oriented to person, place, and time. Mental status is at baseline.  Psychiatric:        Mood and Affect: Mood normal.        Behavior: Behavior normal.  Thought Content: Thought content normal.        Judgment: Judgment normal.     Results for orders placed or performed in visit on 03/02/22  Comprehensive metabolic panel  Result Value Ref Range   Glucose 91 70 - 99 mg/dL   BUN 16 6 - 24 mg/dL   Creatinine, Ser 1.10 0.76 - 1.27 mg/dL   eGFR 84 >59 mL/min/1.73   BUN/Creatinine Ratio 15 9 - 20   Sodium 142 134 - 144 mmol/L   Potassium 4.7 3.5 - 5.2 mmol/L   Chloride 104 96 - 106 mmol/L   CO2 20 20 - 29 mmol/L   Calcium 9.6 8.7 - 10.2 mg/dL   Total Protein 7.2 6.0 - 8.5 g/dL   Albumin 4.5 4.1 - 5.1 g/dL   Globulin, Total 2.7 1.5 - 4.5 g/dL   Albumin/Globulin Ratio 1.7 1.2 - 2.2   Bilirubin Total <0.2 0.0 - 1.2 mg/dL   Alkaline Phosphatase 68 44 - 121 IU/L   AST 21 0 - 40 IU/L   ALT 23 0 - 44 IU/L  CBC with Differential/Platelet  Result Value Ref Range   WBC 4.2 3.4 - 10.8 x10E3/uL   RBC 5.07 4.14 - 5.80 x10E6/uL   Hemoglobin 14.4 13.0 - 17.7 g/dL   Hematocrit 46.5 37.5 - 51.0 %   MCV 92 79 - 97 fL   MCH 28.4 26.6 - 33.0 pg   MCHC 31.0 (L) 31.5 - 35.7 g/dL   RDW 14.3 11.6 - 15.4 %   Platelets 238 150 - 450 x10E3/uL   Neutrophils 44 Not Estab. %   Lymphs 45 Not Estab. %   Monocytes 8 Not Estab. %   Eos 2 Not Estab. %   Basos 1 Not Estab. %   Neutrophils Absolute 1.8 1.4 - 7.0 x10E3/uL   Lymphocytes Absolute 1.9 0.7 - 3.1 x10E3/uL   Monocytes Absolute 0.3 0.1 - 0.9 x10E3/uL   EOS (ABSOLUTE) 0.1 0.0 - 0.4 x10E3/uL   Basophils Absolute 0.0 0.0 - 0.2 x10E3/uL   Immature Granulocytes 0 Not Estab. %   Immature Grans (Abs) 0.0 0.0 - 0.1 x10E3/uL  Lipid Panel w/o Chol/HDL Ratio  Result Value Ref Range   Cholesterol, Total 146 100 - 199 mg/dL   Triglycerides 62 0  - 149 mg/dL   HDL 40 >39 mg/dL   VLDL Cholesterol Cal 13 5 - 40 mg/dL   LDL Chol Calc (NIH) 93 0 - 99 mg/dL  PSA  Result Value Ref Range   Prostate Specific Ag, Serum 1.9 0.0 - 4.0 ng/mL  TSH  Result Value Ref Range   TSH 0.937 0.450 - 4.500 uIU/mL  Urinalysis, Routine w reflex microscopic  Result Value Ref Range   Specific Gravity, UA 1.020 1.005 - 1.030   pH, UA 7.0 5.0 - 7.5   Color, UA Yellow Yellow   Appearance Ur Clear Clear   Leukocytes,UA Negative Negative   Protein,UA Negative Negative/Trace   Glucose, UA Negative Negative   Ketones, UA Negative Negative   RBC, UA Negative Negative   Bilirubin, UA Negative Negative   Urobilinogen, Ur 0.2 0.2 - 1.0 mg/dL   Nitrite, UA Negative Negative  Microalbumin, Urine Waived  Result Value Ref Range   Microalb, Ur Waived 10 0 - 19 mg/L   Creatinine, Urine Waived 100 10 - 300 mg/dL   Microalb/Creat Ratio <30 <30 mg/g      Assessment & Plan:   Problem List Items Addressed This Visit  Cardiovascular and Mediastinum   Hypertension - Primary    Under good control on current regimen. Continue current regimen. Continue to monitor. Call with any concerns. Refills given. Labs drawn today.       Relevant Medications   amLODipine (NORVASC) 5 MG tablet   Other Relevant Orders   Basic metabolic panel     Digestive   GERD (gastroesophageal reflux disease)    Significantly worse. Already taking 50m omeprazole BID. Will get him back into see GI. Call with any concerns.         Other   Anxiety    Doing great. Tolerating his medicine well. No concerns.       Relevant Medications   DULoxetine (CYMBALTA) 60 MG capsule   Myofascial pain    Not significantly better on cymbalta. Will increase him to 697mand if not better in 2 weeks will start lyrica. Rx sent to his pharmacy. Follow up in 6 weeks. Call with any concerns.         Follow up plan: Return in about 6 weeks (around 05/11/2022).

## 2022-03-30 NOTE — Assessment & Plan Note (Signed)
Under good control on current regimen. Continue current regimen. Continue to monitor. Call with any concerns. Refills given. Labs drawn today.   

## 2022-03-30 NOTE — Assessment & Plan Note (Signed)
Not significantly better on cymbalta. Will increase him to '60mg'$  and if not better in 2 weeks will start lyrica. Rx sent to his pharmacy. Follow up in 6 weeks. Call with any concerns.

## 2022-03-30 NOTE — Assessment & Plan Note (Signed)
Significantly worse. Already taking '40mg'$  omeprazole BID. Will get him back into see GI. Call with any concerns.

## 2022-03-30 NOTE — Assessment & Plan Note (Signed)
Doing great. Tolerating his medicine well. No concerns.

## 2022-03-31 LAB — BASIC METABOLIC PANEL
BUN/Creatinine Ratio: 13 (ref 9–20)
BUN: 18 mg/dL (ref 6–24)
CO2: 22 mmol/L (ref 20–29)
Calcium: 9.5 mg/dL (ref 8.7–10.2)
Chloride: 104 mmol/L (ref 96–106)
Creatinine, Ser: 1.34 mg/dL — ABNORMAL HIGH (ref 0.76–1.27)
Glucose: 72 mg/dL (ref 70–99)
Potassium: 5.2 mmol/L (ref 3.5–5.2)
Sodium: 144 mmol/L (ref 134–144)
eGFR: 66 mL/min/{1.73_m2} (ref >59)

## 2022-04-01 ENCOUNTER — Ambulatory Visit: Payer: BC Managed Care – PPO | Admitting: Gastroenterology

## 2022-04-01 ENCOUNTER — Encounter: Payer: Self-pay | Admitting: Gastroenterology

## 2022-04-01 VITALS — BP 141/93 | HR 62 | Temp 98.4°F | Ht 73.5 in | Wt 228.0 lb

## 2022-04-01 DIAGNOSIS — R140 Abdominal distension (gaseous): Secondary | ICD-10-CM | POA: Diagnosis not present

## 2022-04-01 DIAGNOSIS — K219 Gastro-esophageal reflux disease without esophagitis: Secondary | ICD-10-CM | POA: Diagnosis not present

## 2022-04-01 MED ORDER — PANTOPRAZOLE SODIUM 40 MG PO TBEC: 40.0000 mg | DELAYED_RELEASE_TABLET | Freq: Every day | ORAL | 3 refills | Status: DC

## 2022-04-01 NOTE — Progress Notes (Signed)
Primary Care Physician: Valerie Roys, DO  Primary Gastroenterologist:  Dr. Lucilla Lame  Chief Complaint  Patient presents with   Gastroesophageal Reflux    Pt reports intermittent regurgitation and increased sensitivity to certain foods    HPI: AYCEN PORRECA is a 46 y.o. male here with a history of having an EGD and colonoscopy by me in the past.  The upper endoscopy showed mild gastritis and the colonoscopy showed a perianal fistula and the patient was referred to surgery.  The patient is now reported to be here because of acid reflux. The patient reports that he has multiple issues going on right now and states that he was treated for whole body yeast infection.  The patient also reports that he has burning of his gums in his tongue with things like pepper tasting badly.  He is on omeprazole 40 mg twice a day but states he continues to have bloating and burning in his mouth without any burning in his esophagus.  There is also distention of his abdomen and a lot of gas that he states is more noticeable since he has lost weight.  He also reports that he did allergy testing and was found to have an allergy to wheat among other things and has been eating cheese. The patient is also concerned that he continues to have night sweats and thinks it may be related to his GI tract.  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 90 tablet 1   clotrimazole-betamethasone (LOTRISONE) cream Apply 1 application topically daily. 30 g 0   DULoxetine (CYMBALTA) 60 MG capsule Take 1 capsule (60 mg total) by mouth daily. 30 capsule 2   fexofenadine (ALLEGRA) 180 MG tablet Take 1-2 tablets (180-360 mg total) by mouth daily. 180 tablet 3   Multiple Vitamin (MULTI-VITAMINS) TABS Take by mouth.     omeprazole (PRILOSEC) 40 MG capsule Take 1 capsule (40 mg total) by mouth daily. 90 capsule 1   pregabalin (LYRICA) 75 MG capsule Take 1 pill at bedtime for 1 week, then 1 pill BID for 1 week, then 1 pill in the AM and 2 pills in the PM for 1 week, then 2 pills BID until you see me 120 capsule 2   traZODone (DESYREL) 50 MG tablet TAKE 1/2 TO 1 (ONE-HALF TO ONE) TABLET BY MOUTH AT BEDTIME AS NEEDED FOR SLEEP 90 tablet 1   No current facility-administered medications for this visit.    Allergies as of 04/01/2022 - Review Complete 03/30/2022  Allergen Reaction Noted   Cat hair extract Hives and Other (See Comments) 10/15/2015   Other Hives and Other (See Comments) 10/15/2015   Alpha-gal Hives 10/28/2020  Galactose Other (See Comments) 05/20/2017    ROS:  General: Negative for anorexia, weight loss, fever, chills, fatigue, weakness. ENT: Negative for hoarseness, difficulty swallowing , nasal congestion. CV: Negative for chest pain, angina, palpitations, dyspnea on exertion, peripheral edema.  Respiratory: Negative for dyspnea at rest, dyspnea on exertion, cough, sputum, wheezing.  GI: See history of present illness. GU:  Negative for dysuria, hematuria, urinary incontinence, urinary frequency, nocturnal urination.  Endo: Negative for unusual weight change.    Physical Examination:   BP (!) 141/93 (BP Location: Left Arm, Patient Position: Sitting, Cuff Size: Large)   Pulse 62   Temp 98.4 F (36.9 C) (Oral)   Ht 6' 1.5" (1.867 m)   Wt 228 lb (103.4 kg)    BMI 29.67 kg/m   General: Well-nourished, well-developed in no acute distress.  Eyes: No icterus. Conjunctivae pink. Neuro: Alert and oriented x 3.  Grossly intact. Skin: Warm and dry, no jaundice.   Psych: Alert and cooperative, normal mood and affect.  Labs:    Imaging Studies: No results found.  Assessment and Plan:   JOSIAH NIETO is a 46 y.o. y/o male who comes in today with a history of multiple medical issues including bloating, burning in his mouth and gums and he also reports that his stools burned when they come out.  The patient will be switched from omeprazole to pantoprazole to be taken in the evening.  The patient has been told that his reflux is unlikely causing him to have the night sweats since he is on a PPI and the night sweats are occurring.  The patient has been told to avoid cheeses and dairy products to see if that helps his burning from his rectum.  He describes it as feeling like acid coming out of his bottom.  The patient has also been told to avoid gassy foods and see if any of the foods he is eating may be causing worsening of his problems and he has been encouraged to keep a food log.  The patient has been explained the plan and agrees with it.     Lucilla Lame, MD. Marval Regal    Note: This dictation was prepared with Dragon dictation along with smaller phrase technology. Any transcriptional errors that result from this process are unintentional.

## 2022-04-02 ENCOUNTER — Encounter: Payer: Self-pay | Admitting: Family Medicine

## 2022-04-02 DIAGNOSIS — R61 Generalized hyperhidrosis: Secondary | ICD-10-CM

## 2022-04-03 ENCOUNTER — Other Ambulatory Visit: Payer: Self-pay | Admitting: Family Medicine

## 2022-04-03 DIAGNOSIS — N289 Disorder of kidney and ureter, unspecified: Secondary | ICD-10-CM

## 2022-04-06 ENCOUNTER — Encounter: Payer: Self-pay | Admitting: Family Medicine

## 2022-04-14 ENCOUNTER — Inpatient Hospital Stay: Payer: BC Managed Care – PPO

## 2022-04-14 ENCOUNTER — Encounter: Payer: Self-pay | Admitting: Oncology

## 2022-04-14 ENCOUNTER — Inpatient Hospital Stay: Payer: BC Managed Care – PPO | Attending: Oncology | Admitting: Oncology

## 2022-04-14 VITALS — BP 138/94 | HR 72 | Temp 97.0°F | Resp 16 | Ht 73.5 in | Wt 228.4 lb

## 2022-04-14 DIAGNOSIS — G8929 Other chronic pain: Secondary | ICD-10-CM | POA: Insufficient documentation

## 2022-04-14 DIAGNOSIS — R61 Generalized hyperhidrosis: Secondary | ICD-10-CM

## 2022-04-14 LAB — COMPREHENSIVE METABOLIC PANEL
ALT: 22 U/L (ref 0–44)
AST: 23 U/L (ref 15–41)
Albumin: 4.1 g/dL (ref 3.5–5.0)
Alkaline Phosphatase: 57 U/L (ref 38–126)
Anion gap: 6 (ref 5–15)
BUN: 16 mg/dL (ref 6–20)
CO2: 28 mmol/L (ref 22–32)
Calcium: 8.9 mg/dL (ref 8.9–10.3)
Chloride: 103 mmol/L (ref 98–111)
Creatinine, Ser: 1.09 mg/dL (ref 0.61–1.24)
GFR, Estimated: >60 mL/min (ref >60)
Glucose, Bld: 90 mg/dL (ref 70–99)
Potassium: 4.1 mmol/L (ref 3.5–5.1)
Sodium: 137 mmol/L (ref 135–145)
Total Bilirubin: 0.3 mg/dL (ref 0.3–1.2)
Total Protein: 7.9 g/dL (ref 6.5–8.1)

## 2022-04-14 LAB — CBC WITH DIFFERENTIAL/PLATELET
Abs Immature Granulocytes: 0.01 10*3/uL (ref 0.00–0.07)
Basophils Absolute: 0 10*3/uL (ref 0.0–0.1)
Basophils Relative: 1 %
Eosinophils Absolute: 0.1 10*3/uL (ref 0.0–0.5)
Eosinophils Relative: 2 %
HCT: 44.4 % (ref 39.0–52.0)
Hemoglobin: 14.1 g/dL (ref 13.0–17.0)
Immature Granulocytes: 0 %
Lymphocytes Relative: 34 %
Lymphs Abs: 2.4 10*3/uL (ref 0.7–4.0)
MCH: 28.5 pg (ref 26.0–34.0)
MCHC: 31.8 g/dL (ref 30.0–36.0)
MCV: 89.7 fL (ref 80.0–100.0)
Monocytes Absolute: 0.5 10*3/uL (ref 0.1–1.0)
Monocytes Relative: 7 %
Neutro Abs: 4.1 10*3/uL (ref 1.7–7.7)
Neutrophils Relative %: 56 %
Platelets: 225 10*3/uL (ref 150–400)
RBC: 4.95 MIL/uL (ref 4.22–5.81)
RDW: 15.3 % (ref 11.5–15.5)
WBC: 7.2 10*3/uL (ref 4.0–10.5)
nRBC: 0 % (ref 0.0–0.2)

## 2022-04-14 LAB — FOLATE: Folate: 12.5 ng/mL (ref >5.9)

## 2022-04-14 LAB — SEDIMENTATION RATE: Sed Rate: 16 mm/hr — ABNORMAL HIGH (ref 0–15)

## 2022-04-14 LAB — VITAMIN B12: Vitamin B-12: 330 pg/mL (ref 180–914)

## 2022-04-14 LAB — C-REACTIVE PROTEIN: CRP: 0.8 mg/dL (ref <1.0)

## 2022-04-14 NOTE — Progress Notes (Signed)
[place]  Telephone:(336) [phone] Fax:(336) [phone]  ID: [name] OB: 06-02-1975  MR#: [id number]  XKG#:[id number]  Patient Care Team: Valerie Roys, DO as PCP - General (Family Medicine) Alda Berthold, DO as Consulting Physician (Neurology)  CHIEF COMPLAINT: Night sweats, chronic pain.  INTERVAL HISTORY: Patient is a 46 year old male who reports he has had night sweats and chronic pain particularly in his neck that have been persistent for at least 7 to 8 years.  He reports full work-up up-to-date has been negative with no clear etiology.  He is referred for further evaluation.  He has no neurologic complaints.  He denies any fevers or unintentional weight loss.  He has no dysphagia or difficulty swallowing.  He has no chest pain, shortness of breath, cough, or hemoptysis.  He denies any nausea, vomiting, constipation, or diarrhea.  He has no melena or hematochezia.  He has no urinary complaints.  Patient offers no further specific complaints today.  REVIEW OF SYSTEMS:   Review of Systems  Constitutional:  Positive for diaphoresis. Negative for fever, malaise/fatigue and weight loss.  Respiratory: Negative.  Negative for cough, hemoptysis and shortness of breath.   Cardiovascular: Negative.  Negative for chest pain, palpitations and leg swelling.  Gastrointestinal: Negative.  Negative for abdominal pain, blood in stool and melena.  Genitourinary: Negative.  Negative for dysuria and hematuria.  Musculoskeletal:  Positive for joint pain, myalgias and neck pain.  Skin: Negative.  Negative for rash.  Neurological: Negative.  Negative for dizziness, focal weakness, weakness and headaches.  Psychiatric/Behavioral:  The patient has insomnia.     As per HPI. Otherwise, a complete review of systems is negative.  PAST MEDICAL HISTORY: Past Medical History:  Diagnosis Date   Allergy 03/07/1995   Asthma    when 46 yrs old, years ago   Bulging lumbar disc     CAP (community acquired pneumonia) 02/18/2016   Cholinergic urticaria    Complication of anesthesia    Depression 02/26/2022   I lost my father in January of  this year.   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   COLONOSCOPY WITH PROPOFOL N/A 08/16/2020   Procedure: COLONOSCOPY WITH PROPOFOL;  Surgeon: Lucilla Lame, MD;  Location: [facility];  Service: Endoscopy;  Laterality: N/A;   ELBOW SURGERY Left 06/20/2020   ESOPHAGOGASTRODUODENOSCOPY (EGD) WITH PROPOFOL N/A 08/16/2020   Procedure: ESOPHAGOGASTRODUODENOSCOPY (EGD) WITH PROPOFOL;  Surgeon: Lucilla Lame, MD;  Location: [facility];  Service: Endoscopy;  Laterality: N/A;   FOOT SURGERY  2020   MEDIAL COLLATERAL LIGAMENT AND LATERAL COLLATERAL LIGAMENT REPAIR, KNEE     SHOULDER SURGERY     dislocation and tac left in there, so had to have surgery to removed the tac   TONSILLECTOMY  2020   WISDOM TOOTH EXTRACTION      FAMILY HISTORY: Family History  Problem Relation Age of Onset  Stroke Mother    Hypertension Mother    Diabetes Mother    Arthritis Mother    Asthma Mother    Cancer Father        Throat   Cancer Sister        Eye/Brain   Hypertension Brother    Irritable bowel syndrome Brother    Cancer Maternal Grandmother    Cancer Paternal Grandmother    Cancer Paternal Uncle     ADVANCED DIRECTIVES (Y/N):  N  HEALTH MAINTENANCE: Social History   Tobacco Use   Smoking status: Never   Smokeless tobacco: Never  Vaping Use    Vaping Use: Never used  Substance Use Topics   Alcohol use: Not Currently   Drug use: Never     Colonoscopy:  PAP:  Bone density:  Lipid panel:  Allergies  Allergen Reactions   Cat Hair Extract Hives and Other (See Comments)    Wheezing, eyes itch Wheezing, eyes itch Wheezing, eyes itch   Other Hives and Other (See Comments)    Wheezing, eyes itch   Alpha-Gal Hives   Galactose Other (See Comments)    Flu like symptoms after consuming mammalian products Flu like symptoms after consuming mammalian products Flu like symptoms after consuming mammalian products Flu like symptoms after consuming mammalian products    Current Outpatient Medications  Medication Sig Dispense Refill   amLODipine (NORVASC) 5 MG tablet Take 1 tablet (5 mg total) by mouth daily. 90 tablet 1   DHEA 10 MG CAPS Take 10 mg by mouth.     DULoxetine (CYMBALTA) 60 MG capsule Take 1 capsule (60 mg total) by mouth daily. 30 capsule 2   fexofenadine (ALLEGRA) 180 MG tablet Take 1-2 tablets (180-360 mg total) by mouth daily. 180 tablet 3   Multiple Vitamin (MULTI-VITAMINS) TABS Take by mouth.     Nutritional Supplements (ADRENAL COMPLEX PO) Take by mouth.     pantoprazole (PROTONIX) 40 MG tablet Take 1 tablet (40 mg total) by mouth at bedtime. 30 tablet 3   pregabalin (LYRICA) 75 MG capsule Take 1 pill at bedtime for 1 week, then 1 pill BID for 1 week, then 1 pill in the AM and 2 pills in the PM for 1 week, then 2 pills BID until you see me 120 capsule 2   traZODone (DESYREL) 50 MG tablet TAKE 1/2 TO 1 (ONE-HALF TO ONE) TABLET BY MOUTH AT BEDTIME AS NEEDED FOR SLEEP 90 tablet 1   clotrimazole-betamethasone (LOTRISONE) cream Apply 1 application topically daily. (Patient not taking: Reported on 04/14/2022) 30 g 0   No current facility-administered medications for this visit.    OBJECTIVE: Vitals:   04/14/22 1329  BP: (!) 138/94  Pulse: 72  Resp: 16  Temp: (!) 97 F (36.1 C)  SpO2: 100%     Body mass index  is 29.73 kg/m.    ECOG FS:1 - Symptomatic but completely ambulatory  General: Well-developed, well-nourished, no acute distress. Eyes: Pink conjunctiva, anicteric sclera. HEENT: Normocephalic, moist mucous membranes. Lungs: No audible wheezing or coughing. Heart: Regular rate and rhythm. Abdomen: Soft, nontender, no obvious distention. Musculoskeletal: No edema, cyanosis, or clubbing. Neuro: Alert, answering all questions appropriately. Cranial nerves grossly intact. Skin: No rashes or petechiae noted. Psych: Normal affect. Lymphatics: No cervical, calvicular, axillary or inguinal LAD.   LAB RESULTS:  Lab Results  Component Value Date   NA 137 04/14/2022   K 4.1 04/14/2022   CL 103 04/14/2022   CO2 28 04/14/2022  GLUCOSE 90 04/14/2022   BUN 16 04/14/2022   CREATININE 1.09 04/14/2022   CALCIUM 8.9 04/14/2022   PROT 7.9 04/14/2022   ALBUMIN 4.1 04/14/2022   AST 23 04/14/2022   ALT 22 04/14/2022   ALKPHOS 57 04/14/2022   BILITOT 0.3 04/14/2022   GFRNONAA >60 04/14/2022   GFRAA 92 05/30/2020    Lab Results  Component Value Date   WBC 7.2 04/14/2022   NEUTROABS 4.1 04/14/2022   HGB 14.1 04/14/2022   HCT 44.4 04/14/2022   MCV 89.7 04/14/2022   PLT 225 04/14/2022     STUDIES: No results found.  ASSESSMENT: Night sweats, chronic pain.  PLAN:    Night sweats, chronic pain: No obvious etiology.  CBC and metabolic panel are completely within normal limits.  Previously, thyroid testing was also within normal limits.  The remainder of his laboratory work from today including ANA, C-reactive protein, sed rate, and flow cytometry are pending at time of dictation.  Will get CT of chest, abdomen, and pelvis for further evaluation.  Patient will have video-assisted telemedicine visit in 2 weeks to discuss his laboratory and imaging results.  I spent a total of 45 minutes reviewing chart data, face-to-face evaluation with the patient, counseling and coordination of care as  detailed above.  Patient expressed understanding and was in agreement with this plan. He also understands that He can call clinic at any time with any questions, concerns, or complaints.    Cancer Staging  No matching staging information was found for the patient.  Lloyd Huger, MD   04/14/2022 2:57 PM

## 2022-04-15 LAB — ANA W/REFLEX: Anti Nuclear Antibody (ANA): NEGATIVE

## 2022-04-17 ENCOUNTER — Other Ambulatory Visit: Payer: BC Managed Care – PPO

## 2022-04-17 ENCOUNTER — Telehealth: Payer: Self-pay

## 2022-04-17 LAB — COMP PANEL: LEUKEMIA/LYMPHOMA

## 2022-04-17 NOTE — Telephone Encounter (Signed)
Patient left voicemail wanting to know where to go for his CT scan on 12/8 callback # [phone]

## 2022-04-20 ENCOUNTER — Other Ambulatory Visit: Payer: BC Managed Care – PPO

## 2022-04-20 DIAGNOSIS — N289 Disorder of kidney and ureter, unspecified: Secondary | ICD-10-CM | POA: Diagnosis not present

## 2022-04-22 ENCOUNTER — Other Ambulatory Visit: Payer: Self-pay | Admitting: Oncology

## 2022-04-22 DIAGNOSIS — R61 Generalized hyperhidrosis: Secondary | ICD-10-CM

## 2022-04-22 LAB — BASIC METABOLIC PANEL
BUN/Creatinine Ratio: 10 (ref 9–20)
BUN: 14 mg/dL (ref 6–24)
CO2: 24 mmol/L (ref 20–29)
Calcium: 9.6 mg/dL (ref 8.7–10.2)
Chloride: 100 mmol/L (ref 96–106)
Creatinine, Ser: 1.37 mg/dL — ABNORMAL HIGH (ref 0.76–1.27)
Glucose: 87 mg/dL (ref 70–99)
Potassium: 4.1 mmol/L (ref 3.5–5.2)
Sodium: 140 mmol/L (ref 134–144)
eGFR: 64 mL/min/{1.73_m2} (ref >59)

## 2022-04-24 ENCOUNTER — Ambulatory Visit: Admission: RE | Admit: 2022-04-24 | Payer: BC Managed Care – PPO | Source: Ambulatory Visit

## 2022-04-28 ENCOUNTER — Ambulatory Visit
Admission: RE | Admit: 2022-04-28 | Discharge: 2022-04-28 | Disposition: A | Discharge status: Home / Self Care | Payer: BC Managed Care – PPO | Source: Ambulatory Visit | Attending: Oncology | Admitting: Oncology

## 2022-04-28 ENCOUNTER — Telehealth: Payer: BC Managed Care – PPO | Admitting: Oncology

## 2022-04-28 DIAGNOSIS — R61 Generalized hyperhidrosis: Secondary | ICD-10-CM | POA: Insufficient documentation

## 2022-04-28 DIAGNOSIS — R109 Unspecified abdominal pain: Secondary | ICD-10-CM | POA: Diagnosis not present

## 2022-04-28 MED ORDER — IOHEXOL 300 MG/ML  SOLN
100.0000 mL | Freq: Once | INTRAMUSCULAR | Status: AC | PRN
  Administered 2022-04-28: 100 mL via INTRAVENOUS

## 2022-05-01 ENCOUNTER — Inpatient Hospital Stay: Payer: BC Managed Care – PPO | Admitting: Oncology

## 2022-05-01 NOTE — Progress Notes (Signed)
  [place]  Telephone:(336) [phone] Fax:(336) [phone]  ID: [name] OB: May 31, 1975  MR#: [id number]  CHJ#:[id number]  Patient Care Team: Valerie Roys, DO as PCP - General (Family Medicine) Alda Berthold, DO as Consulting Physician (Neurology)   Lloyd Huger, MD   05/01/2022 2:38 PM     This encounter was created in error - please disregard.

## 2022-05-12 ENCOUNTER — Inpatient Hospital Stay: Payer: BC Managed Care – PPO | Attending: Oncology | Admitting: Oncology

## 2022-05-12 ENCOUNTER — Ambulatory Visit: Payer: BC Managed Care – PPO | Admitting: Family Medicine

## 2022-05-12 DIAGNOSIS — R61 Generalized hyperhidrosis: Secondary | ICD-10-CM | POA: Diagnosis not present

## 2022-05-12 NOTE — Progress Notes (Signed)
[place]  Telephone:(336) [phone] Fax:(336) [phone]  ID: John Huang OB: 11-14-1975  MR#: [id number]  [id number]#:[id number]  Patient Care Team: Valerie Roys, DO as PCP - General (Family Medicine) Alda Berthold, DO as Consulting Physician (Neurology)  I connected with John Huang on 05/12/22 at  2:30 PM EST by video enabled telemedicine visit and verified that I am speaking with the correct person using two identifiers.   I discussed the limitations, risks, security and privacy concerns of performing an evaluation and management service by telemedicine and the availability of in-person appointments. I also discussed with the patient that there may be a patient responsible charge related to this service. The patient expressed understanding and agreed to proceed.   Other persons participating in the visit and their role in the encounter: Patient, MD.  Patient's location: Home. Provider's location: Clinic.  CHIEF COMPLAINT: Night sweats, chronic pain.  INTERVAL HISTORY: Patient agreed to video assisted telemedicine visit for further evaluation and discussion of his laboratory work and imaging results.  He continues to have occasional night sweats and chronic pain of unclear etiology.  Despite this, he is able to work full-time.  He has no neurologic complaints.  He denies any fevers or unintentional weight loss.  He has no dysphagia or difficulty swallowing.  He has no chest pain, shortness of breath, cough, or hemoptysis.  He denies any nausea, vomiting, constipation, or diarrhea.  He has no melena or hematochezia.  He has no urinary complaints.  Patient offers no further specific complaints today.  REVIEW OF SYSTEMS:   Review of Systems  Constitutional:  Positive for diaphoresis. Negative for fever, malaise/fatigue and weight loss.  Respiratory: Negative.  Negative for cough, hemoptysis and shortness of breath.   Cardiovascular: Negative.  Negative for chest  pain, palpitations and leg swelling.  Gastrointestinal: Negative.  Negative for abdominal pain, blood in stool and melena.  Genitourinary: Negative.  Negative for dysuria and hematuria.  Musculoskeletal:  Positive for joint pain, myalgias and neck pain.  Skin: Negative.  Negative for rash.  Neurological: Negative.  Negative for dizziness, focal weakness, weakness and headaches.  Psychiatric/Behavioral:  The patient has insomnia.     As per HPI. Otherwise, a complete review of systems is negative.  PAST MEDICAL HISTORY: Past Medical History:  Diagnosis Date   Allergy 03/07/1995   Asthma    when 46 yrs old, years ago   Bulging lumbar disc    CAP (community acquired pneumonia) 02/18/2016   Cholinergic urticaria    Complication of anesthesia    Depression 02/26/2022   I lost my father in January of  this year.   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   COLONOSCOPY WITH PROPOFOL N/A 08/16/2020   Procedure: COLONOSCOPY WITH PROPOFOL;  Surgeon: Lucilla Lame, MD;  Location: [facility];  Service: Endoscopy;  Laterality: N/A;   ELBOW SURGERY Left 06/20/2020  ESOPHAGOGASTRODUODENOSCOPY (EGD) WITH PROPOFOL N/A 08/16/2020   Procedure: ESOPHAGOGASTRODUODENOSCOPY (EGD) WITH PROPOFOL;  Surgeon: Lucilla Lame, MD;  Location: [facility];  Service: Endoscopy;  Laterality: N/A;   FOOT SURGERY  2020   MEDIAL COLLATERAL LIGAMENT AND LATERAL COLLATERAL LIGAMENT REPAIR, KNEE     SHOULDER SURGERY     dislocation and tac  left in there, so had to have surgery to removed the tac   TONSILLECTOMY  2020   WISDOM TOOTH EXTRACTION      FAMILY HISTORY: Family History  Problem Relation Age of Onset   Stroke Mother    Hypertension Mother    Diabetes Mother    Arthritis Mother    Asthma Mother    Cancer Father        Throat   Cancer Sister        Eye/Brain   Hypertension Brother    Irritable bowel syndrome Brother    Cancer Maternal Grandmother    Cancer Paternal Grandmother    Cancer Paternal Uncle     ADVANCED DIRECTIVES (Y/N):  N  HEALTH MAINTENANCE: Social History   Tobacco Use   Smoking status: Never   Smokeless tobacco: Never  Vaping Use   Vaping Use: Never used  Substance Use Topics   Alcohol use: Not Currently   Drug use: Never     Colonoscopy:  PAP:  Bone density:  Lipid panel:  Allergies  Allergen Reactions   Cat Hair Extract Hives and Other (See Comments)    Wheezing, eyes itch Wheezing, eyes itch Wheezing, eyes itch   Other Hives and Other (See Comments)    Wheezing, eyes itch   Alpha-Gal Hives   Galactose Other (See Comments)    Flu like symptoms after consuming mammalian products Flu like symptoms after consuming mammalian products Flu like symptoms after consuming mammalian products Flu like symptoms after consuming mammalian products    Current Outpatient Medications  Medication Sig Dispense Refill   amLODipine (NORVASC) 5 MG tablet Take 1 tablet (5 mg total) by mouth daily. 90 tablet 1   clotrimazole-betamethasone (LOTRISONE) cream Apply 1 application topically daily. (Patient not taking: Reported on 04/14/2022) 30 g 0   DHEA 10 MG CAPS Take 10 mg by mouth.     DULoxetine (CYMBALTA) 60 MG capsule Take 1 capsule (60 mg total) by mouth daily. 30 capsule 2   fexofenadine (ALLEGRA) 180 MG tablet Take 1-2 tablets (180-360 mg total) by mouth daily. 180 tablet 3   Multiple Vitamin (MULTI-VITAMINS) TABS Take by mouth.     Nutritional Supplements (ADRENAL COMPLEX PO)  Take by mouth.     pantoprazole (PROTONIX) 40 MG tablet Take 1 tablet (40 mg total) by mouth at bedtime. 30 tablet 3   pregabalin (LYRICA) 75 MG capsule Take 1 pill at bedtime for 1 week, then 1 pill BID for 1 week, then 1 pill in the AM and 2 pills in the PM for 1 week, then 2 pills BID until you see me 120 capsule 2   traZODone (DESYREL) 50 MG tablet TAKE 1/2 TO 1 (ONE-HALF TO ONE) TABLET BY MOUTH AT BEDTIME AS NEEDED FOR SLEEP 90 tablet 1   No current facility-administered medications for this visit.    OBJECTIVE: There were no vitals filed for this visit.    There is no height or weight on file to calculate BMI.    ECOG FS:1 - Symptomatic but completely ambulatory  General: Well-developed, well-nourished, no acute distress. HEENT: Normocephalic. Neuro: Alert, answering all questions appropriately.  Cranial nerves grossly intact. Psych: Normal affect.  LAB RESULTS:  Lab Results  Component Value Date   NA 140 04/20/2022   K 4.1 04/20/2022   CL 100 04/20/2022   CO2 24 04/20/2022   GLUCOSE 87 04/20/2022   BUN 14 04/20/2022   CREATININE 1.37 (H) 04/20/2022   CALCIUM 9.6 04/20/2022   PROT 7.9 04/14/2022   ALBUMIN 4.1 04/14/2022   AST 23 04/14/2022   ALT 22 04/14/2022   ALKPHOS 57 04/14/2022   BILITOT 0.3 04/14/2022   GFRNONAA >60 04/14/2022   GFRAA 92 05/30/2020    Lab Results  Component Value Date   WBC 7.2 04/14/2022   NEUTROABS 4.1 04/14/2022   HGB 14.1 04/14/2022   HCT 44.4 04/14/2022   MCV 89.7 04/14/2022   PLT 225 04/14/2022     STUDIES: CT ABDOMEN PELVIS W CONTRAST  Result Date: 04/28/2022 CLINICAL DATA:  Chronic abdominal pain, nausea, and night sweats for 2 years. EXAM: CT ABDOMEN AND PELVIS WITH CONTRAST TECHNIQUE: Multidetector CT imaging of the abdomen and pelvis was performed using the standard protocol following bolus administration of intravenous contrast. RADIATION DOSE REDUCTION: This exam was performed according to the departmental  dose-optimization program which includes automated exposure control, adjustment of the mA and/or kV according to patient size and/or use of iterative reconstruction technique. CONTRAST:  186m OMNIPAQUE IOHEXOL 300 MG/ML  SOLN COMPARISON:  Noncontrast CT on 05/15/2021 FINDINGS: Lower Chest: No acute findings. Hepatobiliary: A 1 cm benign hemangioma is seen in the left hepatic lobe. A sub-cm low-attenuation lesion in the dome of the right hepatic lobe is too small to characterize but most likely represents a tiny benign cyst or hemangioma. No suspicious liver lesions identified. Gallbladder is unremarkable. No evidence of biliary ductal dilatation. Pancreas:  No mass or inflammatory changes. Spleen: Within normal limits in size and appearance. Adrenals/Urinary Tract: No suspicious masses identified. No evidence of ureteral calculi or hydronephrosis. Stomach/Bowel: No evidence of obstruction, inflammatory process or abnormal fluid collections. Normal appendix visualized. Vascular/Lymphatic: No pathologically enlarged lymph nodes. No acute vascular findings. Reproductive:  No mass or other significant abnormality. Other:  None. Musculoskeletal:  No suspicious bone lesions identified. IMPRESSION: No acute findings within the abdomen or pelvis. Tiny benign hepatic hemangioma incidentally noted. Electronically Signed   By: JMarlaine HindM.D.   On: 04/28/2022 18:17    ASSESSMENT: Night sweats, chronic pain.  PLAN:    Night sweats, chronic pain: No obvious etiology.  All of patient's laboratory work including CBC, metabolic panel, ANA, C-reactive protein, sed rate, and flow cytometry are all either negative or within normal limits.  Insurance denied CT of the chest, but CT of the abdomen and pelvis did not reveal any significant pathology.  No further workup from an oncology standpoint is needed.  Patient stated he will likely continue follow-up with rheumatology.  No follow-up has been scheduled.  Renal  insufficiency: Mild.  Recommend increase fluid intake.    I provided 20 minutes of face-to-face video visit time during this encounter which included chart review, counseling, and coordination of care as documented above. Patient expressed understanding and was in agreement with this plan. He also understands that He can call clinic at any time with any questions, concerns, or complaints.    Cancer Staging  No matching staging information was found for the patient.  TLloyd Huger MD   05/12/2022 3:04 PM

## 2022-05-14 ENCOUNTER — Encounter: Payer: Self-pay | Admitting: Family Medicine

## 2022-05-14 DIAGNOSIS — G8929 Other chronic pain: Secondary | ICD-10-CM

## 2022-05-15 ENCOUNTER — Telehealth (INDEPENDENT_AMBULATORY_CARE_PROVIDER_SITE_OTHER): Payer: BC Managed Care – PPO | Admitting: Family Medicine

## 2022-05-15 ENCOUNTER — Encounter: Payer: Self-pay | Admitting: Family Medicine

## 2022-05-15 ENCOUNTER — Ambulatory Visit: Payer: BC Managed Care – PPO | Admitting: Family Medicine

## 2022-05-15 DIAGNOSIS — M7918 Myalgia, other site: Secondary | ICD-10-CM

## 2022-05-15 DIAGNOSIS — M549 Dorsalgia, unspecified: Secondary | ICD-10-CM | POA: Diagnosis not present

## 2022-05-15 DIAGNOSIS — M2550 Pain in unspecified joint: Secondary | ICD-10-CM

## 2022-05-15 MED ORDER — PREGABALIN 150 MG PO CAPS: 150.0000 mg | ORAL_CAPSULE | Freq: Three times a day (TID) | ORAL | 2 refills | Status: DC

## 2022-05-15 NOTE — Progress Notes (Signed)
There were no vitals taken for this visit.   Subjective:    Patient ID: John Huang, male    DOB: 04-19-1976, 46 y.o.   MRN: [id number]  HPI: John Huang is a 46 y.o. male  Chief Complaint  Patient presents with   Night Sweats   Joint Pain   Joint pain has been more frequent and more intense. He notes that he has been having some numbness in his R toes. He notes that his pain is getting worse. He doesn't feel like the lyrica has helped much, but his mood has been doing very well.  ARTHRALGIAS / JOINT ACHES Duration:  chronic Pain: yes Symmetric: no Severity: severe Quality: aching and sore Frequency: constant Context:  worse Decreased function/range of motion: yes  Erythema: no Swelling: yes Heat or warmth: no Morning stiffness: yes Relief with NSAIDs?: mild Treatments attempted:   cymbalta, lyrica, rest, ice, heat, APAP, ibuprofen, aleve, physical therapy, and HEP  Involved Joints:     Hands: yes R>L    Wrists: no      Elbows: yes right    Shoulders: yes bilateral    Back: yes     Hips: yes right    Knees: yes bilateral    Ankles: no     Feet: yes bilateral R>L   Relevant past medical, surgical, family and social history reviewed and updated as indicated. Interim medical history since our last visit reviewed. Allergies and medications reviewed and updated.  Review of Systems  Constitutional:  Positive for diaphoresis. Negative for activity change, appetite change, chills, fatigue, fever and unexpected weight change.  Respiratory: Negative.    Cardiovascular: Negative.   Gastrointestinal: Negative.   Musculoskeletal:  Positive for arthralgias, back pain, myalgias, neck pain and neck stiffness. Negative for gait problem and joint swelling.  Skin: Negative.   Neurological: Negative.   Psychiatric/Behavioral: Negative.      Per HPI unless specifically indicated above     Objective:    There were no vitals taken for this visit.  Wt Readings from  Last 3 Encounters:  04/14/22 228 lb 6.4 oz (103.6 kg)  04/01/22 228 lb (103.4 kg)  03/30/22 226 lb 12.8 oz (102.9 kg)    Physical Exam Vitals and nursing note reviewed.  Constitutional:      General: He is not in acute distress.    Appearance: Normal appearance. He is not ill-appearing, toxic-appearing or diaphoretic.  HENT:     Head: Normocephalic and atraumatic.     Right Ear: External ear normal.     Left Ear: External ear normal.     Nose: Nose normal.     Mouth/Throat:     Mouth: Mucous membranes are moist.     Pharynx: Oropharynx is clear.  Eyes:     General: No scleral icterus.       Right eye: No discharge.        Left eye: No discharge.     Conjunctiva/sclera: Conjunctivae normal.     Pupils: Pupils are equal, round, and reactive to light.  Pulmonary:     Effort: Pulmonary effort is normal. No respiratory distress.     Comments: Speaking in full sentences Musculoskeletal:        General: Normal range of motion.     Cervical back: Normal range of motion.  Skin:    Coloration: Skin is not jaundiced or pale.     Findings: No bruising, erythema, lesion or rash.  Neurological:  Mental Status: He is alert and oriented to person, place, and time. Mental status is at baseline.  Psychiatric:        Mood and Affect: Mood normal.        Behavior: Behavior normal.        Thought Content: Thought content normal.        Judgment: Judgment normal.     Results for orders placed or performed in visit on 99/35/70  Basic metabolic panel  Result Value Ref Range   Glucose 87 70 - 99 mg/dL   BUN 14 6 - 24 mg/dL   Creatinine, Ser 1.37 (H) 0.76 - 1.27 mg/dL   eGFR 64 >59 mL/min/1.73   BUN/Creatinine Ratio 10 9 - 20   Sodium 140 134 - 144 mmol/L   Potassium 4.1 3.5 - 5.2 mmol/L   Chloride 100 96 - 106 mmol/L   CO2 24 20 - 29 mmol/L   Calcium 9.6 8.7 - 10.2 mg/dL      Assessment & Plan:   Problem List Items Addressed This Visit       Other   Polyarthralgia    Mood  is much better, but pain is not significantly better. Will increase lyrica and recheck in about a month. Referral to rheumatology is pending. Call with any concerns.       Relevant Orders   Ambulatory referral to Rheumatology   Myofascial pain - Primary    Mood is much better, but pain is not significantly better. Will increase lyrica and recheck in about a month. Referral to rheumatology is pending. Call with any concerns.         Follow up plan: Return in about 4 weeks (around 06/12/2022).   This visit was completed via video visit through MyChart due to the restrictions of the COVID-19 pandemic. All issues as above were discussed and addressed. Physical exam was done as above through visual confirmation on video through MyChart. If it was felt that the patient should be evaluated in the office, they were directed there. The patient verbally consented to this visit. Location of the patient: home Location of the provider: work Those involved with this call:  Provider: Park Liter, DO CMA: Irena Reichmann, [place] Desk/Registration: [employer]  Time spent on call:  15 minutes with patient face to face via video conference. More than 50% of this time was spent in counseling and coordination of care. 23 minutes total spent in review of patient's record and preparation of their chart.

## 2022-05-15 NOTE — Assessment & Plan Note (Signed)
Mood is much better, but pain is not significantly better. Will increase lyrica and recheck in about a month. Referral to rheumatology is pending. Call with any concerns.

## 2022-05-15 NOTE — Progress Notes (Signed)
Lvm asking patient to call back to schedule an appointment

## 2022-05-19 NOTE — Progress Notes (Signed)
2nd attempt at reaching pt

## 2022-05-22 DIAGNOSIS — M2550 Pain in unspecified joint: Secondary | ICD-10-CM | POA: Diagnosis not present

## 2022-05-22 DIAGNOSIS — W57XXXA Bitten or stung by nonvenomous insect and other nonvenomous arthropods, initial encounter: Secondary | ICD-10-CM | POA: Diagnosis not present

## 2022-05-22 DIAGNOSIS — R61 Generalized hyperhidrosis: Secondary | ICD-10-CM | POA: Diagnosis not present

## 2022-05-26 DIAGNOSIS — M7732 Calcaneal spur, left foot: Secondary | ICD-10-CM | POA: Diagnosis not present

## 2022-05-26 DIAGNOSIS — M79671 Pain in right foot: Secondary | ICD-10-CM | POA: Diagnosis not present

## 2022-05-26 DIAGNOSIS — Z471 Aftercare following joint replacement surgery: Secondary | ICD-10-CM | POA: Diagnosis not present

## 2022-05-26 DIAGNOSIS — M79641 Pain in right hand: Secondary | ICD-10-CM | POA: Diagnosis not present

## 2022-05-26 DIAGNOSIS — M2550 Pain in unspecified joint: Secondary | ICD-10-CM | POA: Diagnosis not present

## 2022-05-26 DIAGNOSIS — R5382 Chronic fatigue, unspecified: Secondary | ICD-10-CM | POA: Diagnosis not present

## 2022-05-26 DIAGNOSIS — M130 Polyarthritis, unspecified: Secondary | ICD-10-CM | POA: Diagnosis not present

## 2022-05-26 DIAGNOSIS — M19072 Primary osteoarthritis, left ankle and foot: Secondary | ICD-10-CM | POA: Diagnosis not present

## 2022-05-26 DIAGNOSIS — M791 Myalgia, unspecified site: Secondary | ICD-10-CM | POA: Diagnosis not present

## 2022-05-26 DIAGNOSIS — M533 Sacrococcygeal disorders, not elsewhere classified: Secondary | ICD-10-CM | POA: Diagnosis not present

## 2022-05-26 DIAGNOSIS — M79642 Pain in left hand: Secondary | ICD-10-CM | POA: Diagnosis not present

## 2022-05-28 ENCOUNTER — Other Ambulatory Visit: Payer: Self-pay

## 2022-05-28 MED ORDER — VITAMIN D (ERGOCALCIFEROL) 1.25 MG (50000 UNIT) PO CAPS
50000.0000 [IU] | ORAL_CAPSULE | ORAL | 2 refills | Status: DC
  Filled 2022-05-28: qty 4, 28d supply, fill #0

## 2022-06-20 ENCOUNTER — Other Ambulatory Visit: Payer: Self-pay | Admitting: Family Medicine

## 2022-06-22 ENCOUNTER — Encounter: Payer: Self-pay | Admitting: Family Medicine

## 2022-06-22 NOTE — Telephone Encounter (Signed)
Requested Prescriptions  Pending Prescriptions Disp Refills   DULoxetine (CYMBALTA) 60 MG capsule [Pharmacy Med Name: DULoxetine HCl 60 MG Oral Capsule Delayed Release Particles] 90 capsule 0    Sig: Take 1 capsule by mouth once daily     Psychiatry: Antidepressants - SNRI - duloxetine Failed - 06/20/2022  9:42 AM      Failed - Cr in normal range and within 360 days    Creatinine, Ser  Date Value Ref Range Status  04/20/2022 1.37 (H) 0.76 - 1.27 mg/dL Final         Failed - Last BP in normal range    BP Readings from Last 1 Encounters:  04/14/22 (!) 138/94         Passed - eGFR is 30 or above and within 360 days    GFR calc Af Amer  Date Value Ref Range Status  05/30/2020 92 >59 mL/min/1.73 Final    Comment:    **In accordance with recommendations from the NKF-ASN Task force,**   [facility] is in the process of updating its eGFR calculation to the   2021 CKD-EPI creatinine equation that estimates kidney function   without a race variable.    GFR, Estimated  Date Value Ref Range Status  04/14/2022 >60 >60 mL/min Final    Comment:    (NOTE) Calculated using the CKD-EPI Creatinine Equation (2021)    eGFR  Date Value Ref Range Status  04/20/2022 64 >59 mL/min/1.73 Final         Passed - Completed PHQ-2 or PHQ-9 in the last 360 days      Passed - Valid encounter within last 6 months    Recent Outpatient Visits           1 month ago Myofascial pain   [place], [place], DO   2 months ago Primary hypertension   [place], [place], DO   3 months ago Routine general medical examination at a health care facility   [place], [place], DO   4 months ago Primary hypertension   [place], DO   1 year ago Left lower quadrant abdominal pain   [place], [place], DO

## 2022-06-23 MED ORDER — TRAZODONE HCL 50 MG PO TABS: ORAL_TABLET | ORAL | 0 refills | Status: DC

## 2022-08-03 ENCOUNTER — Other Ambulatory Visit: Payer: Self-pay | Admitting: Family Medicine

## 2022-08-14 ENCOUNTER — Ambulatory Visit
Admission: RE | Admit: 2022-08-14 | Discharge: 2022-08-14 | Disposition: A | Discharge status: Home / Self Care | Payer: BC Managed Care – PPO | Attending: Family Medicine | Admitting: Family Medicine

## 2022-08-14 ENCOUNTER — Ambulatory Visit
Admission: RE | Admit: 2022-08-14 | Discharge: 2022-08-14 | Disposition: A | Discharge status: Home / Self Care | Payer: BC Managed Care – PPO | Source: Ambulatory Visit | Attending: Family Medicine | Admitting: Family Medicine

## 2022-08-14 ENCOUNTER — Telehealth: Payer: Self-pay

## 2022-08-14 ENCOUNTER — Ambulatory Visit: Payer: BC Managed Care – PPO | Admitting: Family Medicine

## 2022-08-14 VITALS — BP 135/89 | HR 114 | Temp 100.1°F | Wt 234.4 lb

## 2022-08-14 DIAGNOSIS — R051 Acute cough: Secondary | ICD-10-CM | POA: Insufficient documentation

## 2022-08-14 DIAGNOSIS — R0989 Other specified symptoms and signs involving the circulatory and respiratory systems: Secondary | ICD-10-CM

## 2022-08-14 DIAGNOSIS — R509 Fever, unspecified: Secondary | ICD-10-CM | POA: Diagnosis not present

## 2022-08-14 DIAGNOSIS — R062 Wheezing: Secondary | ICD-10-CM | POA: Diagnosis not present

## 2022-08-14 LAB — VERITOR FLU A/B WAIVED
Influenza A: NEGATIVE
Influenza B: NEGATIVE

## 2022-08-14 MED ORDER — ALBUTEROL SULFATE (2.5 MG/3ML) 0.083% IN NEBU
2.5000 mg | INHALATION_SOLUTION | Freq: Once | RESPIRATORY_TRACT | Status: AC
  Administered 2022-08-14: 2.5 mg via RESPIRATORY_TRACT

## 2022-08-14 MED ORDER — ALBUTEROL SULFATE (2.5 MG/3ML) 0.083% IN NEBU: 2.5000 mg | INHALATION_SOLUTION | Freq: Once | RESPIRATORY_TRACT | Status: DC

## 2022-08-14 MED ORDER — AMOXICILLIN 500 MG PO CAPS: 500.0000 mg | ORAL_CAPSULE | Freq: Three times a day (TID) | ORAL | 0 refills | Status: DC

## 2022-08-14 MED ORDER — PREDNISONE 10 MG PO TABS: ORAL_TABLET | ORAL | 0 refills | Status: DC

## 2022-08-14 MED ORDER — METHYLPREDNISOLONE SODIUM SUCC 40 MG IJ SOLR
40.0000 mg | Freq: Once | INTRAMUSCULAR | Status: AC
  Administered 2022-08-14: 40 mg via INTRAMUSCULAR

## 2022-08-14 NOTE — Progress Notes (Signed)
BP 135/89   Pulse (!) 114   Temp 100.1 F (37.8 C) (Oral)   Wt 234 lb 6.4 oz (106.3 kg)   SpO2 95%   BMI 30.51 kg/m    Subjective:    Patient ID: John Huang, male    DOB: 15-Feb-1976, 47 y.o.   MRN: [id number]  HPI: John Huang is a 47 y.o. male  Chief Complaint  Patient presents with   URI    Pt states he has been having a cough, congestion, chills, and body aches for the past 3 days.    RESPIRATORY INFECTION Started off as a post nasal drip cough and headache 3 days ago, he has never had COVID, but lives with daughter who tested COVID + 4 days ago.  Worst symptoms: Shortness of breath, pulse ox 95% today.  Duration: 3 days Onset: gradual Description of breathing discomfort:  Severity: severe Episode duration:  Related to exertion: no Cough: yes productive Chest tightness: yes Wheezing: yes Sore throat: no Swollen glands: no Sinus pressure: no Headache: yes Face pain: no Toothache: no Ear pain: no  Ear pressure: no  Eyes red/itching:no Rash: no Fatigue: yes Strep contacts: no  Nasal congestion: no Fevers: yes Chest pain: no Palpitations: no  Nausea/vomiting: no Diaphoresis: yes Status: worse Rhinorrhea: No Headache: yes Sick Exposures: 51 year old daughter COVID + 4 days ago, share the same household.  Aggravating factors: Laying down at night Alleviating factors: None Treatments attempted:  Mucinex, tylenol     Relevant past medical, surgical, family and social history reviewed and updated as indicated. Interim medical history since our last visit reviewed. Allergies and medications reviewed and updated.  Review of Systems  Constitutional:  Positive for chills, diaphoresis, fatigue and fever.  HENT:  Positive for congestion, postnasal drip and sneezing. Negative for ear pain, rhinorrhea, sinus pressure, sinus pain and sore throat.   Eyes:  Negative for discharge, redness and itching.  Respiratory:  Positive for cough, chest tightness,  shortness of breath and wheezing.   Cardiovascular:  Negative for chest pain.  Gastrointestinal:  Negative for nausea and vomiting.  Genitourinary: Negative.   Musculoskeletal: Negative.   Skin:  Negative for rash.  Neurological:  Positive for headaches.    Per HPI unless specifically indicated above     Objective:    BP 135/89   Pulse (!) 114   Temp 100.1 F (37.8 C) (Oral)   Wt 234 lb 6.4 oz (106.3 kg)   SpO2 95%   BMI 30.51 kg/m   Wt Readings from Last 3 Encounters:  08/14/22 234 lb 6.4 oz (106.3 kg)  04/14/22 228 lb 6.4 oz (103.6 kg)  04/01/22 228 lb (103.4 kg)    Physical Exam Vitals and nursing note reviewed.  Constitutional:      General: He is awake. He is not in acute distress.    Appearance: Normal appearance. He is well-developed and well-groomed. He is obese. He is ill-appearing. He is not toxic-appearing or diaphoretic.  HENT:     Head: Normocephalic and atraumatic.     Right Ear: Hearing, tympanic membrane, ear canal and external ear normal. No drainage. There is no impacted cerumen.     Left Ear: Hearing, tympanic membrane, ear canal and external ear normal. No drainage. There is no impacted cerumen.     Nose: Nose normal.     Right Turbinates: Enlarged and swollen.     Left Turbinates: Enlarged and swollen.     Mouth/Throat:  Mouth: Mucous membranes are moist.     Pharynx: Pharyngeal swelling and posterior oropharyngeal erythema present. No oropharyngeal exudate.  Eyes:     General: Lids are normal. No scleral icterus.       Right eye: No discharge.        Left eye: No discharge.     Extraocular Movements: Extraocular movements intact.     Conjunctiva/sclera: Conjunctivae normal.     Pupils: Pupils are equal, round, and reactive to light.  Neck:     Vascular: No carotid bruit.  Cardiovascular:     Rate and Rhythm: Tachycardia present. Rhythm irregular.     Pulses: Normal pulses.     Heart sounds: Normal heart sounds, S1 normal and S2 normal. No  murmur heard.    No friction rub. No gallop.  Pulmonary:     Effort: Tachypnea present. No accessory muscle usage or respiratory distress.     Breath sounds: No stridor. Wheezing and rales present. No rhonchi.     Comments: Congested cough Chest:     Chest wall: No tenderness.  Musculoskeletal:        General: Normal range of motion.     Cervical back: Full passive range of motion without pain, normal range of motion and neck supple. No rigidity. No muscular tenderness.     Right lower leg: No edema.     Left lower leg: No edema.  Lymphadenopathy:     Cervical: No cervical adenopathy.  Skin:    General: Skin is warm and dry.     Capillary Refill: Capillary refill takes less than 2 seconds.     Coloration: Skin is not jaundiced or pale.     Findings: No bruising, erythema, lesion or rash.  Neurological:     General: No focal deficit present.     Mental Status: He is alert and oriented to person, place, and time. Mental status is at baseline.     Cranial Nerves: No cranial nerve deficit.     Sensory: No sensory deficit.     Motor: No weakness.     Coordination: Coordination normal.     Gait: Gait normal.     Deep Tendon Reflexes: Reflexes normal.  Psychiatric:        Attention and Perception: Attention normal.        Mood and Affect: Mood normal.        Speech: Speech normal.        Behavior: Behavior normal. Behavior is cooperative.        Thought Content: Thought content normal.        Judgment: Judgment normal.     Results for orders placed or performed in visit on 08/14/22  Veritor Flu A/B Waived  Result Value Ref Range   Influenza A Negative Negative   Influenza B Negative Negative      Assessment & Plan:   Problem List Items Addressed This Visit       Respiratory   Pulmonary congestion    Acute, ongoing. Awaiting COVID results, Flu negative. Albuterol nebulizer given in office today along with 40mg  solumedrol injection. STAT chest xray ordered. Prescribed  Amoxicillin 500mg  TID for 7 days, Prednisone 12 day taper given. Encouraged rest and hydration. Follow up in 1 week for recheck of lung sounds and to see if feeling better.       Relevant Medications   amoxicillin (AMOXIL) 500 MG capsule   predniSONE (DELTASONE) 10 MG tablet   Other Relevant Orders   DG  Chest 2 View     Other   Acute cough - Primary   Relevant Orders   Veritor Flu A/B Waived (Completed)   Novel Coronavirus, NAA ([facility])   DG Chest 2 View     Follow up plan: Return in about 1 week (around 08/21/2022) for follow up for lung congestion.

## 2022-08-14 NOTE — Assessment & Plan Note (Addendum)
Acute, ongoing. Awaiting COVID results, Flu negative. Albuterol nebulizer given in office today along with 40mg  solumedrol injection. STAT chest xray ordered. Prescribed Amoxicillin 500mg  TID for 7 days, Prednisone 12 day taper given. Encouraged rest and hydration. Follow up in 1 week for recheck of lung sounds and to see if feeling better.

## 2022-08-14 NOTE — Addendum Note (Signed)
Addended by: Valerie Roys on: 08/14/2022 01:29 PM   Modules accepted: Orders

## 2022-08-14 NOTE — Telephone Encounter (Signed)
Received call from [facility] radiology. Order for chest  x-ray is not signed.  Pt is at facility. Please return call to [facility] radiology. Angie [phone] Please advise.

## 2022-08-15 LAB — NOVEL CORONAVIRUS, NAA: SARS-CoV-2, NAA: DETECTED — AB

## 2022-08-17 ENCOUNTER — Other Ambulatory Visit: Payer: Self-pay | Admitting: Family Medicine

## 2022-08-17 MED ORDER — NIRMATRELVIR/RITONAVIR (PAXLOVID)TABLET: 3.0000 | ORAL_TABLET | Freq: Two times a day (BID) | ORAL | 0 refills | Status: AC

## 2022-08-21 ENCOUNTER — Encounter: Payer: Self-pay | Admitting: Family Medicine

## 2022-08-21 ENCOUNTER — Ambulatory Visit: Payer: BC Managed Care – PPO | Admitting: Family Medicine

## 2022-08-21 VITALS — BP 134/89 | HR 69 | Temp 98.4°F | Ht 73.5 in | Wt 234.1 lb

## 2022-08-21 DIAGNOSIS — I10 Essential (primary) hypertension: Secondary | ICD-10-CM | POA: Diagnosis not present

## 2022-08-21 DIAGNOSIS — M2550 Pain in unspecified joint: Secondary | ICD-10-CM

## 2022-08-21 DIAGNOSIS — F419 Anxiety disorder, unspecified: Secondary | ICD-10-CM

## 2022-08-21 DIAGNOSIS — U071 COVID-19: Secondary | ICD-10-CM

## 2022-08-21 MED ORDER — TRAZODONE HCL 50 MG PO TABS: ORAL_TABLET | ORAL | 1 refills | Status: DC

## 2022-08-21 MED ORDER — PREGABALIN 150 MG PO CAPS: 150.0000 mg | ORAL_CAPSULE | Freq: Three times a day (TID) | ORAL | 1 refills | Status: DC

## 2022-08-21 MED ORDER — AMLODIPINE BESYLATE 5 MG PO TABS: 5.0000 mg | ORAL_TABLET | Freq: Every day | ORAL | 1 refills | Status: DC

## 2022-08-21 MED ORDER — DULOXETINE HCL 60 MG PO CPEP: 60.0000 mg | ORAL_CAPSULE | Freq: Every day | ORAL | 1 refills | Status: DC

## 2022-08-21 NOTE — Progress Notes (Signed)
BP 134/89   Pulse 69   Temp 98.4 F (36.9 C) (Oral)   Ht 6' 1.5" (1.867 m)   Wt 234 lb 1.6 oz (106.2 kg)   SpO2 98%   BMI 30.47 kg/m    Subjective:    Patient ID: John Huang, male    DOB: 10/03/1975, 47 y.o.   MRN: [id number]  HPI: John OdorJoseph M Bieker is a 47 y.o. male  Chief Complaint  Patient presents with  . Cough  . Wheezing    Patient says he still feeling some wheezing when he is breathing. Patient says he has a weird throat pain and he is not sure if it is related to the viral infection.   . Generalized Body Aches    Patient says he is noticing some over all body aches and says it is weird. Patient says he is not sure if it is related to the viral infection as well.    Feeling much better with his COVID  HYPERTENSION  Hypertension status: {Blank single:19197::"controlled","uncontrolled","better","worse","exacerbated","stable"}  Satisfied with current treatment? {Blank single:19197::"yes","no"} Duration of hypertension: {Blank single:19197::"chronic","months","years"} BP monitoring frequency:  {Blank single:19197::"not checking","rarely","daily","weekly","monthly","a few times a day","a few times a week","a few times a month"} BP range:  BP medication side effects:  {Blank single:19197::"yes","no"} Medication compliance: {Blank single:19197::"excellent compliance","good compliance","fair compliance","poor compliance"} Previous BP meds:{Blank multiple:19196::"none","amlodipine","amlodipine/benazepril","atenolol","benazepril","benazepril/HCTZ","bisoprolol (bystolic)","carvedilol","chlorthalidone","clonidine","diltiazem","exforge HCT","HCTZ","irbesartan (avapro)","labetalol","lisinopril","lisinopril-HCTZ","losartan (cozaar)","methyldopa","nifedipine","olmesartan (benicar)","olmesartan-HCTZ","quinapril","ramipril","spironalactone","tekturna","valsartan","valsartan-HCTZ","verapamil"} Aspirin: {Blank single:19197::"yes","no"} Recurrent headaches: {Blank  single:19197::"yes","no"} Visual changes: {Blank single:19197::"yes","no"} Palpitations: {Blank single:19197::"yes","no"} Dyspnea: {Blank single:19197::"yes","no"} Chest pain: {Blank single:19197::"yes","no"} Lower extremity edema: {Blank single:19197::"yes","no"} Dizzy/lightheaded: {Blank single:19197::"yes","no"}  CHRONIC PAIN  Present dose:  Morphine equivalents Pain control status: {Blank single:19197::"controlled","uncontrolled","better","worse","exacerbated","stable"} Duration: {Blank single:19197::"chronic","months","years"} Location:  Quality: {Blank multiple:19196::"sharp","dull","aching","burning","cramping","ill-defined","itchy","pressure-like","pulling","shooting","sore","stabbing","tender","tearing","throbbing"} Current Pain Level: {Blank single:19197::"mild","moderate","severe","1/10","2/10","3/10","4/10","5/10","6/10","7/10","8/10","9/10","10/10"} Previous Pain Level: {Blank single:19197::"mild","moderate","severe","1/10","2/10","3/10","4/10","5/10","6/10","7/10","8/10","9/10","10/10"} Breakthrough pain: {Blank single:19197::"yes","no"} Benefit from narcotic medications: {Blank single:19197::"yes","no"} What Activities task can be accomplished with current medication? Interested in weaning off narcotics:{Blank single:19197::"yes","no"}   Stool softners/OTC fiber: {Blank single:19197::"yes","no"}  Previous pain specialty evaluation: {Blank single:19197::"yes","no"} Non-narcotic analgesic meds: {Blank single:19197::"yes","no"} Narcotic contract: {Blank single:19197::"yes","no"}  ANXIETY/STRESS Duration:{Blank single:19197::"controlled","uncontrolled","better","worse","exacerbated","stable"} Anxious mood: {Blank single:19197::"yes","no"}  Excessive worrying: {Blank single:19197::"yes","no"} Irritability: {Blank single:19197::"yes","no"}  Sweating: {Blank single:19197::"yes","no"} Nausea: {Blank single:19197::"yes","no"} Palpitations:{Blank  single:19197::"yes","no"} Hyperventilation: {Blank single:19197::"yes","no"} Panic attacks: {Blank single:19197::"yes","no"} Agoraphobia: {Blank single:19197::"yes","no"}  Obscessions/compulsions: {Blank single:19197::"yes","no"} Depressed mood: {Blank single:19197::"yes","no"}    08/21/2022   10:49 AM 05/15/2022   10:12 AM 03/30/2022    9:03 AM 03/02/2022   11:19 AM 02/02/2022   10:28 AM  Depression screen PHQ 2/9  Decreased Interest 0 0 0 0 0  Down, Depressed, Hopeless 0 0 0 0 0  PHQ - 2 Score 0 0 0 0 0  Altered sleeping 0 0 0 0 0  Tired, decreased energy 0 0 0 0 0  Change in appetite 0 0 0 0 0  Feeling bad or failure about yourself  0 0 0 0 0  Trouble concentrating 0 0 0 0 0  Moving slowly or fidgety/restless 0 0 0 0 0  Suicidal thoughts 0 0 0 0 0  PHQ-9 Score 0 0 0 0 0  Difficult doing work/chores Not difficult at all Not difficult at all Not difficult at all  Not difficult at all   Anhedonia: {Blank single:19197::"yes","no"} Weight changes: {Blank single:19197::"yes","no"} Insomnia: {Blank single:19197::"yes","no"} {Blank single:19197::"hard to fall asleep","hard to stay asleep"}  Hypersomnia: {Blank single:19197::"yes","no"} Fatigue/loss of energy: {Blank single:19197::"yes","no"} Feelings of worthlessness: {Blank single:19197::"yes","no"} Feelings of guilt: {Blank single:19197::"yes","no"} Impaired concentration/indecisiveness: {Blank single:19197::"yes","no"} Suicidal ideations: {Blank single:19197::"yes","no"}  Crying spells: {Blank single:19197::"yes","no"} Recent Stressors/Life Changes: {  Blank single:19197::"yes","no"}   Relationship problems: {Blank single:19197::"yes","no"}   Family stress: {Blank single:19197::"yes","no"}     Financial stress: {Blank single:19197::"yes","no"}    Job stress: {Blank single:19197::"yes","no"}    Recent death/loss: {Blank single:19197::"yes","no"}   Relevant past medical, surgical, family and social history reviewed and updated as  indicated. Interim medical history since our last visit reviewed. Allergies and medications reviewed and updated.  Review of Systems  Constitutional: Negative.   HENT: Negative.    Respiratory:  Positive for cough. Negative for apnea, choking, chest tightness, shortness of breath, wheezing and stridor.   Cardiovascular: Negative.   Musculoskeletal:  Positive for arthralgias and myalgias. Negative for back pain, gait problem, joint swelling, neck pain and neck stiffness.  Skin: Negative.   Neurological: Negative.   Psychiatric/Behavioral: Negative.      Per HPI unless specifically indicated above     Objective:    BP 134/89   Pulse 69   Temp 98.4 F (36.9 C) (Oral)   Ht 6' 1.5" (1.867 m)   Wt 234 lb 1.6 oz (106.2 kg)   SpO2 98%   BMI 30.47 kg/m   Wt Readings from Last 3 Encounters:  08/21/22 234 lb 1.6 oz (106.2 kg)  08/14/22 234 lb 6.4 oz (106.3 kg)  04/14/22 228 lb 6.4 oz (103.6 kg)    Physical Exam  Results for orders placed or performed in visit on 08/14/22  Novel Coronavirus, NAA ([facility])   Specimen: Nasopharyngeal(NP) swabs in vial transport medium  Result Value Ref Range   SARS-CoV-2, NAA Detected (A) Not Detected  Veritor Flu A/B Waived  Result Value Ref Range   Influenza A Negative Negative   Influenza B Negative Negative      Assessment & Plan:   Problem List Items Addressed This Visit   None Visit Diagnoses     COVID    -  Primary        Follow up plan: No follow-ups on file.

## 2022-08-22 LAB — BASIC METABOLIC PANEL
BUN/Creatinine Ratio: 10 (ref 9–20)
BUN: 10 mg/dL (ref 6–24)
CO2: 27 mmol/L (ref 20–29)
Calcium: 9.7 mg/dL (ref 8.7–10.2)
Chloride: 98 mmol/L (ref 96–106)
Creatinine, Ser: 1.01 mg/dL (ref 0.76–1.27)
Glucose: 66 mg/dL — ABNORMAL LOW (ref 70–99)
Potassium: 5 mmol/L (ref 3.5–5.2)
Sodium: 141 mmol/L (ref 134–144)
eGFR: 92 mL/min/{1.73_m2} (ref >59)

## 2022-08-22 NOTE — Assessment & Plan Note (Signed)
Under good control on current regimen. Continue current regimen. Continue to monitor. Call with any concerns. Refills given. Labs drawn today.   

## 2022-09-01 ENCOUNTER — Encounter: Payer: Self-pay | Admitting: Family Medicine

## 2022-11-18 ENCOUNTER — Encounter: Payer: Self-pay | Admitting: Family Medicine

## 2022-11-30 ENCOUNTER — Encounter: Payer: Self-pay | Admitting: Family Medicine

## 2023-01-20 ENCOUNTER — Ambulatory Visit: Payer: Self-pay | Admitting: *Deleted

## 2023-01-20 NOTE — Telephone Encounter (Signed)
  Chief Complaint: diarrhea, abdominal pain Symptoms: diarrhea- moderate, abdominal pian- precursor to BM Frequency: over 1 week- started last Monday Pertinent Negatives: Patient denies fever, blood in stool Disposition: [] ED /[] Urgent Care (no appt availability in office) / [x] Appointment(In office/virtual)/ []  [facility]/ [] Home Care/ [] Refused Recommended Disposition /[] [facility]/ []  Follow-up with PCP Additional Notes: Patient has been scheduled for appointment- care advised reviewed.   Reason for Disposition  [1] MODERATE diarrhea (e.g., 4-6 times / day more than normal) AND [2] present > 48 hours (2 days)  Answer Assessment - Initial Assessment Questions 1. DIARRHEA SEVERITY: "How bad is the diarrhea?" "How many more stools have you had in the past 24 hours than normal?"    - NO DIARRHEA (SCALE 0)   - MILD (SCALE 1-3): Few loose or mushy BMs; increase of 1-3 stools over normal daily number of stools; mild increase in ostomy output.   -  MODERATE (SCALE 4-7): Increase of 4-6 stools daily over normal; moderate increase in ostomy output.   -  SEVERE (SCALE 8-10; OR "WORST POSSIBLE"): Increase of 7 or more stools daily over normal; moderate increase in ostomy output; incontinence.     Mild- to moderate 2. ONSET: "When did the diarrhea begin?"      Last week- last Monday 3. BM CONSISTENCY: "How loose or watery is the diarrhea?"      Runny stool 4. VOMITING: "Are you also vomiting?" If Yes, ask: "How many times in the past 24 hours?"      Started last Monday- burping lead to vomiting 5. ABDOMEN PAIN: "Are you having any abdomen pain?" If Yes, ask: "What does it feel like?" (e.g., crampy, dull, intermittent, constant)      Yes- sharp pain- precursor to BM 6. ABDOMEN PAIN SEVERITY: If present, ask: "How bad is the pain?"  (e.g., Scale 1-10; mild, moderate, or severe)   - MILD (1-3): doesn't interfere with normal activities, abdomen soft and not tender to touch    -  MODERATE (4-7): interferes with normal activities or awakens from sleep, abdomen tender to touch    - SEVERE (8-10): excruciating pain, doubled over, unable to do any normal activities       Severe-eases after BM 7. ORAL INTAKE: If vomiting, "Have you been able to drink liquids?" "How much liquids have you had in the past 24 hours?"     Eating regularly- will "pay for it" 8. HYDRATION: "Any signs of dehydration?" (e.g., dry mouth [not just dry lips], too weak to stand, dizziness, new weight loss) "When did you last urinate?"     Trying to keep hydrated- using liquid IV 9. EXPOSURE: "Have you traveled to a foreign country recently?" "Have you been exposed to anyone with diarrhea?" "Could you have eaten any food that was spoiled?"     No- 2 months ago noticed change- more sensitive 10. ANTIBIOTIC USE: "Are you taking antibiotics now or have you taken antibiotics in the past 2 months?"       No- previous "gut cleanse" - nothing recently 11. OTHER SYMPTOMS: "Do you have any other symptoms?" (e.g., fever, blood in stool)       Chills- no fever  Protocols used: Diarrhea-A-AH

## 2023-01-21 ENCOUNTER — Encounter: Payer: Self-pay | Admitting: Physician Assistant

## 2023-01-21 ENCOUNTER — Ambulatory Visit: Payer: BC Managed Care – PPO | Admitting: Physician Assistant

## 2023-01-21 VITALS — BP 139/93 | HR 70 | Ht 73.5 in | Wt 225.4 lb

## 2023-01-21 DIAGNOSIS — R197 Diarrhea, unspecified: Secondary | ICD-10-CM | POA: Diagnosis not present

## 2023-01-21 DIAGNOSIS — R1084 Generalized abdominal pain: Secondary | ICD-10-CM | POA: Diagnosis not present

## 2023-01-21 DIAGNOSIS — M546 Pain in thoracic spine: Secondary | ICD-10-CM

## 2023-01-21 DIAGNOSIS — K219 Gastro-esophageal reflux disease without esophagitis: Secondary | ICD-10-CM | POA: Diagnosis not present

## 2023-01-21 MED ORDER — SUCRALFATE 1 G PO TABS
1.0000 g | ORAL_TABLET | Freq: Three times a day (TID) | ORAL | 0 refills | Status: DC
Start: 2023-01-21 — End: 2023-02-22

## 2023-01-21 MED ORDER — PANTOPRAZOLE SODIUM 40 MG PO TBEC
40.0000 mg | DELAYED_RELEASE_TABLET | Freq: Every day | ORAL | 3 refills | Status: DC
Start: 2023-01-21 — End: 2023-03-19

## 2023-01-21 MED ORDER — METHOCARBAMOL 750 MG PO TABS
750.0000 mg | ORAL_TABLET | Freq: Three times a day (TID) | ORAL | 0 refills | Status: DC | PRN
Start: 2023-01-21 — End: 2023-02-22

## 2023-01-21 NOTE — Assessment & Plan Note (Signed)
Chronic, historic condition, currently exacerbated Patient reports eating a salad last week that likely triggered current exacerbation of GERD symptoms. He has been taking omeprazole daily since he ran out of Protonix but states that this is not doing much to assist with symptoms Will send in new prescription of Protonix as well as sucralfate to assist with current flare Recommend that he continues with dietary changes and avoids trigger foods at this time Patient will potentially benefit from H. pylori testing but given his prolonged use of PPI this will be difficult to obtain Recommend follow-up as needed for persistent or progressing symptoms, patient has GI appointment in October-will defer to their recommendations after he is evaluated

## 2023-01-21 NOTE — Progress Notes (Signed)
Acute Office Visit   Patient: John Huang   DOB: 1975/12/08   47 y.o. Male  MRN: [id number] Visit Date: 01/21/2023  Today's healthcare provider: Oswaldo Conroy Hiral Lukasiewicz, PA-C  Introduced myself to the patient as a Secondary school teacher and provided education on APPs in clinical practice.    Chief Complaint  Patient presents with   Diarrhea    Patient says Monday before last, he was eating a salad and after eating the salad. He has vomiting. Patient says since that night, he has had diarrhea. Patient says he is going on day 10 with sharp pains in his abdomen. Patient says he has tried over Pepto, Charcoal Cap and Tums. Patient says from the symptoms, he has pulled a muscle in his back. Patient feels nothing is helping. Patient is schedule with GI 02/22/23.    Abdominal Pain   Back Pain   Bloated   Subjective    HPI HPI     Diarrhea    Additional comments: Patient says Monday before last, he was eating a salad and after eating the salad. He has vomiting. Patient says since that night, he has had diarrhea. Patient says he is going on day 10 with sharp pains in his abdomen. Patient says he has tried over Pepto, Charcoal Cap and Tums. Patient says from the symptoms, he has pulled a muscle in his back. Patient feels nothing is helping. Patient is schedule with GI 02/22/23.       Last edited by Malen Gauze, CMA on 01/21/2023  8:52 AM.       Abdominal pain and diarrhea  He reports that earlier this year he noticed a change in his GI health- states he started to have issues with eating citrus and spicy foods  Last week he had a salad with vinegarette and this caused GI upset He states he started to have belching with sulfur taste and vomiting which progressed to include diarrhea He states the diarrhea has continued to today but has slowed down (he states he is only having about one bowel movement per day at this point)  He states he is also having joint pain now  He states he also has some back  pain now and reports some pain with laughing and bearing down   Onset: sudden  Duration: about 2 weeks  Associated symptoms: diarrhea, vomiting  Interventions: he has been doing bland diet since he started feeling bad, has been taking Ibuprofen for back pain He is taking Omeprazole as well  Able to tolerate PO intake: He has been drinking plenty of water and was drinking gatorade but has stopped due to sugar     Medications: Outpatient Medications Prior to Visit  Medication Sig   amLODipine (NORVASC) 5 MG tablet Take 1 tablet (5 mg total) by mouth daily.   DULoxetine (CYMBALTA) 60 MG capsule Take 1 capsule (60 mg total) by mouth daily.   fexofenadine (ALLEGRA) 180 MG tablet Take 1-2 tablets (180-360 mg total) by mouth daily.   Multiple Vitamin (MULTI-VITAMINS) TABS Take by mouth.   pregabalin (LYRICA) 150 MG capsule Take 1 capsule (150 mg total) by mouth 3 (three) times daily.   traZODone (DESYREL) 50 MG tablet TAKE 1/2 TO 1 (ONE-HALF TO ONE) TABLET BY MOUTH AT BEDTIME AS NEEDED FOR SLEEP   [DISCONTINUED] pantoprazole (PROTONIX) 40 MG tablet Take 1 tablet (40 mg total) by mouth at bedtime.   predniSONE (DELTASONE) 10 MG tablet Take 60 mg on  day 1 and 2, Take 50 mg on day 3 and 4, Take 40mg  on day 5 and 6, Take 30mg  on day 7 and 8, Take 20mg  on day 9 and 10, Take 10mg  on day 11 and 12. (Patient not taking: Reported on 01/21/2023)   No facility-administered medications prior to visit.    Review of Systems  Constitutional:  Positive for chills and diaphoresis. Negative for fatigue and fever.  Gastrointestinal:  Positive for abdominal distention (reports bloating), abdominal pain, diarrhea, nausea and vomiting. Negative for blood in stool and constipation.  Musculoskeletal:  Positive for arthralgias.        Objective    BP (!) 139/93   Pulse 70   Ht 6' 1.5" (1.867 m)   Wt 225 lb 6.4 oz (102.2 kg)   SpO2 97%   BMI 29.33 kg/m     Physical Exam Vitals reviewed.  Constitutional:       General: He is awake.     Appearance: Normal appearance. He is well-developed and well-groomed.  HENT:     Head: Normocephalic and atraumatic.  Pulmonary:     Effort: Pulmonary effort is normal.  Abdominal:     General: Abdomen is flat. Bowel sounds are increased.     Palpations: Abdomen is soft.     Tenderness: There is generalized abdominal tenderness.  Neurological:     Mental Status: He is alert.  Psychiatric:        Attention and Perception: Attention and perception normal.        Mood and Affect: Affect normal. Mood is anxious.        Speech: Speech normal.        Behavior: Behavior normal. Behavior is cooperative.       No results found for any visits on 01/21/23.  Assessment & Plan      No follow-ups on file.      Problem List Items Addressed This Visit       Digestive   GERD (gastroesophageal reflux disease)    Chronic, historic condition, currently exacerbated Patient reports eating a salad last week that likely triggered current exacerbation of GERD symptoms. He has been taking omeprazole daily since he ran out of Protonix but states that this is not doing much to assist with symptoms Will send in new prescription of Protonix as well as sucralfate to assist with current flare Recommend that he continues with dietary changes and avoids trigger foods at this time Patient will potentially benefit from H. pylori testing but given his prolonged use of PPI this will be difficult to obtain Recommend follow-up as needed for persistent or progressing symptoms, patient has GI appointment in October-will defer to their recommendations after he is evaluated      Relevant Medications   pantoprazole (PROTONIX) 40 MG tablet   sucralfate (CARAFATE) 1 g tablet   Other Visit Diagnoses     Generalized abdominal pain    -  Primary Acute, new concern Patient reports generalized abdominal pain, resolved nausea and vomiting, resolving diarrhea since last week Suspect  generalized abdominal pain is likely due to GI irritation from p.o. intake last week as well as GERD flare.  I cannot rule out infectious etiology at this time but given his resolving symptoms I do not think testing is required Recommend he continues with bland diet with gradual retraction of normal foods, will treat GERD flare as indicated above, will get CBC, CMP, alpha gal panel to further assist with evaluation Reviewed ED and return  precautions Follow-up as needed for progressing or persistent symptoms   Relevant Medications   pantoprazole (PROTONIX) 40 MG tablet   sucralfate (CARAFATE) 1 g tablet   Other Relevant Orders   Comp Met (CMET)   CBC w/Diff   Alpha-Gal Panel   Acute left-sided thoracic back pain     Acute, new concern Suspect this is likely secondary to GI symptoms, could potentially be muscular strain from vomiting and diarrhea Will provide prescription for Robaxin to assist with pain.  He is already taking Lyrica and was taking NSAIDs but just did that he stopped NSAIDs in favor of Tylenol to avoid further GI upset Follow-up as needed for progressing or persistent symptoms   Relevant Medications   methocarbamol (ROBAXIN-750) 750 MG tablet   sucralfate (CARAFATE) 1 g tablet   Diarrhea, unspecified type     Acute, resolving Unsure if diarrhea was infectious versus triggered by GERD flare Recommend he continues with bland diet with gradual reintroduction of normal diet as tolerated Recommend he continues with increased hydration efforts and we will send prescription in for Protonix and sucralfate Reviewed ED and return precautions Follow-up as needed for progressing or persistent symptoms   Relevant Orders   Comp Met (CMET)   CBC w/Diff   Alpha-Gal Panel        No follow-ups on file.   I, Kashmere Staffa E Magic Mohler, PA-C, have reviewed all documentation for this visit. The documentation on 01/21/23 for the exam, diagnosis, procedures, and orders are all accurate and  complete.   Jacquelin Hawking, MHS, PA-C [facility]

## 2023-01-25 LAB — CBC WITH DIFFERENTIAL/PLATELET
Basophils Absolute: 0.1 10*3/uL (ref 0.0–0.2)
Basos: 1 %
EOS (ABSOLUTE): 0.3 10*3/uL (ref 0.0–0.4)
Eos: 6 %
Hematocrit: 43.7 % (ref 37.5–51.0)
Hemoglobin: 14.4 g/dL (ref 13.0–17.7)
Immature Grans (Abs): 0 10*3/uL (ref 0.0–0.1)
Immature Granulocytes: 0 %
Lymphocytes Absolute: 1.8 10*3/uL (ref 0.7–3.1)
Lymphs: 40 %
MCH: 29.4 pg (ref 26.6–33.0)
MCHC: 33 g/dL (ref 31.5–35.7)
MCV: 89 fL (ref 79–97)
Monocytes Absolute: 0.4 10*3/uL (ref 0.1–0.9)
Monocytes: 9 %
Neutrophils Absolute: 2 10*3/uL (ref 1.4–7.0)
Neutrophils: 44 %
Platelets: 259 10*3/uL (ref 150–450)
RBC: 4.89 x10E6/uL (ref 4.14–5.80)
RDW: 14.3 % (ref 11.6–15.4)
WBC: 4.6 10*3/uL (ref 3.4–10.8)

## 2023-01-25 LAB — ALPHA-GAL PANEL
Allergen Lamb IgE: <0.1 kU/L
Beef IgE: <0.1 kU/L
IgE (Immunoglobulin E), Serum: 178 [IU]/mL (ref 6–495)
O215-IgE Alpha-Gal: <0.1 kU/L
Pork IgE: <0.1 kU/L

## 2023-01-25 LAB — COMPREHENSIVE METABOLIC PANEL
ALT: 23 IU/L (ref 0–44)
AST: 19 IU/L (ref 0–40)
Albumin: 4.5 g/dL (ref 4.1–5.1)
Alkaline Phosphatase: 74 IU/L (ref 44–121)
BUN/Creatinine Ratio: 11 (ref 9–20)
BUN: 12 mg/dL (ref 6–24)
Bilirubin Total: <0.2 mg/dL (ref 0.0–1.2)
CO2: 24 mmol/L (ref 20–29)
Calcium: 9.5 mg/dL (ref 8.7–10.2)
Chloride: 106 mmol/L (ref 96–106)
Creatinine, Ser: 1.07 mg/dL (ref 0.76–1.27)
Globulin, Total: 2.4 g/dL (ref 1.5–4.5)
Glucose: 82 mg/dL (ref 70–99)
Potassium: 4.5 mmol/L (ref 3.5–5.2)
Sodium: 143 mmol/L (ref 134–144)
Total Protein: 6.9 g/dL (ref 6.0–8.5)
eGFR: 86 mL/min/{1.73_m2} (ref >59)

## 2023-01-27 NOTE — Progress Notes (Signed)
Labs are normal/stable.

## 2023-02-05 DIAGNOSIS — M1712 Unilateral primary osteoarthritis, left knee: Secondary | ICD-10-CM | POA: Diagnosis not present

## 2023-02-05 DIAGNOSIS — Z23 Encounter for immunization: Secondary | ICD-10-CM | POA: Diagnosis not present

## 2023-02-05 DIAGNOSIS — M7711 Lateral epicondylitis, right elbow: Secondary | ICD-10-CM | POA: Diagnosis not present

## 2023-02-22 ENCOUNTER — Ambulatory Visit: Payer: BC Managed Care – PPO | Admitting: Gastroenterology

## 2023-02-22 ENCOUNTER — Encounter: Payer: Self-pay | Admitting: Gastroenterology

## 2023-02-22 VITALS — BP 138/84 | HR 84 | Temp 98.6°F | Ht 73.5 in | Wt 231.0 lb

## 2023-02-22 DIAGNOSIS — K580 Irritable bowel syndrome with diarrhea: Secondary | ICD-10-CM

## 2023-02-22 DIAGNOSIS — K599 Functional intestinal disorder, unspecified: Secondary | ICD-10-CM | POA: Diagnosis not present

## 2023-02-22 DIAGNOSIS — R197 Diarrhea, unspecified: Secondary | ICD-10-CM | POA: Diagnosis not present

## 2023-02-22 DIAGNOSIS — R1110 Vomiting, unspecified: Secondary | ICD-10-CM | POA: Diagnosis not present

## 2023-02-22 NOTE — Progress Notes (Signed)
Primary Care Physician: Dorcas Carrow, DO  Primary Gastroenterologist:  Dr. Midge Minium  Chief Complaint  Patient presents with   Emesis   Diarrhea    HPI: John Huang is a 47 y.o. male who has seen me in the past for colonoscopy with the findings documented that there was no sign of inflammatory bowel disease.  Patient had seen his primary care provider and reports that he had eaten a salad and started to vomit.  Shortly after that the patient started to have diarrhea with abdominal pain that he reported to be sharp in nature.  He has tried Pepto-Bismol, charcoal capsules and Tums.  It was reported that the patient had generalized abdominal pain and by his PCP's note the nausea and vomiting have resolved and the diarrhea was improving.  The patient was put on a bland diet. Patient reports that he has been under a lot of stress he is also had a lot of diarrhea with him seeking help from a holistic practitioner who tested the patient still continues to be unwell.  The patient reports that he vomiting that comes up nowhere had to excuse himself from a conversation with a friend last week.  He does not vomit every day.   Past Medical History:  Diagnosis Date   Allergy 03/07/1995   Asthma    when 47 yrs old, years ago   Bulging lumbar disc    CAP (community acquired pneumonia) 02/18/2016   Cholinergic urticaria    Complication of anesthesia    Depression 02/26/2022   I lost my father in January of  this year.   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 90 tablet 1   DULoxetine (CYMBALTA) 60 MG capsule Take 1 capsule (60 mg total) by mouth daily. 90 capsule 1   fexofenadine (ALLEGRA) 180 MG tablet Take 1-2 tablets (180-360 mg total) by mouth daily. 180 tablet 3   Multiple Vitamin (MULTI-VITAMINS) TABS Take by mouth.     pantoprazole (PROTONIX) 40 MG tablet Take 1 tablet (40 mg total) by mouth at bedtime. 30 tablet 3   pregabalin (LYRICA) 150 MG capsule Take 1 capsule (150 mg total) by mouth 3 (three) times daily. 270 capsule 1   No current facility-administered medications for this visit.    Allergies as of 02/22/2023 - Review Complete 02/22/2023  Allergen Reaction Noted   Cat hair extract Hives and Other (See Comments) 10/15/2015   Other Hives and Other (See Comments) 10/15/2015   Alpha-gal Hives 10/28/2020   Galactose Other (See Comments) 05/20/2017    ROS:  General: Negative for anorexia, weight loss, fever, chills, fatigue, weakness. ENT: Negative for hoarseness, difficulty swallowing , nasal congestion. CV: Negative for chest pain, angina, palpitations, dyspnea on exertion, peripheral edema.  Respiratory: Negative for dyspnea at rest, dyspnea on exertion, cough, sputum, wheezing.  GI: See history of present illness. GU:  Negative for dysuria, hematuria, urinary incontinence, urinary frequency, nocturnal  urination.  Endo: Negative for unusual weight change.    Physical Examination:   BP 138/84 (BP Location: Left Arm, Patient Position: Sitting, Cuff Size: Large)   Pulse 84   Temp 98.6 F (37 C) (Oral)   Ht 6' 1.5" (1.867 m)   Wt 231 lb (104.8 kg)   BMI 30.06 kg/m   General: Well-nourished, well-developed in no acute distress.  Eyes: No icterus. Conjunctivae pink. Neuro: Alert and oriented x 3.  Grossly intact. Skin: Warm and dry, no jaundice.   Psych: Alert and cooperative, normal mood and affect.  Labs:    Imaging Studies: No results found.  Assessment and Plan:   [name] is a 47 y.o. 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  The patient has been told about this diagnosis and what needs.  The patient has also been told about getting some other test to rule out possible other causes.  The patient will have his blood sent off for DQ2 and DQ 8 because he states he is on a gluten-free diet because his holistic doctor told him that he is allergic to gluten.  The patient's stools will also be sent off for fecal calprotectin he will have a breath test for SIBO he has lost weight.  The patient has been explained the plan and agrees with it.     Midge Minium, MD. Clementeen Graham    Note: This dictation was prepared with Dragon dictation along with smaller phrase technology. Any transcriptional errors that result from this process are unintentional.

## 2023-02-23 DIAGNOSIS — R197 Diarrhea, unspecified: Secondary | ICD-10-CM | POA: Diagnosis not present

## 2023-02-26 LAB — CALPROTECTIN, FECAL: Calprotectin, Fecal: 60 ug/g (ref 0–120)

## 2023-03-01 ENCOUNTER — Encounter: Payer: Self-pay | Admitting: Gastroenterology

## 2023-03-01 DIAGNOSIS — R197 Diarrhea, unspecified: Secondary | ICD-10-CM

## 2023-03-01 DIAGNOSIS — R195 Other fecal abnormalities: Secondary | ICD-10-CM

## 2023-03-03 LAB — CELIAC HLA RFLX TO ABS
DQ2 (DQA1 0501/0505,DQB1 02XX): NEGATIVE
DQ8 (DQA1 03XX, DQB1 0302): NEGATIVE

## 2023-03-10 ENCOUNTER — Other Ambulatory Visit: Payer: Self-pay | Admitting: Family Medicine

## 2023-03-10 NOTE — Telephone Encounter (Signed)
Medication Refill - Medication: DULoxetine (CYMBALTA) 60 MG capsule (patient has 1 week worth of pills)  Has the patient contacted their pharmacy? Yes.    (Agent: If yes, when and what did the pharmacy advise?) Contact PCP office   Preferred Pharmacy (with phone number or street name):  [facility] [address] [place], [place] - [address] [place] [phone] N Phone: [phone]  Fax: [phone]       Has the patient been seen for an appointment in the last year OR does the patient have an upcoming appointment? Yes.    Agent: Please be advised that RX refills may take up to 3 business days. We ask that you follow-up with your pharmacy.

## 2023-03-12 MED ORDER — DULOXETINE HCL 60 MG PO CPEP: 60.0000 mg | ORAL_CAPSULE | Freq: Every day | ORAL | 0 refills | Status: DC

## 2023-03-12 NOTE — Telephone Encounter (Signed)
Requested Prescriptions  Pending Prescriptions Disp Refills   DULoxetine (CYMBALTA) 60 MG capsule 90 capsule 0    Sig: Take 1 capsule (60 mg total) by mouth daily.     Psychiatry: Antidepressants - SNRI - duloxetine Passed - 03/10/2023 12:33 PM      Passed - Cr in normal range and within 360 days    Creatinine, Ser  Date Value Ref Range Status  01/21/2023 1.07 0.76 - 1.27 mg/dL Final         Passed - eGFR is 30 or above and within 360 days    GFR calc Af Amer  Date Value Ref Range Status  05/30/2020 92 >59 mL/min/1.73 Final    Comment:    **In accordance with recommendations from the NKF-ASN Task force,**   [facility] is in the process of updating its eGFR calculation to the   2021 CKD-EPI creatinine equation that estimates kidney function   without a race variable.    GFR, Estimated  Date Value Ref Range Status  04/14/2022 >60 >60 mL/min Final    Comment:    (NOTE) Calculated using the CKD-EPI Creatinine Equation (2021)    eGFR  Date Value Ref Range Status  01/21/2023 86 >59 mL/min/1.73 Final         Passed - Completed PHQ-2 or PHQ-9 in the last 360 days      Passed - Last BP in normal range    BP Readings from Last 1 Encounters:  02/22/23 138/84         Passed - Valid encounter within last 6 months    Recent Outpatient Visits           1 month ago Generalized abdominal pain   [place] [facility] [place], Oswaldo Conroy, PA-C   6 months ago COVID   [place] [facility] [place], [place], DO   7 months ago Acute cough   [place] [facility] [place], Sherran Needs, NP   10 months ago Myofascial pain   [place] [facility] [place], [place], DO   11 months ago Primary hypertension   [place] [facility] [place], [place], DO       Future Appointments             In 1 week Laural Benes, Oralia Rud, DO [facility]

## 2023-03-19 ENCOUNTER — Encounter: Payer: Self-pay | Admitting: Family Medicine

## 2023-03-19 ENCOUNTER — Ambulatory Visit
Admission: RE | Admit: 2023-03-19 | Discharge: 2023-03-19 | Disposition: A | Discharge status: Home / Self Care | Payer: BC Managed Care – PPO | Source: Ambulatory Visit | Attending: Family Medicine | Admitting: Family Medicine

## 2023-03-19 ENCOUNTER — Ambulatory Visit: Payer: BC Managed Care – PPO | Admitting: Family Medicine

## 2023-03-19 VITALS — BP 135/88 | HR 66 | Temp 97.9°F | Ht 72.0 in | Wt 237.2 lb

## 2023-03-19 DIAGNOSIS — M25521 Pain in right elbow: Secondary | ICD-10-CM | POA: Diagnosis not present

## 2023-03-19 DIAGNOSIS — S3992XA Unspecified injury of lower back, initial encounter: Secondary | ICD-10-CM | POA: Diagnosis not present

## 2023-03-19 DIAGNOSIS — K219 Gastro-esophageal reflux disease without esophagitis: Secondary | ICD-10-CM

## 2023-03-19 DIAGNOSIS — Z0000 Encounter for general adult medical examination without abnormal findings: Secondary | ICD-10-CM | POA: Diagnosis not present

## 2023-03-19 DIAGNOSIS — M438X6 Other specified deforming dorsopathies, lumbar region: Secondary | ICD-10-CM | POA: Diagnosis not present

## 2023-03-19 DIAGNOSIS — R1084 Generalized abdominal pain: Secondary | ICD-10-CM

## 2023-03-19 DIAGNOSIS — I10 Essential (primary) hypertension: Secondary | ICD-10-CM | POA: Diagnosis not present

## 2023-03-19 DIAGNOSIS — M533 Sacrococcygeal disorders, not elsewhere classified: Secondary | ICD-10-CM | POA: Diagnosis not present

## 2023-03-19 DIAGNOSIS — F419 Anxiety disorder, unspecified: Secondary | ICD-10-CM

## 2023-03-19 DIAGNOSIS — M47816 Spondylosis without myelopathy or radiculopathy, lumbar region: Secondary | ICD-10-CM | POA: Diagnosis not present

## 2023-03-19 LAB — MICROALBUMIN, URINE WAIVED
Creatinine, Urine Waived: 200 mg/dL (ref 10–300)
Microalb, Ur Waived: 30 mg/L — ABNORMAL HIGH (ref 0–19)
Microalb/Creat Ratio: <30 mg/g (ref <30)

## 2023-03-19 MED ORDER — PREGABALIN 150 MG PO CAPS: 150.0000 mg | ORAL_CAPSULE | Freq: Three times a day (TID) | ORAL | 1 refills | Status: DC

## 2023-03-19 MED ORDER — PANTOPRAZOLE SODIUM 40 MG PO TBEC: 40.0000 mg | DELAYED_RELEASE_TABLET | Freq: Every day | ORAL | 1 refills | Status: DC

## 2023-03-19 MED ORDER — KETOROLAC TROMETHAMINE 60 MG/2ML IM SOLN
60.0000 mg | Freq: Once | INTRAMUSCULAR | Status: AC
  Administered 2023-03-19: 60 mg via INTRAMUSCULAR

## 2023-03-19 MED ORDER — FEXOFENADINE HCL 180 MG PO TABS: 180.0000 mg | ORAL_TABLET | Freq: Every day | ORAL | 3 refills | Status: DC

## 2023-03-19 MED ORDER — MUPIROCIN 2 % EX OINT: 1.0000 | TOPICAL_OINTMENT | Freq: Two times a day (BID) | CUTANEOUS | 0 refills | Status: DC

## 2023-03-19 MED ORDER — DULOXETINE HCL 60 MG PO CPEP: 60.0000 mg | ORAL_CAPSULE | Freq: Every day | ORAL | 1 refills | Status: DC

## 2023-03-19 MED ORDER — AMLODIPINE BESYLATE 5 MG PO TABS: 5.0000 mg | ORAL_TABLET | Freq: Every day | ORAL | 1 refills | Status: DC

## 2023-03-19 NOTE — Progress Notes (Signed)
BP 135/88   Pulse 66   Temp 97.9 F (36.6 C) (Oral)   Ht 6' (1.829 m)   Wt 237 lb 3.2 oz (107.6 kg)   SpO2 93%   BMI 32.17 kg/m    Subjective:    Patient ID: John Huang, male    DOB: 06/13/1975, 47 y.o.   MRN: [id number]  HPI: John Huang is a 47 y.o. male presenting on 03/19/2023 for comprehensive medical examination. Current medical complaints include:  BACK PAIN Duration: couple of weeks Mechanism of injury: fell on his tailbone Location: tailbone and R low back, R elbow Onset: sudden Severity: moderate Quality: sharp and dull Frequency: with sitting Radiation: no Aggravating factors: sitting, pushing on it Alleviating factors: not sitting Status: stable Treatments attempted: rest, ice, heat, APAP, ibuprofen, aleve, and HEP  Relief with NSAIDs?: no Nighttime pain:  yes Paresthesias / decreased sensation:  no Bowel / bladder incontinence:  no Fevers:  no Dysuria / urinary frequency:  no  HYPERTENSION  Hypertension status: controlled  Satisfied with current treatment? yes Duration of hypertension: chronic BP monitoring frequency:  not checking BP medication side effects:  no Medication compliance: excellent compliance Previous BP meds: amlodipine Aspirin: no Recurrent headaches: no Visual changes: no Palpitations: no Dyspnea: no Chest pain: no Lower extremity edema: no Dizzy/lightheaded: no  ANXIETY/DEPRESSION Duration: chronic Status:controlled Anxious mood: no  Excessive worrying: no Irritability: no  Sweating: no Nausea: no Palpitations:no Hyperventilation: no Panic attacks: no Agoraphobia: no  Obscessions/compulsions: no Depressed mood: no    03/19/2023    3:04 PM 01/21/2023    8:55 AM 08/21/2022   10:49 AM 05/15/2022   10:12 AM 03/30/2022    9:03 AM  Depression screen PHQ 2/9  Decreased Interest 0 0 0 0 0  Down, Depressed, Hopeless 0 0 0 0 0  PHQ - 2 Score 0 0 0 0 0  Altered sleeping 0 0 0 0 0  Tired, decreased energy 0 0 0 0  0  Change in appetite 0 0 0 0 0  Feeling bad or failure about yourself  0 0 0 0 0  Trouble concentrating 0 0 0 0 0  Moving slowly or fidgety/restless 0 0 0 0 0  Suicidal thoughts 0 0 0 0 0  PHQ-9 Score 0 0 0 0 0  Difficult doing work/chores Not difficult at all Not difficult at all Not difficult at all Not difficult at all Not difficult at all   Anhedonia: no Weight changes: no Insomnia: no   Hypersomnia: no Fatigue/loss of energy: yes Feelings of worthlessness: no Feelings of guilt: no Impaired concentration/indecisiveness: no Suicidal ideations: no  Crying spells: no Recent Stressors/Life Changes: no   Relationship problems: no   Family stress: no     Financial stress: no    Job stress: no    Recent death/loss: no   He currently lives with: wife and kids Interim Problems from his last visit: yes  Depression Screen done today and results listed below:     03/19/2023    3:04 PM 01/21/2023    8:55 AM 08/21/2022   10:49 AM 05/15/2022   10:12 AM 03/30/2022    9:03 AM  Depression screen PHQ 2/9  Decreased Interest 0 0 0 0 0  Down, Depressed, Hopeless 0 0 0 0 0  PHQ - 2 Score 0 0 0 0 0  Altered sleeping 0 0 0 0 0  Tired, decreased energy 0 0 0 0 0  Change in appetite  0 0 0 0 0  Feeling bad or failure about yourself  0 0 0 0 0  Trouble concentrating 0 0 0 0 0  Moving slowly or fidgety/restless 0 0 0 0 0  Suicidal thoughts 0 0 0 0 0  PHQ-9 Score 0 0 0 0 0  Difficult doing work/chores Not difficult at all Not difficult at all Not difficult at all Not difficult at all Not difficult at all    Past Medical History:  Past Medical History:  Diagnosis Date   Allergy 03/07/1995   Asthma    when 47 yrs old, years ago   Bulging lumbar disc    CAP (community acquired pneumonia) 02/18/2016   Cholinergic urticaria    Complication of anesthesia    Depression 02/26/2022   I lost my father in January of  this year.   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   COLONOSCOPY WITH PROPOFOL N/A 08/16/2020   Procedure: COLONOSCOPY WITH PROPOFOL;  Surgeon: Midge Minium, MD;  Location: [facility];  Service: Endoscopy;  Laterality: N/A;   ELBOW SURGERY Left 06/20/2020   ESOPHAGOGASTRODUODENOSCOPY (EGD) WITH PROPOFOL N/A 08/16/2020   Procedure: ESOPHAGOGASTRODUODENOSCOPY (EGD) WITH PROPOFOL;  Surgeon: Midge Minium, MD;  Location: [facility];  Service: Endoscopy;  Laterality: N/A;   FOOT SURGERY  2020   MEDIAL COLLATERAL LIGAMENT AND LATERAL COLLATERAL LIGAMENT REPAIR, KNEE     SHOULDER SURGERY     dislocation and tac left in there, so had to have surgery to removed the tac   TONSILLECTOMY  2020   WISDOM TOOTH EXTRACTION      Medications:  Current Outpatient Medications on File Prior to Visit  Medication Sig   Multiple Vitamin (MULTI-VITAMINS) TABS Take by mouth.   OVER THE COUNTER MEDICATION FC-Cidal dietary supplement   OVER THE COUNTER MEDICATION ADP dietary supplement   OVER THE COUNTER MEDICATION Beberine dietary supplement   OVER THE COUNTER MEDICATION Dysbiocide dietary supplement   OVER THE COUNTER MEDICATION Pancreatin/ Ox bile extract dietary supplement   No current facility-administered medications on file prior to visit.    Allergies:  Allergies  Allergen Reactions   Cat Hair Extract Hives and Other (See Comments)    Wheezing, eyes itch Wheezing, eyes itch Wheezing, eyes itch   Other Hives and Other (See Comments)    Wheezing, eyes itch   Alpha-Gal Hives   Galactose Other  (See Comments)    Flu like symptoms after consuming mammalian products Flu like symptoms after consuming mammalian products Flu like symptoms after consuming mammalian products Flu like symptoms after consuming mammalian products    Social History:  Social History   Socioeconomic History   Marital status: Married    Spouse name: Dominigue Forino   Number of children: 2   Years of education: Not on file   Highest education level: Bachelor's degree (e.g., BA, AB, BS)  Occupational History  Not on file  Tobacco Use   Smoking status: Never   Smokeless tobacco: Never  Vaping Use   Vaping status: Never Used  Substance and Sexual Activity   Alcohol use: Not Currently   Drug use: Never   Sexual activity: Yes    Birth control/protection: Condom, Inserts, None  Other Topics Concern   Not on file  Social History Narrative   Right Handed    Lives in one story home    Social Determinants of Health   Financial Resource Strain: Medium Risk (03/19/2023)   Overall Financial Resource Strain (CARDIA)    Difficulty of Paying Living Expenses: Somewhat hard  Food Insecurity: Food Insecurity Present (03/19/2023)   Hunger Vital Sign    Worried About Running Out of Food in the Last Year: Sometimes true    Ran Out of Food in the Last Year: Never true  Transportation Needs: No Transportation Needs (03/19/2023)   PRAPARE - Administrator, Civil Service (Medical): No    Lack of Transportation (Non-Medical): No  Physical Activity: Insufficiently Active (03/19/2023)   Exercise Vital Sign    Days of Exercise per Week: 2 days    Minutes of Exercise per Session: 10 min  Stress: Stress Concern Present (03/19/2023)   [employer] of Occupational Health - Occupational Stress Questionnaire    Feeling of Stress : Rather much  Social Connections: Moderately Integrated (03/19/2023)   Social Connection and Isolation Panel [NHANES]    Frequency of Communication with Friends and Family: Never     Frequency of Social Gatherings with Friends and Family: Once a week    Attends Religious Services: 1 to 4 times per year    Active Member of [employer] or Organizations: No    Attends Engineer, structural: 1 to 4 times per year    Marital Status: Married  Catering manager Violence: Not on file   Social History   Tobacco Use  Smoking Status Never  Smokeless Tobacco Never   Social History   Substance and Sexual Activity  Alcohol Use Not Currently    Family History:  Family History  Problem Relation Age of Onset   Stroke Mother    Hypertension Mother    Diabetes Mother    Arthritis Mother    Asthma Mother    ADD / ADHD Mother    Cancer Father        Throat   ADD / ADHD Father    Cancer Sister        Eye/Brain   Hypertension Brother    Irritable bowel syndrome Brother    Cancer Maternal Grandmother    Cancer Paternal Grandmother    Cancer Paternal Uncle     Past medical history, surgical history, medications, allergies, family history and social history reviewed with patient today and changes made to appropriate areas of the chart.   Review of Systems  Constitutional: Negative.   HENT: Negative.    Eyes: Negative.   Respiratory: Negative.    Cardiovascular: Negative.   Gastrointestinal: Negative.   Genitourinary: Negative.   Musculoskeletal:  Positive for back pain and myalgias. Negative for falls, joint pain and neck pain.  Skin: Negative.   Neurological: Negative.   Endo/Heme/Allergies: Negative.   Psychiatric/Behavioral: Negative.     All other ROS negative except what is listed above and in the HPI.      Objective:    BP 135/88   Pulse 66   Temp 97.9 F (36.6 C) (Oral)  Ht 6' (1.829 m)   Wt 237 lb 3.2 oz (107.6 kg)   SpO2 93%   BMI 32.17 kg/m   Wt Readings from Last 3 Encounters:  03/19/23 237 lb 3.2 oz (107.6 kg)  02/22/23 231 lb (104.8 kg)  01/21/23 225 lb 6.4 oz (102.2 kg)    Physical Exam Vitals and nursing note reviewed.   Constitutional:      General: He is not in acute distress.    Appearance: Normal appearance. He is not ill-appearing, toxic-appearing or diaphoretic.  HENT:     Head: Normocephalic and atraumatic.     Right Ear: Tympanic membrane, ear canal and external ear normal. There is no impacted cerumen.     Left Ear: Tympanic membrane, ear canal and external ear normal. There is no impacted cerumen.     Nose: Nose normal. No congestion or rhinorrhea.     Mouth/Throat:     Mouth: Mucous membranes are moist.     Pharynx: Oropharynx is clear. No oropharyngeal exudate or posterior oropharyngeal erythema.  Eyes:     General: No scleral icterus.       Right eye: No discharge.        Left eye: No discharge.     Extraocular Movements: Extraocular movements intact.     Conjunctiva/sclera: Conjunctivae normal.     Pupils: Pupils are equal, round, and reactive to light.  Neck:     Vascular: No carotid bruit.  Cardiovascular:     Rate and Rhythm: Normal rate and regular rhythm.     Pulses: Normal pulses.     Heart sounds: No murmur heard.    No friction rub. No gallop.  Pulmonary:     Effort: Pulmonary effort is normal. No respiratory distress.     Breath sounds: Normal breath sounds. No stridor. No wheezing, rhonchi or rales.  Chest:     Chest wall: No tenderness.  Abdominal:     General: Abdomen is flat. Bowel sounds are normal. There is no distension.     Palpations: Abdomen is soft. There is no mass.     Tenderness: There is no abdominal tenderness. There is no right CVA tenderness, left CVA tenderness, guarding or rebound.     Hernia: No hernia is present.  Genitourinary:    Comments: Genital exam deferred with shared decision making Musculoskeletal:        General: No swelling, tenderness, deformity or signs of injury.     Cervical back: Normal range of motion and neck supple. No rigidity. No muscular tenderness.     Right lower leg: No edema.     Left lower leg: No edema.   Lymphadenopathy:     Cervical: No cervical adenopathy.  Skin:    General: Skin is warm and dry.     Capillary Refill: Capillary refill takes less than 2 seconds.     Coloration: Skin is not jaundiced or pale.     Findings: No bruising, erythema, lesion or rash.  Neurological:     General: No focal deficit present.     Mental Status: He is alert and oriented to person, place, and time.     Cranial Nerves: No cranial nerve deficit.     Sensory: No sensory deficit.     Motor: No weakness.     Coordination: Coordination normal.     Gait: Gait normal.     Deep Tendon Reflexes: Reflexes normal.  Psychiatric:        Mood and Affect: Mood normal.  Behavior: Behavior normal.        Thought Content: Thought content normal.        Judgment: Judgment normal.     Results for orders placed or performed in visit on 03/19/23  Comprehensive metabolic panel  Result Value Ref Range   Glucose 84 70 - 99 mg/dL   BUN 9 6 - 24 mg/dL   Creatinine, Ser 8.29 0.76 - 1.27 mg/dL   eGFR 79 >56 OZ/HYQ/6.57   BUN/Creatinine Ratio 8 (L) 9 - 20   Sodium 144 134 - 144 mmol/L   Potassium 4.5 3.5 - 5.2 mmol/L   Chloride 103 96 - 106 mmol/L   CO2 24 20 - 29 mmol/L   Calcium 9.4 8.7 - 10.2 mg/dL   Total Protein 6.9 6.0 - 8.5 g/dL   Albumin 4.3 4.1 - 5.1 g/dL   Globulin, Total 2.6 1.5 - 4.5 g/dL   Bilirubin Total <8.4 0.0 - 1.2 mg/dL   Alkaline Phosphatase 80 44 - 121 IU/L   AST 22 0 - 40 IU/L   ALT 20 0 - 44 IU/L  CBC with Differential/Platelet  Result Value Ref Range   WBC 5.7 3.4 - 10.8 x10E3/uL   RBC 5.01 4.14 - 5.80 x10E6/uL   Hemoglobin 14.6 13.0 - 17.7 g/dL   Hematocrit 69.6 29.5 - 51.0 %   MCV 91 79 - 97 fL   MCH 29.1 26.6 - 33.0 pg   MCHC 31.9 31.5 - 35.7 g/dL   RDW 28.4 13.2 - 44.0 %   Platelets 261 150 - 450 x10E3/uL   Neutrophils 40 Not Estab. %   Lymphs 46 Not Estab. %   Monocytes 8 Not Estab. %   Eos 5 Not Estab. %   Basos 1 Not Estab. %   Neutrophils Absolute 2.3 1.4 - 7.0  x10E3/uL   Lymphocytes Absolute 2.6 0.7 - 3.1 x10E3/uL   Monocytes Absolute 0.4 0.1 - 0.9 x10E3/uL   EOS (ABSOLUTE) 0.3 0.0 - 0.4 x10E3/uL   Basophils Absolute 0.1 0.0 - 0.2 x10E3/uL   Immature Granulocytes 0 Not Estab. %   Immature Grans (Abs) 0.0 0.0 - 0.1 x10E3/uL  Lipid Panel w/o Chol/HDL Ratio  Result Value Ref Range   Cholesterol, Total 143 100 - 199 mg/dL   Triglycerides 74 0 - 149 mg/dL   HDL 45 >10 mg/dL   VLDL Cholesterol Cal 15 5 - 40 mg/dL   LDL Chol Calc (NIH) 83 0 - 99 mg/dL  PSA  Result Value Ref Range   Prostate Specific Ag, Serum 2.1 0.0 - 4.0 ng/mL  TSH  Result Value Ref Range   TSH 1.610 0.450 - 4.500 uIU/mL  Microalbumin, Urine Waived  Result Value Ref Range   Microalb, Ur Waived 30 (H) 0 - 19 mg/L   Creatinine, Urine Waived 200 10 - 300 mg/dL   Microalb/Creat Ratio <30 <30 mg/g      Assessment & Plan:   Problem List Items Addressed This Visit       Cardiovascular and Mediastinum   Hypertension    Under good control on current regimen. Continue current regimen. Continue to monitor. Call with any concerns. Refills given. Labs drawn today.       Relevant Medications   amLODipine (NORVASC) 5 MG tablet   Other Relevant Orders   Microalbumin, Urine Waived (Completed)     Digestive   GERD (gastroesophageal reflux disease)    Under good control on current regimen. Continue current regimen. Continue to monitor. Call with any  concerns. Refills given. Labs drawn today.        Relevant Medications   pantoprazole (PROTONIX) 40 MG tablet     Other   Anxiety    Under good control on current regimen. Continue current regimen. Continue to monitor. Call with any concerns. Refills given. Labs drawn today.       Relevant Medications   DULoxetine (CYMBALTA) 60 MG capsule   Other Visit Diagnoses     Routine general medical examination at a health care facility    -  Primary   Vaccines up to date. Screening labs checked today. Colonoscopy up to date.  Continue diet and exercise. Call with any concerns.   Relevant Orders   Comprehensive metabolic panel (Completed)   CBC with Differential/Platelet (Completed)   Lipid Panel w/o Chol/HDL Ratio (Completed)   PSA (Completed)   TSH (Completed)   Injury of coccyx, initial encounter       Will send for x-ray. Await results. Treat as needed.   Relevant Medications   ketorolac (TORADOL) injection 60 mg (Completed)   Other Relevant Orders   DG Lumbar Spine Complete (Completed)   Right elbow pain       Will send for x-ray. Await results. Treat as needed.   Relevant Medications   ketorolac (TORADOL) injection 60 mg (Completed)   Other Relevant Orders   DG Elbow Complete Right       LABORATORY TESTING:  Health maintenance labs ordered today as discussed above.   The natural history of prostate cancer and ongoing controversy regarding screening and potential treatment outcomes of prostate cancer has been discussed with the patient. The meaning of a false positive PSA and a false negative PSA has been discussed. He indicates understanding of the limitations of this screening test and wishes to proceed with screening PSA testing.   IMMUNIZATIONS:   - Tdap: Tetanus vaccination status reviewed: last tetanus booster within 10 years. - Influenza: Up to date - Pneumovax: Not applicable - Prevnar: Not applicable - COVID: Up to date - HPV: Not applicable - Shingrix vaccine: Not applicable  SCREENING: - Colonoscopy: Up to date  Discussed with patient purpose of the colonoscopy is to detect colon cancer at curable precancerous or early stages   PATIENT COUNSELING:    Sexuality: Discussed sexually transmitted diseases, partner selection, use of condoms, avoidance of unintended pregnancy  and contraceptive alternatives.   Advised to avoid cigarette smoking.  I discussed with the patient that most people either abstain from alcohol or drink within safe limits (<=14/week and <=4 drinks/occasion  for males, <=7/weeks and <= 3 drinks/occasion for females) and that the risk for alcohol disorders and other health effects rises proportionally with the number of drinks per week and how often a drinker exceeds daily limits.  Discussed cessation/primary prevention of drug use and availability of treatment for abuse.   Diet: Encouraged to adjust caloric intake to maintain  or achieve ideal body weight, to reduce intake of dietary saturated fat and total fat, to limit sodium intake by avoiding high sodium foods and not adding table salt, and to maintain adequate dietary potassium and calcium preferably from fresh fruits, vegetables, and low-fat dairy products.    stressed the importance of regular exercise  Injury prevention: Discussed safety belts, safety helmets, smoke detector, smoking near bedding or upholstery.   Dental health: Discussed importance of regular tooth brushing, flossing, and dental visits.   Follow up plan: NEXT PREVENTATIVE PHYSICAL DUE IN 1 YEAR. Return in about 6 months (  around 09/16/2023).

## 2023-03-20 LAB — PSA: Prostate Specific Ag, Serum: 2.1 ng/mL (ref 0.0–4.0)

## 2023-03-20 LAB — CBC WITH DIFFERENTIAL/PLATELET
Basophils Absolute: 0.1 x10E3/uL (ref 0.0–0.2)
Basos: 1 %
EOS (ABSOLUTE): 0.3 x10E3/uL (ref 0.0–0.4)
Eos: 5 %
Hematocrit: 45.8 % (ref 37.5–51.0)
Hemoglobin: 14.6 g/dL (ref 13.0–17.7)
Immature Grans (Abs): 0 x10E3/uL (ref 0.0–0.1)
Immature Granulocytes: 0 %
Lymphocytes Absolute: 2.6 x10E3/uL (ref 0.7–3.1)
Lymphs: 46 %
MCH: 29.1 pg (ref 26.6–33.0)
MCHC: 31.9 g/dL (ref 31.5–35.7)
MCV: 91 fL (ref 79–97)
Monocytes Absolute: 0.4 x10E3/uL (ref 0.1–0.9)
Monocytes: 8 %
Neutrophils Absolute: 2.3 x10E3/uL (ref 1.4–7.0)
Neutrophils: 40 %
Platelets: 261 x10E3/uL (ref 150–450)
RBC: 5.01 x10E6/uL (ref 4.14–5.80)
RDW: 14.9 % (ref 11.6–15.4)
WBC: 5.7 x10E3/uL (ref 3.4–10.8)

## 2023-03-20 LAB — COMPREHENSIVE METABOLIC PANEL WITH GFR
ALT: 20 IU/L (ref 0–44)
AST: 22 IU/L (ref 0–40)
Albumin: 4.3 g/dL (ref 4.1–5.1)
Alkaline Phosphatase: 80 IU/L (ref 44–121)
BUN/Creatinine Ratio: 8 — ABNORMAL LOW (ref 9–20)
BUN: 9 mg/dL (ref 6–24)
Bilirubin Total: <0.2 mg/dL (ref 0.0–1.2)
CO2: 24 mmol/L (ref 20–29)
Calcium: 9.4 mg/dL (ref 8.7–10.2)
Chloride: 103 mmol/L (ref 96–106)
Creatinine, Ser: 1.15 mg/dL (ref 0.76–1.27)
Globulin, Total: 2.6 g/dL (ref 1.5–4.5)
Glucose: 84 mg/dL (ref 70–99)
Potassium: 4.5 mmol/L (ref 3.5–5.2)
Sodium: 144 mmol/L (ref 134–144)
Total Protein: 6.9 g/dL (ref 6.0–8.5)
eGFR: 79 mL/min/1.73

## 2023-03-20 LAB — LIPID PANEL W/O CHOL/HDL RATIO
Cholesterol, Total: 143 mg/dL (ref 100–199)
HDL: 45 mg/dL
LDL Chol Calc (NIH): 83 mg/dL (ref 0–99)
Triglycerides: 74 mg/dL (ref 0–149)
VLDL Cholesterol Cal: 15 mg/dL (ref 5–40)

## 2023-03-20 LAB — TSH: TSH: 1.61 u[IU]/mL (ref 0.450–4.500)

## 2023-03-28 NOTE — Assessment & Plan Note (Signed)
Under good control on current regimen. Continue current regimen. Continue to monitor. Call with any concerns. Refills given. Labs drawn today.   

## 2023-04-01 DIAGNOSIS — M7711 Lateral epicondylitis, right elbow: Secondary | ICD-10-CM | POA: Insufficient documentation

## 2023-06-07 DIAGNOSIS — M25562 Pain in left knee: Secondary | ICD-10-CM | POA: Diagnosis not present

## 2023-06-10 DIAGNOSIS — M25562 Pain in left knee: Secondary | ICD-10-CM | POA: Diagnosis not present

## 2023-06-15 DIAGNOSIS — S83242A Other tear of medial meniscus, current injury, left knee, initial encounter: Secondary | ICD-10-CM | POA: Diagnosis not present

## 2023-06-21 DIAGNOSIS — M24852 Other specific joint derangements of left hip, not elsewhere classified: Secondary | ICD-10-CM | POA: Diagnosis not present

## 2023-06-21 DIAGNOSIS — M25562 Pain in left knee: Secondary | ICD-10-CM | POA: Diagnosis not present

## 2023-06-22 DIAGNOSIS — Z01818 Encounter for other preprocedural examination: Secondary | ICD-10-CM | POA: Diagnosis not present

## 2023-06-24 DIAGNOSIS — M25562 Pain in left knee: Secondary | ICD-10-CM | POA: Diagnosis not present

## 2023-06-24 DIAGNOSIS — M24852 Other specific joint derangements of left hip, not elsewhere classified: Secondary | ICD-10-CM | POA: Diagnosis not present

## 2023-06-28 DIAGNOSIS — S83242A Other tear of medial meniscus, current injury, left knee, initial encounter: Secondary | ICD-10-CM | POA: Diagnosis not present

## 2023-06-28 DIAGNOSIS — S83272A Complex tear of lateral meniscus, current injury, left knee, initial encounter: Secondary | ICD-10-CM | POA: Diagnosis not present

## 2023-06-28 DIAGNOSIS — M2242 Chondromalacia patellae, left knee: Secondary | ICD-10-CM | POA: Diagnosis not present

## 2023-06-28 DIAGNOSIS — M6752 Plica syndrome, left knee: Secondary | ICD-10-CM | POA: Diagnosis not present

## 2023-06-28 DIAGNOSIS — S83232A Complex tear of medial meniscus, current injury, left knee, initial encounter: Secondary | ICD-10-CM | POA: Diagnosis not present

## 2023-06-28 DIAGNOSIS — S83282A Other tear of lateral meniscus, current injury, left knee, initial encounter: Secondary | ICD-10-CM | POA: Diagnosis not present

## 2023-07-06 DIAGNOSIS — M25562 Pain in left knee: Secondary | ICD-10-CM | POA: Diagnosis not present

## 2023-07-06 DIAGNOSIS — M24852 Other specific joint derangements of left hip, not elsewhere classified: Secondary | ICD-10-CM | POA: Diagnosis not present

## 2023-07-09 DIAGNOSIS — M24852 Other specific joint derangements of left hip, not elsewhere classified: Secondary | ICD-10-CM | POA: Diagnosis not present

## 2023-07-09 DIAGNOSIS — M25562 Pain in left knee: Secondary | ICD-10-CM | POA: Diagnosis not present

## 2023-07-19 DIAGNOSIS — M24852 Other specific joint derangements of left hip, not elsewhere classified: Secondary | ICD-10-CM | POA: Diagnosis not present

## 2023-07-19 DIAGNOSIS — M25562 Pain in left knee: Secondary | ICD-10-CM | POA: Diagnosis not present

## 2023-07-21 DIAGNOSIS — M25562 Pain in left knee: Secondary | ICD-10-CM | POA: Diagnosis not present

## 2023-07-21 DIAGNOSIS — M24852 Other specific joint derangements of left hip, not elsewhere classified: Secondary | ICD-10-CM | POA: Diagnosis not present

## 2023-07-26 DIAGNOSIS — M25562 Pain in left knee: Secondary | ICD-10-CM | POA: Diagnosis not present

## 2023-07-26 DIAGNOSIS — M24852 Other specific joint derangements of left hip, not elsewhere classified: Secondary | ICD-10-CM | POA: Diagnosis not present

## 2023-07-28 DIAGNOSIS — Z4889 Encounter for other specified surgical aftercare: Secondary | ICD-10-CM | POA: Diagnosis not present

## 2023-07-29 DIAGNOSIS — M24852 Other specific joint derangements of left hip, not elsewhere classified: Secondary | ICD-10-CM | POA: Diagnosis not present

## 2023-07-29 DIAGNOSIS — M25562 Pain in left knee: Secondary | ICD-10-CM | POA: Diagnosis not present

## 2023-08-02 DIAGNOSIS — M25562 Pain in left knee: Secondary | ICD-10-CM | POA: Diagnosis not present

## 2023-08-02 DIAGNOSIS — M24852 Other specific joint derangements of left hip, not elsewhere classified: Secondary | ICD-10-CM | POA: Diagnosis not present

## 2023-08-12 DIAGNOSIS — M25562 Pain in left knee: Secondary | ICD-10-CM | POA: Diagnosis not present

## 2023-08-12 DIAGNOSIS — M24852 Other specific joint derangements of left hip, not elsewhere classified: Secondary | ICD-10-CM | POA: Diagnosis not present

## 2023-09-16 ENCOUNTER — Ambulatory Visit: Payer: Self-pay | Admitting: Family Medicine

## 2023-09-16 NOTE — Progress Notes (Deleted)
 There were no vitals taken for this visit.   Subjective:    Patient ID: John Huang, male    DOB: 1976-02-17, 48 y.o.   MRN: [id number]  HPI: John Huang is a 48 y.o. male  No chief complaint on file.  HYPERTENSION {Blank single:19197::"without","with"} Chronic Kidney Disease Hypertension status: {Blank single:19197::"controlled","uncontrolled","better","worse","exacerbated","stable"}  Satisfied with current treatment? {Blank single:19197::"yes","no"} Duration of hypertension: {Blank single:19197::"chronic","months","years"} BP monitoring frequency:  {Blank single:19197::"not checking","rarely","daily","weekly","monthly","a few times a day","a few times a week","a few times a month"} BP range:  BP medication side effects:  {Blank single:19197::"yes","no"} Medication compliance: {Blank single:19197::"excellent compliance","good compliance","fair compliance","poor compliance"} Previous BP meds:{Blank multiple:19196::"none","amlodipine ","amlodipine /benazepril","atenolol","benazepril","benazepril/HCTZ","bisoprolol (bystolic)","carvedilol","chlorthalidone","clonidine","diltiazem","exforge HCT","HCTZ","irbesartan (avapro)","labetalol","lisinopril","lisinopril-HCTZ","losartan (cozaar)","methyldopa","nifedipine","olmesartan (benicar)","olmesartan-HCTZ","quinapril","ramipril","spironalactone","tekturna","valsartan","valsartan-HCTZ","verapamil"} Aspirin: {Blank single:19197::"yes","no"} Recurrent headaches: {Blank single:19197::"yes","no"} Visual changes: {Blank single:19197::"yes","no"} Palpitations: {Blank single:19197::"yes","no"} Dyspnea: {Blank single:19197::"yes","no"} Chest pain: {Blank single:19197::"yes","no"} Lower extremity edema: {Blank single:19197::"yes","no"} Dizzy/lightheaded: {Blank single:19197::"yes","no"}  GERD GERD control status: {Blank single:19197::"controlled","uncontrolled","better","worse","exacerbated","stable"}Satisfied with current treatment? {Blank  single:19197::"yes","no"} Heartburn frequency:  Medication side effects: {Blank single:19197::"yes","no"}  Medication compliance: {Blank multiple:19196::"better","worse","stable","fluctuating"} Previous GERD medications: Antacid use frequency:   Duration:  Nature:  Location:  Heartburn duration:  Alleviatiating factors:   Aggravating factors:  Dysphagia: {Blank single:19197::"yes","no"} Odynophagia:  {Blank single:19197::"yes","no"} Hematemesis: {Blank single:19197::"yes","no"} Blood in stool: {Blank single:19197::"yes","no"} EGD: {Blank single:19197::"yes","no"}  CHRONIC PAIN  Pain control status: {Blank single:19197::"controlled","uncontrolled","better","worse","exacerbated","stable"} Duration: {Blank single:19197::"chronic","months","years"} Location:  Quality: {Blank multiple:19196::"sharp","dull","aching","burning","cramping","ill-defined","itchy","pressure-like","pulling","shooting","sore","stabbing","tender","tearing","throbbing"} Current Pain Level: {Blank single:19197::"mild","moderate","severe","1/10","2/10","3/10","4/10","5/10","6/10","7/10","8/10","9/10","10/10"} Previous Pain Level: {Blank single:19197::"mild","moderate","severe","1/10","2/10","3/10","4/10","5/10","6/10","7/10","8/10","9/10","10/10"} Breakthrough pain: {Blank single:19197::"yes","no"} Benefit from narcotic medications: {Blank single:19197::"yes","no"} What Activities task can be accomplished with current medication? Interested in weaning off narcotics:{Blank single:19197::"yes","no"}   Stool softners/OTC fiber: {Blank single:19197::"yes","no"}  Previous pain specialty evaluation: {Blank single:19197::"yes","no"} Non-narcotic analgesic meds: {Blank single:19197::"yes","no"} Narcotic contract: {Blank single:19197::"yes","no"}   Relevant past medical, surgical, family and social history reviewed and updated as indicated. Interim medical history since our last visit reviewed. Allergies and medications  reviewed and updated.  Review of Systems  Per HPI unless specifically indicated above     Objective:    There were no vitals taken for this visit.  Wt Readings from Last 3 Encounters:  03/19/23 237 lb 3.2 oz (107.6 kg)  02/22/23 231 lb (104.8 kg)  01/21/23 225 lb 6.4 oz (102.2 kg)    Physical Exam  Results for orders placed or performed in visit on 03/19/23  Comprehensive metabolic panel   Collection Time: 03/19/23  3:41 PM  Result Value Ref Range   Glucose 84 70 - 99 mg/dL   BUN 9 6 - 24 mg/dL   Creatinine, Ser 4.09 0.76 - 1.27 mg/dL   eGFR 79 >81 XB/JYN/8.29   BUN/Creatinine Ratio 8 (L) 9 - 20   Sodium 144 134 - 144 mmol/L   Potassium 4.5 3.5 - 5.2 mmol/L   Chloride 103 96 - 106 mmol/L   CO2 24 20 - 29 mmol/L   Calcium 9.4 8.7 - 10.2 mg/dL   Total Protein 6.9 6.0 - 8.5 g/dL   Albumin 4.3 4.1 - 5.1 g/dL   Globulin, Total 2.6 1.5 - 4.5 g/dL   Bilirubin Total <5.6 0.0 - 1.2 mg/dL   Alkaline Phosphatase 80 44 - 121 IU/L   AST 22 0 - 40 IU/L   ALT 20 0 - 44 IU/L  CBC with Differential/Platelet   Collection Time: 03/19/23  3:41 PM  Result Value Ref Range   WBC 5.7 3.4 - 10.8 x10E3/uL   RBC 5.01 4.14 - 5.80 x10E6/uL   Hemoglobin 14.6 13.0 - 17.7 g/dL   Hematocrit 21.3 08.6 - 51.0 %   MCV  91 79 - 97 fL   MCH 29.1 26.6 - 33.0 pg   MCHC 31.9 31.5 - 35.7 g/dL   RDW 11.9 14.7 - 82.9 %   Platelets 261 150 - 450 x10E3/uL   Neutrophils 40 Not Estab. %   Lymphs 46 Not Estab. %   Monocytes 8 Not Estab. %   Eos 5 Not Estab. %   Basos 1 Not Estab. %   Neutrophils Absolute 2.3 1.4 - 7.0 x10E3/uL   Lymphocytes Absolute 2.6 0.7 - 3.1 x10E3/uL   Monocytes Absolute 0.4 0.1 - 0.9 x10E3/uL   EOS (ABSOLUTE) 0.3 0.0 - 0.4 x10E3/uL   Basophils Absolute 0.1 0.0 - 0.2 x10E3/uL   Immature Granulocytes 0 Not Estab. %   Immature Grans (Abs) 0.0 0.0 - 0.1 x10E3/uL  Lipid Panel w/o Chol/HDL Ratio   Collection Time: 03/19/23  3:41 PM  Result Value Ref Range   Cholesterol, Total 143 100  - 199 mg/dL   Triglycerides 74 0 - 149 mg/dL   HDL 45 >56 mg/dL   VLDL Cholesterol Cal 15 5 - 40 mg/dL   LDL Chol Calc (NIH) 83 0 - 99 mg/dL  PSA   Collection Time: 03/19/23  3:41 PM  Result Value Ref Range   Prostate Specific Ag, Serum 2.1 0.0 - 4.0 ng/mL  TSH   Collection Time: 03/19/23  3:41 PM  Result Value Ref Range   TSH 1.610 0.450 - 4.500 uIU/mL  Microalbumin, Urine Waived   Collection Time: 03/19/23  3:41 PM  Result Value Ref Range   Microalb, Ur Waived 30 (H) 0 - 19 mg/L   Creatinine, Urine Waived 200 10 - 300 mg/dL   Microalb/Creat Ratio <30 <30 mg/g      Assessment & Plan:   Problem List Items Addressed This Visit       Cardiovascular and Mediastinum   Hypertension - Primary     Digestive   GERD (gastroesophageal reflux disease)     Other   Anxiety   Myofascial pain     Follow up plan: No follow-ups on file.

## 2023-09-20 ENCOUNTER — Other Ambulatory Visit: Payer: Self-pay | Admitting: Family Medicine

## 2023-09-21 NOTE — Telephone Encounter (Signed)
 Copied from CRM [id number]. Topic: Clinical - Prescription Issue >> Sep 20, 2023  1:31 PM Lotus Round B wrote: Reason for CRM: pt called in stating the pharmacy told him medication pregabalin  (LYRICA ) 150 MG capsule has expired and would need a whole new prescription sent over to :  [facility] [address] [place] - [address] [place]  [phone] [address] [place]  [address] [place] [address] Phone: [phone] Fax: [phone] Hours: Not open 24 hours

## 2023-09-22 NOTE — Telephone Encounter (Signed)
 Appt scheduled

## 2023-09-22 NOTE — Telephone Encounter (Signed)
 Message to patient informing him to make a follow up.

## 2023-10-06 ENCOUNTER — Encounter: Payer: Self-pay | Admitting: Family Medicine

## 2023-10-27 ENCOUNTER — Other Ambulatory Visit: Payer: Self-pay | Admitting: Family Medicine

## 2023-10-27 ENCOUNTER — Ambulatory Visit: Admitting: Family Medicine

## 2023-10-27 ENCOUNTER — Encounter: Payer: Self-pay | Admitting: Family Medicine

## 2023-10-27 VITALS — BP 132/86 | HR 70 | Ht 72.0 in | Wt 235.0 lb

## 2023-10-27 DIAGNOSIS — Z7184 Encounter for health counseling related to travel: Secondary | ICD-10-CM | POA: Diagnosis not present

## 2023-10-27 DIAGNOSIS — E559 Vitamin D deficiency, unspecified: Secondary | ICD-10-CM

## 2023-10-27 DIAGNOSIS — M7918 Myalgia, other site: Secondary | ICD-10-CM

## 2023-10-27 DIAGNOSIS — I10 Essential (primary) hypertension: Secondary | ICD-10-CM

## 2023-10-27 DIAGNOSIS — F419 Anxiety disorder, unspecified: Secondary | ICD-10-CM

## 2023-10-27 MED ORDER — AMLODIPINE BESYLATE 5 MG PO TABS: 5.0000 mg | ORAL_TABLET | Freq: Every day | ORAL | 1 refills | Status: DC

## 2023-10-27 MED ORDER — PREGABALIN 150 MG PO CAPS: 150.0000 mg | ORAL_CAPSULE | Freq: Three times a day (TID) | ORAL | 1 refills | Status: DC

## 2023-10-27 MED ORDER — DULOXETINE HCL 60 MG PO CPEP: 60.0000 mg | ORAL_CAPSULE | Freq: Every day | ORAL | 1 refills | Status: DC

## 2023-10-27 NOTE — Progress Notes (Signed)
 BP 132/86   Pulse 70   Ht 6' (1.829 m)   Wt 235 lb (106.6 kg)   SpO2 98%   BMI 31.87 kg/m    Subjective:    Patient ID: John Huang, male    DOB: 1975/07/06, 48 y.o.   MRN: [id number]  HPI: John Huang is a 48 y.o. male  Chief Complaint  Patient presents with   Follow-up    6 months.    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 He is excited about these next steps in his life. Starting the process and going to visit in August. Planning to move at the beginning of the year.  HYPERTENSION  Hypertension status: controlled  Satisfied with current treatment? yes Duration of hypertension: chronic BP monitoring frequency:  not checking BP medication side effects:  no Medication compliance: excellent compliance Previous BP meds: amlodipine  Aspirin: no Recurrent headaches: no Visual changes: no Palpitations: no Dyspnea: no Chest pain: no Lower extremity edema: no Dizzy/lightheaded: no  CHRONIC PAIN  Pain control status: stable Duration: chronic Location: widespread Quality: aching and sore Current Pain Level: moderate Previous Pain Level: severe Breakthrough pain: no Benefit from narcotic medications: N/A What Activities task can be accomplished with current medication? Able to do ADLs and work Interested in Social research officer, government off narcotics: N/A   Stool softners/OTC fiber: no  Previous pain specialty evaluation: yes Non-narcotic analgesic meds: yes Narcotic contract: no  Relevant past medical, surgical, family and social history reviewed and updated as indicated. Interim medical history since our last visit reviewed. Allergies and medications reviewed and updated.  Review of Systems  Constitutional: Negative.   Respiratory: Negative.    Cardiovascular: Negative.   Musculoskeletal:  Positive for back pain and myalgias. Negative for arthralgias, gait problem, joint swelling, neck pain and neck  stiffness.  Skin: Negative.   Neurological: Negative.   Psychiatric/Behavioral: Negative.      Per HPI unless specifically indicated above     Objective:     BP 132/86   Pulse 70   Ht 6' (1.829 m)   Wt 235 lb (106.6 kg)   SpO2 98%   BMI 31.87 kg/m   Wt Readings from Last 3 Encounters:  10/27/23 235 lb (106.6 kg)  03/19/23 237 lb 3.2 oz (107.6 kg)  02/22/23 231 lb (104.8 kg)    Physical Exam Vitals and nursing note reviewed.  Constitutional:      General: He is not in acute distress.    Appearance: Normal appearance. He is not ill-appearing, toxic-appearing or diaphoretic.  HENT:     Head: Normocephalic and atraumatic.     Right Ear: External ear normal.     Left Ear: External ear normal.     Nose: Nose normal.     Mouth/Throat:     Mouth: Mucous membranes are moist.     Pharynx: Oropharynx is clear.  Eyes:     General: No scleral icterus.       Right eye: No discharge.        Left eye: No discharge.     Extraocular Movements: Extraocular movements intact.     Conjunctiva/sclera: Conjunctivae normal.     Pupils: Pupils are equal, round, and reactive to light.  Cardiovascular:     Rate and Rhythm: Normal rate and regular rhythm.     Pulses: Normal pulses.     Heart sounds: Normal heart sounds. No murmur  heard.    No friction rub. No gallop.  Pulmonary:     Effort: Pulmonary effort is normal. No respiratory distress.     Breath sounds: Normal breath sounds. No stridor. No wheezing, rhonchi or rales.  Chest:     Chest wall: No tenderness.  Musculoskeletal:        General: Normal range of motion.     Cervical back: Normal range of motion and neck supple.  Skin:    General: Skin is warm and dry.     Capillary Refill: Capillary refill takes less than 2 seconds.     Coloration: Skin is not jaundiced or pale.     Findings: No bruising, erythema, lesion or rash.  Neurological:     General: No focal deficit present.     Mental Status: He is alert and oriented to  person, place, and time. Mental status is at baseline.  Psychiatric:        Mood and Affect: Mood normal.        Behavior: Behavior normal.        Thought Content: Thought content normal.        Judgment: Judgment normal.     Results for orders placed or performed in visit on 03/19/23  Comprehensive metabolic panel   Collection Time: 03/19/23  3:41 PM  Result Value Ref Range   Glucose 84 70 - 99 mg/dL   BUN 9 6 - 24 mg/dL   Creatinine, Ser 6.96 0.76 - 1.27 mg/dL   eGFR 79 >29 BM/WUX/3.24   BUN/Creatinine Ratio 8 (L) 9 - 20   Sodium 144 134 - 144 mmol/L   Potassium 4.5 3.5 - 5.2 mmol/L   Chloride 103 96 - 106 mmol/L   CO2 24 20 - 29 mmol/L   Calcium 9.4 8.7 - 10.2 mg/dL   Total Protein 6.9 6.0 - 8.5 g/dL   Albumin 4.3 4.1 - 5.1 g/dL   Globulin, Total 2.6 1.5 - 4.5 g/dL   Bilirubin Total <4.0 0.0 - 1.2 mg/dL   Alkaline Phosphatase 80 44 - 121 IU/L   AST 22 0 - 40 IU/L   ALT 20 0 - 44 IU/L  CBC with Differential/Platelet   Collection Time: 03/19/23  3:41 PM  Result Value Ref Range   WBC 5.7 3.4 - 10.8 x10E3/uL   RBC 5.01 4.14 - 5.80 x10E6/uL   Hemoglobin 14.6 13.0 - 17.7 g/dL   Hematocrit 10.2 72.5 - 51.0 %   MCV 91 79 - 97 fL   MCH 29.1 26.6 - 33.0 pg   MCHC 31.9 31.5 - 35.7 g/dL   RDW 36.6 44.0 - 34.7 %   Platelets 261 150 - 450 x10E3/uL   Neutrophils 40 Not Estab. %   Lymphs 46 Not Estab. %   Monocytes 8 Not Estab. %   Eos 5 Not Estab. %   Basos 1 Not Estab. %   Neutrophils Absolute 2.3 1.4 - 7.0 x10E3/uL   Lymphocytes Absolute 2.6 0.7 - 3.1 x10E3/uL   Monocytes Absolute 0.4 0.1 - 0.9 x10E3/uL   EOS (ABSOLUTE) 0.3 0.0 - 0.4 x10E3/uL   Basophils Absolute 0.1 0.0 - 0.2 x10E3/uL   Immature Granulocytes 0 Not Estab. %   Immature Grans (Abs) 0.0 0.0 - 0.1 x10E3/uL  Lipid Panel w/o Chol/HDL Ratio   Collection Time: 03/19/23  3:41 PM  Result Value Ref Range   Cholesterol, Total 143 100 - 199 mg/dL   Triglycerides 74 0 - 149 mg/dL   HDL 45 >42  mg/dL   VLDL  Cholesterol Cal 15 5 - 40 mg/dL   LDL Chol Calc (NIH) 83 0 - 99 mg/dL  PSA   Collection Time: 03/19/23  3:41 PM  Result Value Ref Range   Prostate Specific Ag, Serum 2.1 0.0 - 4.0 ng/mL  TSH   Collection Time: 03/19/23  3:41 PM  Result Value Ref Range   TSH 1.610 0.450 - 4.500 uIU/mL  Microalbumin, Urine Waived   Collection Time: 03/19/23  3:41 PM  Result Value Ref Range   Microalb, Ur Waived 30 (H) 0 - 19 mg/L   Creatinine, Urine Waived 200 10 - 300 mg/dL   Microalb/Creat Ratio <30 <30 mg/g      Assessment & Plan:   Problem List Items Addressed This Visit       Cardiovascular and Mediastinum   Hypertension - Primary   Under good control on current regimen. Continue current regimen. Continue to monitor. Call with any concerns. Refills given. Labs drawn today.        Relevant Medications   amLODipine  (NORVASC ) 5 MG tablet   Other Relevant Orders   Basic metabolic panel with GFR   CBC with Differential/Platelet     Other   Anxiety   Under good control on current regimen. Continue current regimen. Continue to monitor. Call with any concerns. Refills given. Labs drawn today.       Relevant Medications   DULoxetine  (CYMBALTA ) 60 MG capsule   Myofascial pain   Under good control on current regimen. Continue current regimen. Continue to monitor. Call with any concerns. Refills given. Labs drawn today.       Other Visit Diagnoses       Vitamin D  deficiency       Rechecking labs today. Await results. Treat as needed.   Relevant Orders   VITAMIN D  25 Hydroxy (Vit-D Deficiency, Fractures)     Travel advice encounter       To be moving to [place]. Discussed vaccinations- will check titers and update as needed. Call with any concerns.   Relevant Orders   Hepatitis B surface antibody,quantitative   Hepatitis A Ab, Total   Measles/Mumps/Rubella Immunity        Follow up plan: Return in about 6 months (around 04/27/2024) for physical.

## 2023-10-27 NOTE — Assessment & Plan Note (Signed)
 Under good control on current regimen. Continue current regimen. Continue to monitor. Call with any concerns. Refills given. Labs drawn today.

## 2023-10-28 ENCOUNTER — Ambulatory Visit: Payer: Self-pay | Admitting: Family Medicine

## 2023-10-28 ENCOUNTER — Telehealth: Payer: Self-pay

## 2023-10-28 LAB — BASIC METABOLIC PANEL WITH GFR
BUN/Creatinine Ratio: 7 — ABNORMAL LOW (ref 9–20)
BUN: 7 mg/dL (ref 6–24)
CO2: 22 mmol/L (ref 20–29)
Calcium: 9.5 mg/dL (ref 8.7–10.2)
Chloride: 105 mmol/L (ref 96–106)
Creatinine, Ser: 1 mg/dL (ref 0.76–1.27)
Glucose: 80 mg/dL (ref 70–99)
Potassium: 4.3 mmol/L (ref 3.5–5.2)
Sodium: 142 mmol/L (ref 134–144)
eGFR: 93 mL/min/{1.73_m2} (ref >59)

## 2023-10-28 LAB — CBC WITH DIFFERENTIAL/PLATELET
Basophils Absolute: 0.1 10*3/uL (ref 0.0–0.2)
Basos: 1 %
EOS (ABSOLUTE): 0.2 10*3/uL (ref 0.0–0.4)
Eos: 4 %
Hematocrit: 45.1 % (ref 37.5–51.0)
Hemoglobin: 14.1 g/dL (ref 13.0–17.7)
Immature Grans (Abs): 0 10*3/uL (ref 0.0–0.1)
Immature Granulocytes: 0 %
Lymphocytes Absolute: 2.1 10*3/uL (ref 0.7–3.1)
Lymphs: 44 %
MCH: 28.7 pg (ref 26.6–33.0)
MCHC: 31.3 g/dL — ABNORMAL LOW (ref 31.5–35.7)
MCV: 92 fL (ref 79–97)
Monocytes Absolute: 0.5 10*3/uL (ref 0.1–0.9)
Monocytes: 10 %
Neutrophils Absolute: 2 10*3/uL (ref 1.4–7.0)
Neutrophils: 41 %
Platelets: 243 10*3/uL (ref 150–450)
RBC: 4.92 x10E6/uL (ref 4.14–5.80)
RDW: 16 % — ABNORMAL HIGH (ref 11.6–15.4)
WBC: 4.8 10*3/uL (ref 3.4–10.8)

## 2023-10-28 LAB — MEASLES/MUMPS/RUBELLA IMMUNITY
MUMPS ABS, IGG: <9 [AU]/ml — ABNORMAL LOW (ref >10.9)
RUBEOLA AB, IGG: 299 [AU]/ml (ref >16.4)

## 2023-10-28 LAB — HEPATITIS B SURFACE ANTIBODY, QUANTITATIVE: Hepatitis B Surf Ab Quant: <3.5 m[IU]/mL — ABNORMAL LOW

## 2023-10-28 LAB — HEPATITIS A ANTIBODY, TOTAL: hep A Total Ab: NEGATIVE

## 2023-10-28 LAB — VITAMIN D 25 HYDROXY (VIT D DEFICIENCY, FRACTURES): Vit D, 25-Hydroxy: 19.1 ng/mL — ABNORMAL LOW (ref 30.0–100.0)

## 2023-10-28 NOTE — Progress Notes (Signed)
 Appt scheduled

## 2023-10-28 NOTE — Telephone Encounter (Signed)
 Copied from CRM [id number]. Topic: General - Other >> Oct 28, 2023  2:12 PM Emylou G wrote: Reason for CRM: Patient called checking status of medication for thrush/candida?  He says pharmacy has no record yet?

## 2023-10-28 NOTE — Telephone Encounter (Signed)
 No mention in the last office visit however it is mentioned in his mychart messages to provider. Forwarding to PCP for assistance.

## 2023-10-29 NOTE — Telephone Encounter (Signed)
 Patient called back to f/u on the medication for thrush/yeast. Advised patient message was sent on yesterday and still waiting on provider response.

## 2023-10-31 MED ORDER — FLUCONAZOLE 100 MG PO TABS: 100.0000 mg | ORAL_TABLET | Freq: Every day | ORAL | 0 refills | Status: AC

## 2023-11-01 ENCOUNTER — Ambulatory Visit

## 2023-11-01 NOTE — Telephone Encounter (Unsigned)
 Copied from CRM [id number]. Topic: General - Other >> Oct 28, 2023  2:12 PM Emylou G wrote: Reason for CRM: Patient called checking status of medication for thrush/candida?  He says pharmacy has no record yet? >> Oct 29, 2023  5:37 PM Corin V wrote: Per call review, patient stated he discussed this medication during his visit with Dr. Lincoln Renshaw 6/11 and she said she would send a script in for him. Please advise as he feels this was discussed.

## 2023-11-01 NOTE — Telephone Encounter (Signed)
Rx is at the pharmacy.

## 2023-11-05 ENCOUNTER — Ambulatory Visit

## 2023-11-05 DIAGNOSIS — Z23 Encounter for immunization: Secondary | ICD-10-CM | POA: Diagnosis not present

## 2023-11-05 NOTE — Progress Notes (Signed)
 Patient in the office for nurse visit for multiple vaccines needed.  Vaccine #1 Hep A- Front of right deltoid injection given and tolerated well with no complaints.  Vaccine #2 Hep B-  Rear of right deltoid injection given and tolerated well with no complaints.  Vaccine #3 MMR- Left deltoid injection given and tolerated well with no complaints.   Patient understands when he should return to office for 2nd dose of the above vaccines.

## 2023-12-01 ENCOUNTER — Ambulatory Visit

## 2023-12-01 DIAGNOSIS — M25511 Pain in right shoulder: Secondary | ICD-10-CM | POA: Diagnosis not present

## 2023-12-01 DIAGNOSIS — M9907 Segmental and somatic dysfunction of upper extremity: Secondary | ICD-10-CM | POA: Diagnosis not present

## 2023-12-10 ENCOUNTER — Ambulatory Visit

## 2023-12-13 ENCOUNTER — Ambulatory Visit (INDEPENDENT_AMBULATORY_CARE_PROVIDER_SITE_OTHER)

## 2023-12-13 DIAGNOSIS — Z23 Encounter for immunization: Secondary | ICD-10-CM

## 2023-12-13 NOTE — Progress Notes (Signed)
 Patient is in office today for a nurse visit for 2nd Hep B  Immunization. Patient Injection was given in the  Left deltoid. Patient tolerated injection well. Needs to return in 6 months for the 3rd Hep B vaccine.

## 2023-12-16 ENCOUNTER — Ambulatory Visit: Payer: Self-pay

## 2023-12-16 NOTE — Telephone Encounter (Signed)
 FYI Only or Action Required?: FYI only for provider.  Patient was last seen in primary care on 10/27/2023 by Vicci Duwaine SQUIBB, DO.  Called Nurse Triage reporting Fibromyalgia.  Symptoms began several years ago.  Interventions attempted: Nothing.  Symptoms are: unchanged.  Triage Disposition: See PCP within 2 weeks.  Patient/caregiver understands and will follow disposition?: Yes    Copied from CRM [id number]. Topic: Clinical - Red Word Triage >> Dec 16, 2023  3:44 PM Charlet HERO wrote: Red Word that prompted transfer to Nurse Triage: patient is caling bc he is experiencing and want to have meds to help function with pain. Reason for Disposition  Diabetes mellitus or weak immune system (e.g., HIV positive, cancer chemo, splenectomy, organ transplant, chronic steroids)  Answer Assessment - Initial Assessment Questions 1. ONSET: When did the muscle aches or body pains start?      Hx fibromyalgia, started last year 2. LOCATION: What part of your body is hurting? (e.g., entire body, arms, legs)      Neck, feet, lower back, knees 3. SEVERITY: How bad is the pain? (Scale 1-10; or mild, moderate, severe)     moderate 4. CAUSE: What do you think is causing the pains?     Fibromyalgia  5. FEVER: Do you have a fever? If Yes, ask: What is your temperature, how was it measured, and  when did it start?      no 6. OTHER SYMPTOMS: Do you have any other symptoms? (e.g., chest pain, cold or flu symptoms, rash, weakness, weight loss)     no 7. PREGNANCY: Is there any chance you are pregnant? When was your last menstrual period?     no 8. TRAVEL: Have you traveled out of the country in the last month? (e.g., exposures, travel history)     no  Protocols used: Muscle Aches and Body Pain-A-AH

## 2023-12-20 ENCOUNTER — Encounter: Payer: Self-pay | Admitting: Family Medicine

## 2023-12-22 MED ORDER — CROMOLYN SODIUM 100 MG/5ML PO CONC: 100.0000 mg | Freq: Three times a day (TID) | ORAL | 12 refills | Status: AC

## 2023-12-24 DIAGNOSIS — H9202 Otalgia, left ear: Secondary | ICD-10-CM | POA: Diagnosis not present

## 2023-12-28 ENCOUNTER — Ambulatory Visit: Admitting: Family Medicine

## 2024-02-10 ENCOUNTER — Ambulatory Visit: Admitting: Family Medicine

## 2024-02-10 ENCOUNTER — Ambulatory Visit: Payer: Self-pay

## 2024-02-10 ENCOUNTER — Encounter: Payer: Self-pay | Admitting: Family Medicine

## 2024-02-10 ENCOUNTER — Other Ambulatory Visit: Payer: Self-pay | Admitting: Family Medicine

## 2024-02-10 VITALS — BP 134/76 | HR 73 | Ht 74.0 in | Wt 222.5 lb

## 2024-02-10 DIAGNOSIS — S3992XA Unspecified injury of lower back, initial encounter: Secondary | ICD-10-CM

## 2024-02-10 DIAGNOSIS — M7918 Myalgia, other site: Secondary | ICD-10-CM

## 2024-02-10 MED ORDER — MELOXICAM 15 MG PO TABS: 15.0000 mg | ORAL_TABLET | Freq: Every day | ORAL | 0 refills | Status: DC

## 2024-02-10 NOTE — Telephone Encounter (Signed)
 FYI Only or Action Required?: FYI only for provider.  Patient was last seen in primary care on 10/27/2023 by Vicci Duwaine SQUIBB, DO.  Called Nurse Triage reporting Appointment.  Symptoms began no triage.  Interventions attempted: Other: no triage.  Symptoms are: no triage.  Triage Disposition: Information or Advice Only Call  Patient/caregiver understands and will follow disposition?:    Copied from CRM #[id number]. Topic: Clinical - Red Word Triage >> Feb 10, 2024 11:27 AM Winona R wrote: Joint pain. Pt missed 11 am appointment today scheduled by triage and need a new appointment Reason for Disposition  [1] Follow-up call to recent contact AND [2] information only call, no triage required  Answer Assessment - Initial Assessment Questions 1. REASON FOR CALL: What is the main reason for your call? or How can I best help you?     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  2. SYMPTOMS : Do you have any symptoms?      Joint pain, no change from previous triage   Patient aware of alternate practice location and provider.  Protocols used: Information Only Call - No Triage-A-AH

## 2024-02-10 NOTE — Progress Notes (Signed)
 Acute Office Visit  Subjective:     Patient ID: John Huang, male    DOB: 01-Feb-1976, 48 y.o.   MRN: [id number]  Chief Complaint  Patient presents with   Tailbone Pain    Patient is having tailbone pain, recently fell last year     HPI Patient is in today for  Discussed the use of AI scribe software for clinical note transcription with the patient, who gave verbal consent to proceed.  History of Present Illness John Huang is a 48 year old male who presents with persistent tailbone pain following a fall one year ago.  He has been experiencing persistent burning and stinging pain in the tailbone area since a fall in the kitchen one year ago. He has not sought medical evaluation until now, as his wife initially believed it was not broken. He attempted various home treatments, including trigger release and dry needling, which provided temporary relief. He has been using Aspercreme for pain relief.  He is experiencing a flare of muscle pain, described as widespread, affecting areas from his head to his toes, including his neck and shoulders. His biceps feel as though they are in spasm, and simple activities like brushing his hair are painful. He manages his symptoms with dietary modifications, avoiding high-histamine  foods, and taking cromolyn . He is also on Lyrica  for fibromyalgia and Cymbalta , but continues to experience significant pain.  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    Review of Systems  All other systems reviewed and are negative.       Objective:    BP 134/76   Pulse 73   Ht 6' 2 (1.88 m)   Wt 222 lb 8 oz (100.9 kg)   SpO2 98%   BMI 28.57 kg/m    Physical Exam Vitals and nursing note reviewed.  Constitutional:      Appearance: Normal appearance.  HENT:     Head: Normocephalic.     Right Ear: External ear normal.     Left Ear: External ear normal.  Eyes:      Conjunctiva/sclera: Conjunctivae normal.  Cardiovascular:     Rate and Rhythm: Normal rate.  Pulmonary:     Effort: Pulmonary effort is normal. No respiratory distress.  Abdominal:     Palpations: Abdomen is soft.  Musculoskeletal:        General: Normal range of motion.  Skin:    General: Skin is warm.  Neurological:     Mental Status: He is alert and oriented to person, place, and time.  Psychiatric:        Mood and Affect: Mood normal.     No results found for any visits on 02/10/24.      Assessment & Plan:   Problem List Items Addressed This Visit       Other   Myofascial pain   Other Visit Diagnoses       Injury of coccyx, initial encounter    -  Primary   Relevant Medications   meloxicam  (MOBIC ) 15 MG tablet   Other Relevant Orders   DG Sacrum/Coccyx       Meds ordered this encounter  Medications   meloxicam  (MOBIC ) 15 MG tablet    Sig: Take 1 tablet (15 mg total) by mouth daily.    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 Possible nerve impingement or ligament involvement.  - Order x-ray of tailbone to assess for fracture or abnormalities. - Mobic  for pain control.  Generalized muscle pain and possible fibromyalgia flare Generalized muscle pain with possible fibromyalgia flare, affecting multiple areas. Current management includes Lyrica  and Cymbalta , but pain persists. Follow up with PCP.    No follow-ups on file.  Vinary K Pollie Poma, MD

## 2024-02-11 ENCOUNTER — Telehealth: Payer: Self-pay

## 2024-02-11 ENCOUNTER — Ambulatory Visit: Admitting: Family Medicine

## 2024-02-11 ENCOUNTER — Encounter: Payer: Self-pay | Admitting: Family Medicine

## 2024-02-11 VITALS — BP 90/64 | HR 69 | Ht 74.0 in | Wt 223.6 lb

## 2024-02-11 DIAGNOSIS — S3992XA Unspecified injury of lower back, initial encounter: Secondary | ICD-10-CM | POA: Diagnosis not present

## 2024-02-11 DIAGNOSIS — M533 Sacrococcygeal disorders, not elsewhere classified: Secondary | ICD-10-CM | POA: Diagnosis not present

## 2024-02-11 MED ORDER — CYCLOBENZAPRINE HCL 5 MG PO TABS: 5.0000 mg | ORAL_TABLET | Freq: Three times a day (TID) | ORAL | 0 refills | Status: DC | PRN

## 2024-02-11 NOTE — Telephone Encounter (Signed)
  Emailed order over to [email or web address], will fax also.  JM  Copied from CRM [id number]. Topic: General - Other >> Feb 11, 2024  8:36 AM John Huang wrote: Reason for CRM: needs an order for xray sent to [facility] for his appt with us  at 2pm. Fax number- [phone]

## 2024-02-11 NOTE — Telephone Encounter (Signed)
 Copied from CRM #[id number]. Topic: General - Other >> Feb 11, 2024  8:13 AM Olam RAMAN wrote: Reason for CRM: needed to get an x ray [facility] in [place]. X ray sent to location C?B [phone]

## 2024-02-13 DIAGNOSIS — M533 Sacrococcygeal disorders, not elsewhere classified: Secondary | ICD-10-CM | POA: Insufficient documentation

## 2024-02-13 NOTE — Patient Instructions (Signed)
 VISIT SUMMARY:  You visited us  today for chronic tailbone pain following a fall one year ago. We discussed your ongoing pain management and made some adjustments to your treatment plan.  YOUR PLAN:  COCCYDYNIA: Chronic tailbone pain likely due to nerve or ligament injury from your fall. -We are referring you to [facility] for a fluoroscopic-guided injection. -Continue taking meloxicam  daily with food. -We are prescribing cyclobenzaprine  5-10 mg, 1-2 tablets up to three times a day as needed. -We will coordinate with physical therapy for your coccygeal pain management.  CHRONIC LOW BACK PAIN WITH L4-L5 DISC BULGE: Chronic low back pain with a bulging disc at L4-L5, which may contribute to your tailbone pain. -Continue taking meloxicam  and cyclobenzaprine  as prescribed.

## 2024-02-13 NOTE — Progress Notes (Signed)
 Primary Care / Sports Medicine Office Visit  Patient Information:  Patient ID: John Huang, male DOB: 10-11-1975 Age: 48 y.o. MRN: [id number]   John Huang is a pleasant 48 y.o. male presenting with the following:  Chief Complaint  Patient presents with   Tailbone Pain    Patient presents today for coccyx pain x 1 year. Aggravating factor is sitting. Patient wife is a physical therapist and she can get the muscles to release but the pain comes right back. Patient is wondering if a cortisone injection would be beneficial. Patient has xray's.    Vitals:   02/11/24 1350  BP: 90/64  Pulse: 69  SpO2: 99%   Vitals:   02/11/24 1350  Weight: 223 lb 9.6 oz (101.4 kg)  Height: 6' 2 (1.88 m)   Body mass index is 28.71 kg/m.  No results found.   Discussed the use of AI scribe software for clinical note transcription with the patient, who gave verbal consent to proceed.   Independent interpretation of notes and tests performed by another provider:   None  Procedures performed:   None  Pertinent History, Exam, Impression, and Recommendations:   Problem List Items Addressed This Visit     Traumatic coccydynia - Primary   History of Present Illness John Huang is a 48 year old male who presents with chronic tailbone pain following a fall one year ago.  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, operational - Pain described as a burning sensation localized to the coccyx, more pronounced on the right side - Pain exacerbated by sitting, leading to avoidance of direct pressure on the area - No radiation of pain into the legs - Partial relief with trigger point release and other techniques performed by his wife - Initial management included application of ice only - No formal medical treatment received for the coccygeal injury until recently  Pharmacologic pain  management - Started meloxicam  recently, which has provided partial pain relief - Takes Lyrica  and Cymbalta , which he believes help with overall pain management  History of lumbar disc disease - History of L4-L5 bulging discs - Manages lumbar disc disease with various strategies - No current radiation of pain into the legs  PHYSICAL EXAM - LUMBOSACRAL / COCCYX INSPECTION: Normal alignment. No erythema, swelling, ecchymosis, or deformity at the sacrum or coccyx. Skin intact. PALPATION: Mild tenderness at the right sacroiliac joint. Left sacroiliac joint nontender. Focal tenderness at the coccyx, reproducing burning sensation. Paraspinal musculature soft and nontender. RANGE OF MOTION: Lumbar flexion, extension, lateral bending, and rotation full and painless. Hip range of motion symmetric without limitation. STRENGTH: 5/5 strength in bilateral lower extremities. NEUROVASCULAR: Sensation intact to light touch throughout lower extremities. Reflexes 2+ and symmetric at patella and Achilles. Distal pulses intact. SPECIAL TESTS: Straight leg raise negative on the right; negative on the left for radicular features but elicited coccygeal paresthesias. FABER test negative on the left, positive on the right with inguinal pain. FABIR, piriformis, and iliopsoas tests negative bilaterally. Kemp's test negative bilaterally.  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 X-rays normal. - [facility]. - Continue meloxicam  daily with food. - Prescribe cyclobenzaprine  5-10 mg, 1-2 tablets up to three times a day as needed. - Coordinate with physical therapy for  coccygeal pain management.  Chronic low back pain with L4-L5 disc bulge Chronic low back pain with L4-L5 disc bulge, no new radiating symptoms. May contribute to coccygeal pain. - Continue meloxicam  and  cyclobenzaprine .      Relevant Medications   cyclobenzaprine  (FLEXERIL ) 5 MG tablet   Other Relevant Orders   Ambulatory referral to Pain Clinic     Orders & Medications Medications:  Meds ordered this encounter  Medications   cyclobenzaprine  (FLEXERIL ) 5 MG tablet    Sig: Take 1-2 tablets (5-10 mg total) by mouth 3 (three) times daily as needed.    Dispense:  60 tablet    Refill:  0   Orders Placed This Encounter  Procedures   Ambulatory referral to Pain Clinic     No follow-ups on file.     Selinda JINNY Ku, MD, [facility]   Primary Care Sports Medicine Primary Care and Sports Medicine at [facility]

## 2024-02-13 NOTE — Assessment & Plan Note (Signed)
 History of Present Illness John Huang is a 48 year old male who presents with chronic tailbone pain following a fall one year ago.  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, operational - Pain described as a burning sensation localized to the coccyx, more pronounced on the right side - Pain exacerbated by sitting, leading to avoidance of direct pressure on the area - No radiation of pain into the legs - Partial relief with trigger point release and other techniques performed by his wife - Initial management included application of ice only - No formal medical treatment received for the coccygeal injury until recently  Pharmacologic pain management - Started meloxicam  recently, which has provided partial pain relief - Takes Lyrica  and Cymbalta , which he believes help with overall pain management  History of lumbar disc disease - History of L4-L5 bulging discs - Manages lumbar disc disease with various strategies - No current radiation of pain into the legs  PHYSICAL EXAM - LUMBOSACRAL / COCCYX INSPECTION: Normal alignment. No erythema, swelling, ecchymosis, or deformity at the sacrum or coccyx. Skin intact. PALPATION: Mild tenderness at the right sacroiliac joint. Left sacroiliac joint nontender. Focal tenderness at the coccyx, reproducing burning sensation. Paraspinal musculature soft and nontender. RANGE OF MOTION: Lumbar flexion, extension, lateral bending, and rotation full and painless. Hip range of motion symmetric without limitation. STRENGTH: 5/5 strength in bilateral lower extremities. NEUROVASCULAR: Sensation intact to light touch throughout lower extremities. Reflexes 2+ and symmetric at patella and Achilles. Distal pulses intact. SPECIAL TESTS: Straight leg raise negative on the right; negative on the left for radicular features but elicited coccygeal paresthesias. FABER  test negative on the left, positive on the right with inguinal pain. FABIR, piriformis, and iliopsoas tests negative bilaterally. Kemp's test negative bilaterally.  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 X-rays normal. - Refer to [facility] for fluoroscopic-guided injection. - Continue meloxicam  daily with food. - Prescribe cyclobenzaprine  5-10 mg, 1-2 tablets up to three times a day as needed. - Coordinate with physical therapy for coccygeal pain management.  Chronic low back pain with L4-L5 disc bulge Chronic low back pain with L4-L5 disc bulge, no new radiating symptoms. May contribute to coccygeal pain. - Continue meloxicam  and cyclobenzaprine .

## 2024-02-15 ENCOUNTER — Telehealth: Payer: Self-pay

## 2024-02-15 NOTE — Telephone Encounter (Signed)
 Patient advised to use mychart.  JM  Copied from CRM [id number]. Topic: General - Other >> Feb 15, 2024 11:52 AM Debby BROCKS wrote: Reason for CRM: Patient would like to know if he can have Dr. 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

## 2024-02-23 ENCOUNTER — Encounter: Payer: Self-pay | Admitting: Family Medicine

## 2024-02-23 NOTE — Telephone Encounter (Signed)
 Please review. JM

## 2024-02-23 NOTE — Telephone Encounter (Signed)
 PT response.  JM

## 2024-02-29 ENCOUNTER — Ambulatory Visit (INDEPENDENT_AMBULATORY_CARE_PROVIDER_SITE_OTHER): Admitting: Family Medicine

## 2024-02-29 ENCOUNTER — Encounter: Payer: Self-pay | Admitting: Family Medicine

## 2024-02-29 VITALS — BP 124/82 | HR 77 | Ht 74.0 in | Wt 225.0 lb

## 2024-02-29 DIAGNOSIS — L089 Local infection of the skin and subcutaneous tissue, unspecified: Secondary | ICD-10-CM | POA: Diagnosis not present

## 2024-02-29 MED ORDER — DOXYCYCLINE HYCLATE 100 MG PO TABS: 100.0000 mg | ORAL_TABLET | Freq: Two times a day (BID) | ORAL | 0 refills | Status: AC

## 2024-02-29 NOTE — Progress Notes (Signed)
   Acute Office Visit  Subjective:     Patient ID: John Huang, male    DOB: Dec 28, 1975, 48 y.o.   MRN: [id number]  Chief Complaint  Patient presents with   Rash    Patient thinks he is having a reaction to mold, recently exposed to mold from moving out of their home, rash on head locate left side of the head     HPI Patient is in today for acute visit,  Discussed the use of AI scribe software for clinical note transcription with the patient, who gave verbal consent to proceed.  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  He developed a sore on the back of his head approximately two weeks ago, which is painful. Recently, another sore spot appeared with a rash that is also very painful. He has been applying peroxide to the sores for the past two days, which caused significant burning.  His wife suggested that the sores could be related to a fungal infection due to mold exposure in his home, especially considering his history of mast cell activation syndrome. She is also concerned about the possibility of transmission from his children, a ten-month-old and a five-year-old, prompting the visit for evaluation.    Review of Systems  All other systems reviewed and are negative.       Objective:    BP 124/82   Pulse 77   Ht 6' 2 (1.88 m)   Wt 225 lb (102.1 kg)   SpO2 96%   BMI 28.89 kg/m     Physical Exam Vitals and nursing note reviewed.  Constitutional:      Appearance: Normal appearance.  HENT:     Head: Normocephalic.     Right Ear: External ear normal.     Left Ear: External ear normal.  Eyes:     Conjunctiva/sclera: Conjunctivae normal.  Cardiovascular:     Rate and Rhythm: Normal rate.  Pulmonary:     Effort: Pulmonary effort is normal. No respiratory distress.  Abdominal:     Palpations: Abdomen is soft.  Musculoskeletal:        General: Normal range  of motion.  Skin:    General: Skin is warm.  Neurological:     Mental Status: He is alert and oriented to person, place, and time.  Psychiatric:        Mood and Affect: Mood normal.     No results found for any visits on 02/29/24.      Assessment & Plan:   Problem List Items Addressed This Visit   None Visit Diagnoses       Local skin infection    -  Primary   Relevant Medications   doxycycline  (VIBRA -TABS) 100 MG tablet       Meds ordered this encounter  Medications   doxycycline  (VIBRA -TABS) 100 MG tablet    Sig: Take 1 tablet (100 mg total) by mouth 2 (two) times daily for 5 days.    Dispense:  10 tablet    Refill:  0      No follow-ups on file.  Vinary K Koralyn Prestage, MD

## 2024-03-08 ENCOUNTER — Other Ambulatory Visit: Payer: Self-pay

## 2024-03-08 MED ORDER — CYCLOBENZAPRINE HCL 5 MG PO TABS: 5.0000 mg | ORAL_TABLET | Freq: Three times a day (TID) | ORAL | 0 refills | Status: DC | PRN

## 2024-03-28 ENCOUNTER — Encounter: Payer: Self-pay | Admitting: Pain Medicine

## 2024-03-28 DIAGNOSIS — M899 Disorder of bone, unspecified: Secondary | ICD-10-CM | POA: Insufficient documentation

## 2024-03-28 DIAGNOSIS — Z789 Other specified health status: Secondary | ICD-10-CM | POA: Insufficient documentation

## 2024-03-28 DIAGNOSIS — Z79899 Other long term (current) drug therapy: Secondary | ICD-10-CM | POA: Insufficient documentation

## 2024-03-28 DIAGNOSIS — R937 Abnormal findings on diagnostic imaging of other parts of musculoskeletal system: Secondary | ICD-10-CM | POA: Insufficient documentation

## 2024-03-28 DIAGNOSIS — G894 Chronic pain syndrome: Secondary | ICD-10-CM | POA: Insufficient documentation

## 2024-03-28 NOTE — Progress Notes (Unsigned)
 PROVIDER NOTE: Interpretation of information contained herein should be left to medically-trained personnel. Specific patient instructions are provided elsewhere under Patient Instructions section of medical record. This document was created in part using AI and STT-dictation technology, any transcriptional errors that may result from this process are unintentional.  Patient: John Huang  Service: E/M Encounter  Provider: Eric DELENA Como, MD  DOB: Mar 10, 1976  Delivery: Face-to-face  Specialty: Interventional Pain Management  MRN: [id number]  Setting: Ambulatory outpatient facility  Specialty designation: 09  Type: New Patient  Location: Outpatient office facility  PCP: Vicci Duwaine SQUIBB, DO  DOS: 03/29/2024    Referring Prov.: Alvia Selinda PARAS, MD   Primary Reason(s) for Visit: Encounter for initial evaluation of one or more chronic problems (new to examiner) potentially causing chronic pain, and posing a threat to normal musculoskeletal function. (Level of risk: High) CC: No chief complaint on file.  HPI  John Huang is a 48 y.o. year old, male patient, who comes for the first time to our practice referred by Alvia Selinda PARAS, MD for our initial evaluation of his chronic pain. He has Hypertension; Bulging lumbar disc; Alpha-galactosidase A deficiency (HCC); Deviated nasal septum; GERD (gastroesophageal reflux disease); Nasal vestibulitis; OSA (obstructive sleep apnea); Anxiety; Idiopathic urticaria; Chronic night sweats; Polyarthralgia; Primary osteoarthritis; Gastritis without bleeding; Abnormal movement of jaw; Fasciculations; Myofascial pain; Pulmonary congestion; Traumatic coccydynia; Lateral epicondylitis of right elbow; Positive anti-CCP test; Chronic pain syndrome; Pharmacologic therapy; Disorder of skeletal system; Problems influencing health status; and Abnormal MRI, lumbar spine (02/29/2020) on their problem list. Today he comes in for evaluation of his No chief complaint on  file.  Pain Assessment: Location:     Radiating:   Onset:   Duration:   Quality:   Severity:  /10 (subjective, self-reported pain score)  Effect on ADL:   Timing:   Modifying factors:   BP:    HR:    Onset and Duration: {Hx; Onset and Duration:210120511} Cause of pain: {Hx; Cause:210120521} Severity: {Pain Severity:210120502} Timing: {Symptoms; Timing:210120501} Aggravating Factors: {Causes; Aggravating pain factors:210120507} Alleviating Factors: {Causes; Alleviating Factors:210120500} Associated Problems: {Hx; Associated problems:210120515} Quality of Pain: {Hx; Symptom quality or Descriptor:210120531} Previous Examinations or Tests: {Hx; Previous examinations or test:210120529} Previous Treatments: {Hx; Previous Treatment:210120503}  John Huang is being evaluated for possible interventional pain management therapies for the treatment of his chronic pain.  Discussed the use of AI scribe software for clinical note transcription with the patient, who gave verbal consent to proceed.  History of Present Illness            John Huang has been informed that this initial visit was an evaluation only.  On the follow up appointment I will go over the results, including ordered tests and available interventional therapies. At that time he will have the opportunity to decide whether to proceed with offered therapies or not. In the event that John Huang, this will conclude our involvement in the case.  Medication management recommendations may be provided upon request.  Patient informed that diagnostic tests may be ordered to assist in identifying underlying causes, narrow the list of differential diagnoses and aid in determining candidacy for (or contraindications to) planned therapeutic interventions.  Historic Controlled Substance Pharmacotherapy Review PMP and historical list of controlled substances: Pregabalin  150 Mg Capsule  ,Oxycodone  Hcl  (Ir) 5 Mg Tablet  Most recently prescribed controlled substance(s): Opioid Analgesic: None MME/day: 0 mg/day  Historical Monitoring: The patient  reports no history of drug use.  List of prior UDS Testing: No results found for: MDMA, COCAINSCRNUR, PCPSCRNUR, PCPQUANT, CANNABQUANT, THCU, ETH, CBDTHCR, D8THCCBX, D9THCCBX Historical Background Evaluation: [place] PMP: PDMP reviewed during this encounter. Review of the past 3-months conducted.             PMP NARX Score Report:  Narcotic: 200 Sedative: 381 Stimulant: 000 [place] Department of public safety, offender search: Engineer, [employer] Information) Non-contributory Risk Assessment Profile: Aberrant behavior: None observed or detected today Risk factors for fatal opioid overdose: None identified today PMP NARX Overdose Risk Score: 370 Fatal overdose hazard ratio (HR): Calculation deferred Non-fatal overdose hazard ratio (HR): Calculation deferred Risk of opioid abuse or dependence: 0.7-3.0% with doses <= 36 MME/day and 6.1-26% with doses >= 120 MME/day. Substance use disorder (SUD) risk level: See below Personal History of Substance Abuse (SUD-Substance use disorder):  Alcohol:    Illegal Drugs:    Rx Drugs:    ORT Risk Level calculation:    ORT Scoring interpretation table:  Score <3 = Low Risk for SUD  Score between 4-7 = Moderate Risk for SUD  Score >8 = High Risk for Opioid Abuse   PHQ-2 Depression Scale:  Total score:    PHQ-2 Scoring interpretation table: (Score and probability of major depressive disorder)  Score 0 = No depression  Score 1 = 15.4% Probability  Score 2 = 21.1% Probability  Score 3 = 38.4% Probability  Score 4 = 45.5% Probability  Score 5 = 56.4% Probability  Score 6 = 78.6% Probability   PHQ-9 Depression Scale:  Total score:    PHQ-9 Scoring interpretation table:  Score 0-4 = No depression  Score 5-9 = Mild depression  Score 10-14 = Moderate depression  Score 15-19 = Moderately severe  depression  Score 20-27 = Severe depression (2.4 times higher risk of SUD and 2.89 times higher risk of overuse)   Pharmacologic Plan: As per protocol, I have not taken over any controlled substance management, pending the results of ordered tests and/or consults.            Initial impression: Pending review of available data and ordered tests.  Meds   Current Outpatient Medications:    amLODipine  (NORVASC ) 5 MG tablet, Take 1 tablet (5 mg total) by mouth daily., Disp: 90 tablet, Rfl: 1   cromolyn  (GASTROCROM ) 100 MG/5ML solution, Take 5 mLs (100 mg total) by mouth 4 (four) times daily -  before meals and at bedtime., Disp: 480 mL, Rfl: 12   cyclobenzaprine  (FLEXERIL ) 5 MG tablet, Take 1-2 tablets (5-10 mg total) by mouth 3 (three) times daily as needed., Disp: 60 tablet, Rfl: 0   DULoxetine  (CYMBALTA ) 60 MG capsule, Take 1 capsule (60 mg total) by mouth daily., Disp: 90 capsule, Rfl: 1   fexofenadine  (ALLEGRA ) 180 MG tablet, Take 1-2 tablets (180-360 mg total) by mouth daily., Disp: 180 tablet, Rfl: 3   fluconazole  (DIFLUCAN ) 100 MG tablet, Take 1 tablet (100 mg total) by mouth daily., Disp: 7 tablet, Rfl: 0   meloxicam  (MOBIC ) 15 MG tablet, Take 1 tablet (15 mg total) by mouth daily., Disp: 30 tablet, Rfl: 0   Multiple Vitamin (MULTI-VITAMINS) TABS, Take by mouth., Disp: , Rfl:    OVER THE COUNTER MEDICATION, FC-Cidal dietary supplement, Disp: , Rfl:    OVER THE COUNTER MEDICATION, Beberine dietary supplement, Disp: , Rfl:    OVER THE COUNTER MEDICATION, Dysbiocide dietary supplement, Disp: , Rfl:    OVER THE COUNTER MEDICATION, Pancreatin/ Ox bile extract dietary supplement, Disp: , Rfl:  pregabalin  (LYRICA ) 150 MG capsule, Take 1 capsule (150 mg total) by mouth 3 (three) times daily., Disp: 270 capsule, Rfl: 1  Imaging Review  Lumbosacral Imaging: Lumbar MR wo contrast: Results for orders placed during the hospital encounter of 02/29/20 MR Lumbar Spine Wo  Contrast  Narrative CLINICAL DATA:  Lumbar radiculopathy. Slipped disc in 2019. Pain in right buttock and right leg.  EXAM: MRI LUMBAR SPINE WITHOUT CONTRAST  TECHNIQUE: Multiplanar, multisequence MR imaging of the lumbar spine was performed. No intravenous contrast was administered.  COMPARISON:  Lumbar radiographs 01/18/2020  FINDINGS: Segmentation: Transitional lumbosacral anatomy. 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 The the inferior-most fully formed intervertebral disc is labeled S1-S2.  Alignment:  Normal.  Vertebrae: No no focal marrow signal abnormality to suggest acute fracture, discitis/osteomyelitis, or suspicious bone lesion.  Conus medullaris and cauda equina: Conus extends to the L1 level. Conus appears normal.  Paraspinal and other soft tissues: Negative.  Disc levels:  Degenerative disc disease at L5-S1 and S1-S2.  L1-L2: No significant disc protrusion, foraminal stenosis, or canal stenosis.  L2-L3: Minimal broad-based disc bulge and mild bilateral facet hypertrophy without significant canal or foraminal stenosis.  L3-L4: Small broad-based disc bulge and mild bilateral facet hypertrophy with mild bilateral subarticular recess stenosis. No significant canal or foraminal stenosis.  L4-L5: Small broad-based disc bulge and mild bilateral facet hypertrophy with mild bilateral subarticular recess stenosis. No significant canal or foraminal stenosis.  L5-S1: Degenerative disc desiccation height loss. Moderate broad-based disc bulge with posterior annular fissure. Mild bilateral facet hypertrophy. Resulting mild canal stenosis and moderate bilateral subarticular recess stenosis with disc contacting bilateral descending L5 nerve roots.  S1-S2: No significant disc protrusion, foraminal  stenosis, or canal stenosis.  IMPRESSION: Transitional lumbosacral anatomy. 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 The the inferior-most fully formed intervertebral disc is labeled S1-S2.  1. At L5-S1 there is a broad-based disc bulge with mild canal stenosis and moderate bilateral subarticular recess stenosis with disc contacting bilateral descending L5 nerve roots.   Electronically Signed By: Gilmore GORMAN Molt MD On: 02/29/2020 09:46  Lumbar DG (Complete) 4+V: Results for orders placed during the hospital encounter of 03/19/23 DG Lumbar Spine Complete  Narrative CLINICAL DATA:  Status post fall last week with tailbone pain.  EXAM: LUMBAR SPINE - COMPLETE 4+ VIEW  COMPARISON:  January 18, 2020  FINDINGS: There is no evidence of lumbar spine fracture. Minimal curvature of spine. Mild decreased intervertebral spaces in the lower lumbar spine.  IMPRESSION: No acute fracture or dislocation. Mild degenerative joint changes of lower lumbar spine.   Electronically Signed By: Craig Farr M.D. On: 03/22/2023 09:52  Knee Imaging: Knee-L DG 4 views: Results for orders placed during the hospital encounter of 10/25/17 DG Knee Complete 4 Views Left  Narrative CLINICAL DATA:  Left knee pain inferior to the patella. Itching about the knee.  EXAM: LEFT KNEE - COMPLETE 4+ VIEW  COMPARISON:  None.  FINDINGS: No acute bony or joint abnormality is identified. The patient is status post ACL grafting. Osteophytosis is present about all 3 compartments of the knee although joint spaces are preserved. Fragmentation of the tibial tuberosity is consistent with Osgood-Schlatter disease. Small ossification along  the medial epicondyle of the left femur is consistent with remote MCL injury. No joint effusion. No soft  tissue gas.  IMPRESSION: No acute abnormality.  Osteophytosis about the knee is advanced for age.  Status post ACL grafting.  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   Electronically Signed By: Debby Prader M.D. On: 10/25/2017 15:26  Foot Imaging: Foot-R DG Complete: Results for orders placed in visit on 03/07/19 DG Foot Complete Right  Narrative Please see detailed radiograph report in office note.  Foot-L DG Complete: Results for orders placed in visit on 11/09/18 DG Foot Complete Left  Narrative Please see detailed radiograph report in office note.  Elbow Imaging: Elbow-R DG Complete: Results for orders placed during the hospital encounter of 03/19/23 DG Elbow Complete Right  Narrative CLINICAL DATA:  Fall, right elbow pain  EXAM: RIGHT ELBOW - COMPLETE 3+ VIEW  COMPARISON:  None Available.  FINDINGS: There is no evidence of fracture, dislocation, or joint effusion. There is no evidence of arthropathy or other focal bone abnormality. Soft tissues are unremarkable.  IMPRESSION: Negative.   Electronically Signed By: Dorethia Molt M.D. On: 04/11/2023 02:54  Elbow-L DG Complete: Results for orders placed during the hospital encounter of 04/21/19 DG Elbow Complete Left  Narrative CLINICAL DATA:  Lateral sided elbow pain for the past 3 months. No known injury.  EXAM: LEFT ELBOW - COMPLETE 3+ VIEW  COMPARISON:  None.  FINDINGS: No fracture or elbow joint effusion. Joint spaces are preserved. Minimal enthesopathic change involving the lateral epicondyle. Regional soft tissues appear normal.  IMPRESSION: Minimal enthesopathic change involving the lateral epicondyle as could be seen in the setting of previous avulsive injury/epicondylitis. Clinical correlation is advised.   Electronically Signed By: Norleen Roulette M.D. On: 04/22/2019 12:58  Hand Imaging: Hand-L DG Complete: Results for orders placed during the hospital encounter of 07/16/14 DG Hand  Complete Left  Narrative CLINICAL DATA:  Ski jumping injury with left hand pain an swelling, initial encounter  EXAM: LEFT HAND - COMPLETE 3+ VIEW  COMPARISON:  None.  FINDINGS: There is no evidence of fracture or dislocation. There is no evidence of arthropathy or other focal bone abnormality. Soft tissues are unremarkable.  IMPRESSION: No acute abnormality noted.   Electronically Signed By: Oneil Devonshire M.D. On: 07/17/2014 08:24  Complexity Note: Imaging results reviewed.                         ROS  Cardiovascular: {Hx; Cardiovascular History:210120525} Pulmonary or Respiratory: {Hx; Pumonary and/or Respiratory History:210120523} Neurological: {Hx; Neurological:210120504} Psychological-Psychiatric: {Hx; Psychological-Psychiatric History:210120512} Gastrointestinal: {Hx; Gastrointestinal:210120527} Genitourinary: {Hx; Genitourinary:210120506} Hematological: {Hx; Hematological:210120510} Endocrine: {Hx; Endocrine history:210120509} Rheumatologic: {Hx; Rheumatological:210120530} Musculoskeletal: {Hx; Musculoskeletal:210120528} Work History: {Hx; Work history:210120514}  Allergies  John Huang is allergic to cat dander, other, alpha-gal, and galactose.  Laboratory Chemistry Profile   Renal Lab Results  Component Value Date   BUN 7 10/27/2023   CREATININE 1.00 10/27/2023   BCR 7 (L) 10/27/2023   GFRAA 92 05/30/2020   GFRNONAA >60 04/14/2022   SPECGRAV 1.020 03/02/2022   PHUR 7.0 03/02/2022   PROTEINUR Negative 03/02/2022     Electrolytes Lab Results  Component Value Date   NA 142 10/27/2023   K 4.3 10/27/2023   CL 105 10/27/2023   CALCIUM 9.5 10/27/2023     Hepatic Lab Results  Component Value Date   AST 22 03/19/2023   ALT 20 03/19/2023   ALBUMIN 4.3 03/19/2023   ALKPHOS 80 03/19/2023  ID Lab Results  Component Value Date   LYMEIGGIGMAB <0.91 05/31/2019   HIV negative 05/22/2016   SARSCOV2NAA Detected (A) 08/14/2022   RMSFIGG Negative  05/31/2019     Bone Lab Results  Component Value Date   VD25OH 19.1 (L) 10/27/2023   TESTOFREE 6.4 (L) 05/31/2019   TESTOSTERONE  256 (L) 05/31/2019     Endocrine Lab Results  Component Value Date   GLUCOSE 80 10/27/2023   GLUCOSEU Negative 03/02/2022   HGBA1C 5.2 04/21/2019   TSH 1.610 03/19/2023   TESTOFREE 6.4 (L) 05/31/2019   TESTOSTERONE  256 (L) 05/31/2019   SHBG 21.1 05/31/2019     Neuropathy Lab Results  Component Value Date   VITAMINB12 330 04/14/2022   FOLATE 12.5 04/14/2022   HGBA1C 5.2 04/21/2019   HIV negative 05/22/2016     CNS No results found for: COLORCSF, APPEARCSF, RBCCOUNTCSF, WBCCSF, POLYSCSF, LYMPHSCSF, EOSCSF, PROTEINCSF, GLUCCSF, JCVIRUS, CSFOLI, IGGCSF, LABACHR, ACETBL   Inflammation (CRP: Acute  ESR: Chronic) Lab Results  Component Value Date   CRP 0.8 04/14/2022   ESRSEDRATE 16 (H) 04/14/2022     Rheumatology Lab Results  Component Value Date   RF <10.0 05/31/2019   ANA Negative 04/14/2022   LABURIC 4.4 05/31/2019   LYMEIGGIGMAB <0.91 05/31/2019   LYMEABIGMQN <0.80 05/31/2019     Coagulation Lab Results  Component Value Date   PLT 243 10/27/2023     Cardiovascular Lab Results  Component Value Date   CKTOTAL 123 05/31/2019   HGB 14.1 10/27/2023   HCT 45.1 10/27/2023     Screening Lab Results  Component Value Date   SARSCOV2NAA Detected (A) 08/14/2022   HIV negative 05/22/2016     Cancer No results found for: CEA, CA125, LABCA2   Allergens Lab Results  Component Value Date   BEEFIGE <0.10 01/21/2023       Note: Lab results reviewed.  PFSH  Drug: John Huang  reports no history of drug use. Alcohol:  reports that he does not currently use alcohol. Tobacco:  reports that he has never smoked. He has never used smokeless tobacco. Medical:  has a past medical history of Allergy (03/07/1995), Asthma, Bulging lumbar disc, CAP (community acquired pneumonia) (02/18/2016), Cholinergic  urticaria, Complication of anesthesia, Depression (02/26/2022), Epistaxis (05/24/2017), GERD (gastroesophageal reflux disease), History of endocarditis, History of septic arthritis (2000), Hypertension, Pharmacologic therapy (03/28/2024), PONV (postoperative nausea and vomiting), Seasonal allergies, Sleep apnea, and Vertigo. Family: family history includes ADD / ADHD in his father and mother; Arthritis in his mother; Asthma in his mother; Cancer in his father, maternal grandmother, paternal grandmother, paternal uncle, and sister; Diabetes in his mother; Hypertension in his brother and mother; Irritable bowel syndrome in his brother; Stroke in his mother.  Past Surgical History:  Procedure Laterality Date   ANTERIOR CRUCIATE LIGAMENT REPAIR     COLON SURGERY  03/09/2022   3 different fistulas removed.   COLONOSCOPY WITH PROPOFOL  N/A 08/16/2020   Procedure: COLONOSCOPY WITH PROPOFOL ;  Surgeon: Jinny Carmine, MD;  Location: [facility];  Service: Endoscopy;  Laterality: N/A;   ELBOW SURGERY Left 06/20/2020   ESOPHAGOGASTRODUODENOSCOPY (EGD) WITH PROPOFOL  N/A 08/16/2020   Procedure: ESOPHAGOGASTRODUODENOSCOPY (EGD) WITH PROPOFOL ;  Surgeon: Jinny Carmine, MD;  Location: [facility];  Service: Endoscopy;  Laterality: N/A;   FOOT SURGERY  2020   MEDIAL COLLATERAL LIGAMENT AND LATERAL COLLATERAL LIGAMENT REPAIR, KNEE     SHOULDER SURGERY     dislocation and tac left in there, so had to have surgery to removed  the tac   TONSILLECTOMY  2020   WISDOM TOOTH EXTRACTION     Active Ambulatory Problems    Diagnosis Date Noted   Hypertension    Bulging lumbar disc    Alpha-galactosidase A deficiency (HCC) 10/21/2017   Deviated nasal septum 05/24/2017   GERD (gastroesophageal reflux disease) 02/18/2016   Nasal vestibulitis 05/24/2017   OSA (obstructive sleep apnea) 02/28/2018   Anxiety 10/17/2018   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   Chronic night sweats 08/10/2019   Polyarthralgia  08/10/2019   Primary osteoarthritis 08/10/2019   Gastritis without bleeding    Abnormal movement of jaw 04/24/2021   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   Myofascial pain 03/30/2022   Pulmonary congestion 08/14/2022   Traumatic coccydynia 02/13/2024   Lateral epicondylitis of right elbow 04/01/2023   Positive anti-CCP test 08/10/2019   Chronic pain syndrome 03/28/2024   Pharmacologic therapy 03/28/2024   Disorder of skeletal system 03/28/2024   Problems influencing health status 03/28/2024   Abnormal MRI, lumbar spine (02/29/2020) 03/28/2024   Resolved Ambulatory Problems    Diagnosis Date Noted   Cholinergic urticaria    Acute sinusitis 05/24/2017   CAP (community acquired pneumonia) 02/18/2016   Epistaxis 05/24/2017   Hematochezia    Acute cough 08/14/2022   Past Medical History:  Diagnosis Date   Allergy 03/07/1995   Asthma    Complication of anesthesia    Depression 02/26/2022   History of endocarditis    History of septic arthritis 2000   PONV (postoperative nausea and vomiting)    Seasonal allergies    Sleep apnea    Vertigo    Constitutional Exam  General appearance: Well nourished, well developed, and well hydrated. In no apparent acute distress There were no vitals filed for this visit. BMI Assessment: Estimated body mass index is 28.89 kg/m as calculated from the following:   Height as of 02/29/24: 6' 2 (1.88 m).   Weight as of 02/29/24: 225 lb (102.1 kg).  BMI interpretation table: BMI level Category Range association with higher incidence of chronic pain  <18 kg/m2 Underweight   18.5-24.9 kg/m2 Ideal body weight   25-29.9 kg/m2 Overweight Increased incidence by 20%  30-34.9 kg/m2 Obese (Class I) Increased incidence by 68%  35-39.9 kg/m2 Severe obesity (Class II) Increased incidence by 136%  >40 kg/m2 Extreme obesity (Class III) Increased incidence by 254%   Patient's current BMI Ideal Body weight  There is no height or weight on file to calculate BMI.  Patient weight not recorded   BMI Readings from Last 4 Encounters:  02/29/24 28.89 kg/m  02/11/24 28.71 kg/m  02/10/24 28.57 kg/m  10/27/23 31.87 kg/m   Wt Readings from Last 4 Encounters:  02/29/24 225 lb (102.1 kg)  02/11/24 223 lb 9.6 oz (101.4 kg)  02/10/24 222 lb 8 oz (100.9 kg)  10/27/23 235 lb (106.6 kg)    Psych/Mental status: Alert, oriented x 3 (person, place, & time)       Eyes: PERLA Respiratory: No evidence of acute respiratory distress  Assessment  Primary Diagnosis & Pertinent Problem List: The primary encounter diagnosis was Chronic pain syndrome. Diagnoses of Pharmacologic therapy, Disorder of skeletal system, Problems influencing health status, and Abnormal MRI, lumbar spine (02/29/2020) were also pertinent to this visit.  Visit Diagnosis (New problems to examiner): 1. Chronic pain syndrome   2. Pharmacologic therapy   3. Disorder of skeletal system   4. Problems influencing health status   5. Abnormal MRI, lumbar spine (02/29/2020)    Plan of Care (Initial  workup plan)  Note: John Huang was reminded that as per protocol, today's visit has been an evaluation only. We have not taken over the patient's controlled substance management.  Problem-specific plan: Assessment and Plan            Lab Orders  No laboratory test(s) ordered today   Imaging Orders  No imaging studies ordered today   Referral Orders  No referral(s) requested today   Procedure Orders    No procedure(s) ordered today   Pharmacotherapy (current): Medications ordered:  No orders of the defined types were placed in this encounter.  Medications administered during this visit: Tarl M. Roel had no medications administered during this visit.   Analgesic Pharmacotherapy:  Opioid Analgesics: For patients currently taking or requesting to take opioid analgesics, in accordance with [place]  Medical Board Guidelines, we will assess their risks and indications for the use  of these substances. After completing our evaluation, we may offer recommendations, but we no longer take patients for medication management. The prescribing physician will ultimately decide, based on his/her training and level of comfort whether to adopt any of the recommendations, including whether or not to prescribe such medicines.  Membrane stabilizer: To be determined at a later time  Muscle relaxant: To be determined at a later time  NSAID: To be determined at a later time  Other analgesic(s): To be determined at a later time   Interventional management Huang: John Huang was informed that there is no guarantee that he would be a candidate for interventional therapies. The decision will be based on the results of diagnostic studies, as well as John Huang risk profile.  Procedure(s) under consideration:  Pending results of ordered studies     Interventional Therapies  Risk Factors  Considerations  Medical Comorbidities:     Planned  Pending:      Under consideration:   Pending   Completed: (Analgesic benefit)1  None at this time   Therapeutic  Palliative (PRN) Huang:   None established   Completed by other providers:   None reported  1(Analgesic benefit): Expressed in percentage (%). (Local anesthetic[LA] +/- sedation  L.A.Local Anesthetic  Steroid benefit  Ongoing benefit)   Provider-requested follow-up: No follow-ups on file.  Future Appointments  Date Time Provider Department Center  03/29/2024 11:00 AM Tanya Glisson, MD [facility] None  04/28/2024 10:00 AM Vicci Duwaine SQUIBB, DO [facility] [address]   I discussed the assessment and treatment plan with the patient. The patient was provided an opportunity to ask questions and all were answered. The patient agreed with the plan and demonstrated an understanding of the instructions.  Patient advised to call back or seek an in-person evaluation if the symptoms or condition worsens.  Duration of encounter:  *** minutes.  Total time on encounter, as per AMA guidelines included both the face-to-face and non-face-to-face time personally spent by the physician and/or other qualified health care professional(s) on the day of the encounter (includes time in activities that require the physician or other qualified health care professional and does not include time in activities normally performed by clinical staff). Physician's time may include the following activities when performed: Preparing to see the patient (e.g., pre-charting review of records, searching for previously ordered imaging, lab work, and nerve conduction tests) Review of prior analgesic pharmacotherapies. Reviewing PMP Interpreting ordered tests (e.g., lab work, imaging, nerve conduction tests) Performing post-procedure evaluations, including interpretation of diagnostic procedures Obtaining and/or reviewing separately obtained history Performing a medically appropriate  examination and/or evaluation Counseling and educating the patient/family/caregiver Ordering medications, tests, or procedures Referring and communicating with other health care professionals (when not separately reported) Documenting clinical information in the electronic or other health record Independently interpreting results (not separately reported) and communicating results to the patient/ family/caregiver Care coordination (not separately reported)  Note by: Eric DELENA Como, MD (TTS and AI technology used. I apologize for any typographical errors that were not detected and corrected.) Date: 03/29/2024; Time: 6:44 PM

## 2024-03-28 NOTE — Patient Instructions (Incomplete)

## 2024-03-29 ENCOUNTER — Ambulatory Visit
Admission: RE | Admit: 2024-03-29 | Discharge: 2024-03-29 | Disposition: A | Discharge status: Home / Self Care | Source: Ambulatory Visit | Attending: Pain Medicine | Admitting: Pain Medicine

## 2024-03-29 ENCOUNTER — Ambulatory Visit: Admitting: Pain Medicine

## 2024-03-29 ENCOUNTER — Encounter: Payer: Self-pay | Admitting: Pain Medicine

## 2024-03-29 VITALS — BP 129/87 | HR 88 | Temp 97.5°F | Ht 74.0 in | Wt 230.0 lb

## 2024-03-29 DIAGNOSIS — Z79899 Other long term (current) drug therapy: Secondary | ICD-10-CM

## 2024-03-29 DIAGNOSIS — R937 Abnormal findings on diagnostic imaging of other parts of musculoskeletal system: Secondary | ICD-10-CM

## 2024-03-29 DIAGNOSIS — M533 Sacrococcygeal disorders, not elsewhere classified: Secondary | ICD-10-CM

## 2024-03-29 DIAGNOSIS — Z789 Other specified health status: Secondary | ICD-10-CM

## 2024-03-29 DIAGNOSIS — G8921 Chronic pain due to trauma: Secondary | ICD-10-CM | POA: Insufficient documentation

## 2024-03-29 DIAGNOSIS — G894 Chronic pain syndrome: Secondary | ICD-10-CM

## 2024-03-29 DIAGNOSIS — M899 Disorder of bone, unspecified: Secondary | ICD-10-CM

## 2024-03-29 NOTE — Progress Notes (Signed)
 Safety precautions to be maintained throughout the outpatient stay will include: orient to surroundings, keep bed in low position, maintain call bell within reach at all times, provide assistance with transfer out of bed and ambulation.

## 2024-03-30 ENCOUNTER — Encounter: Payer: Self-pay | Admitting: Pain Medicine

## 2024-03-30 ENCOUNTER — Ambulatory Visit: Admitting: Pain Medicine

## 2024-03-30 ENCOUNTER — Ambulatory Visit (HOSPITAL_BASED_OUTPATIENT_CLINIC_OR_DEPARTMENT_OTHER): Admitting: Pain Medicine

## 2024-03-30 ENCOUNTER — Ambulatory Visit
Admission: RE | Admit: 2024-03-30 | Discharge: 2024-03-30 | Disposition: A | Discharge status: Home / Self Care | Source: Ambulatory Visit | Attending: Pain Medicine | Admitting: Pain Medicine

## 2024-03-30 VITALS — BP 141/89 | HR 75 | Temp 97.7°F | Resp 16

## 2024-03-30 DIAGNOSIS — M5418 Radiculopathy, sacral and sacrococcygeal region: Secondary | ICD-10-CM | POA: Insufficient documentation

## 2024-03-30 DIAGNOSIS — S3992XS Unspecified injury of lower back, sequela: Secondary | ICD-10-CM | POA: Diagnosis not present

## 2024-03-30 DIAGNOSIS — G8921 Chronic pain due to trauma: Secondary | ICD-10-CM

## 2024-03-30 DIAGNOSIS — R937 Abnormal findings on diagnostic imaging of other parts of musculoskeletal system: Secondary | ICD-10-CM | POA: Diagnosis not present

## 2024-03-30 DIAGNOSIS — M533 Sacrococcygeal disorders, not elsewhere classified: Secondary | ICD-10-CM | POA: Diagnosis not present

## 2024-03-30 MED ORDER — SODIUM CHLORIDE 0.9% FLUSH
2.0000 mL | Freq: Once | INTRAVENOUS | Status: AC
  Administered 2024-03-30: 2 mL

## 2024-03-30 MED ORDER — ROPIVACAINE HCL 2 MG/ML IJ SOLN
2.0000 mL | Freq: Once | INTRAMUSCULAR | Status: AC
  Administered 2024-03-30: 2 mL via EPIDURAL

## 2024-03-30 MED ORDER — TRIAMCINOLONE ACETONIDE 40 MG/ML IJ SUSP
INTRAMUSCULAR | Status: AC
  Filled 2024-03-30: qty 1

## 2024-03-30 MED ORDER — LIDOCAINE HCL (PF) 2 % IJ SOLN
INTRAMUSCULAR | Status: AC
  Filled 2024-03-30: qty 10

## 2024-03-30 MED ORDER — MIDAZOLAM HCL 2 MG/2ML IJ SOLN
INTRAMUSCULAR | Status: AC
  Filled 2024-03-30: qty 2

## 2024-03-30 MED ORDER — TRIAMCINOLONE ACETONIDE 40 MG/ML IJ SUSP
40.0000 mg | Freq: Once | INTRAMUSCULAR | Status: AC
  Administered 2024-03-30: 40 mg

## 2024-03-30 MED ORDER — LIDOCAINE HCL 2 % IJ SOLN
20.0000 mL | Freq: Once | INTRAMUSCULAR | Status: AC
  Administered 2024-03-30: 100 mg

## 2024-03-30 MED ORDER — IOHEXOL 180 MG/ML  SOLN
INTRAMUSCULAR | Status: AC
  Filled 2024-03-30: qty 20

## 2024-03-30 MED ORDER — ROPIVACAINE HCL 2 MG/ML IJ SOLN
INTRAMUSCULAR | Status: AC
  Filled 2024-03-30: qty 20

## 2024-03-30 MED ORDER — PENTAFLUOROPROP-TETRAFLUOROETH EX AERO
INHALATION_SPRAY | Freq: Once | CUTANEOUS | Status: AC
  Administered 2024-03-30: 30 via TOPICAL

## 2024-03-30 MED ORDER — SODIUM CHLORIDE (PF) 0.9 % IJ SOLN
INTRAMUSCULAR | Status: AC
  Filled 2024-03-30: qty 10

## 2024-03-30 MED ORDER — IOHEXOL 180 MG/ML  SOLN
10.0000 mL | Freq: Once | INTRAMUSCULAR | Status: AC
  Administered 2024-03-30: 10 mL via EPIDURAL

## 2024-03-30 MED ORDER — MIDAZOLAM HCL (PF) 2 MG/2ML IJ SOLN
0.5000 mg | Freq: Once | INTRAMUSCULAR | Status: AC
  Administered 2024-03-30: 2 mg via INTRAVENOUS

## 2024-03-30 NOTE — Patient Instructions (Signed)
 ______________________________________________________________________    Post-Procedure Discharge Instructions  INSTRUCTIONS Apply ice:  Purpose: This will minimize any swelling and discomfort after procedure.  When: Day of procedure, as soon as you get home. How: Fill a plastic sandwich bag with crushed ice. Cover it with a small towel and apply to injection site. How long: (15 min on, 15 min off) Apply for 15 minutes then remove x 15 minutes.  Repeat sequence on day of procedure, until you go to bed. Apply heat:  Purpose: To treat any soreness and discomfort from the procedure. When: Starting the next day after the procedure. How: Apply heat to procedure site starting the day following the procedure. How long: May continue to repeat daily, until discomfort goes away. Food intake: Start with clear liquids (like water) and advance to regular food, as tolerated.  Physical activities: Keep activities to a minimum for the first 8 hours after the procedure. After that, then as tolerated. Driving: If you have received any sedation, be responsible and do not drive. You are not allowed to drive for 24 hours after having sedation. Blood thinner: (Applies only to those taking blood thinners) You may restart your blood thinner 6 hours after your procedure. Insulin: (Applies only to Diabetic patients taking insulin) As soon as you can eat, you may resume your normal dosing schedule. Infection prevention: Keep procedure site clean and dry. Shower daily and clean area with soap and water.  PAIN DIARY Post-procedure Pain Diary: Extremely important that this be done correctly and accurately. Recorded information will be used to determine the next step in treatment. For the purpose of accuracy, follow these rules: Evaluate only the area treated. Do not report or include pain from an untreated area. For the purpose of this evaluation, ignore all other areas of pain, except for the treated area. After your  procedure, avoid taking a long nap and attempting to complete the pain diary after you wake up. Instead, set your alarm clock to go off every hour, on the hour, for the initial 8 hours after the procedure. Document the duration of the numbing medicine, and the relief you are getting from it. Do not go to sleep and attempt to complete it later. It will not be accurate. If you received sedation, it is likely that you were given a medication that may cause amnesia. Because of this, completing the diary at a later time may cause the information to be inaccurate. This information is needed to plan your care. Follow-up appointment: Keep your post-procedure follow-up evaluation appointment after the procedure (usually 2 weeks for most procedures, 6 weeks for radiofrequencies). DO NOT FORGET to bring you pain diary with you.   EXPECT... (What should I expect to see with my procedure?) From numbing medicine (AKA: Local Anesthetics): Numbness or decrease in pain. You may also experience some weakness, which if present, could last for the duration of the local anesthetic. Onset: Full effect within 15 minutes of injected. Duration: It will depend on the type of local anesthetic used. On the average, 1 to 8 hours.  From steroids (Applies only if steroids were used): Decrease in swelling or inflammation. Once inflammation is improved, relief of the pain will follow. Onset of benefits: Depends on the amount of swelling present. The more swelling, the longer it will take for the benefits to be seen. In some cases, up to 10 days. Duration: Steroids will stay in the system x 2 weeks. Duration of benefits will depend on multiple posibilities including persistent irritating  factors. Side-effects: If present, they may typically last 2 weeks (the duration of the steroids). Frequent: Cramps (if they occur, drink Gatorade and take over-the-counter Magnesium 450-500 mg once to twice a day); water retention with temporary weight  gain; increases in blood sugar; decreased immune system response; increased appetite. Occasional: Facial flushing (red, warm cheeks); mood swings; menstrual changes. Uncommon: Long-term decrease or suppression of natural hormones; bone thinning. (These are more common with higher doses or more frequent use. This is why we prefer that our patients avoid having any injection therapies in other practices.)  Very Rare: Severe mood changes; psychosis; aseptic necrosis. From procedure: Some discomfort is to be expected once the numbing medicine wears off. This should be minimal if ice and heat are applied as instructed.  CALL IF... (When should I call?) You experience numbness and weakness that gets worse with time, as opposed to wearing off. New onset bowel or bladder incontinence. (Applies only to procedures done in the spine)  Emergency Numbers: Durning business hours (Monday - Thursday, 8:00 AM - 4:00 PM) (Friday, 9:00 AM - 12:00 Noon): [phone] After hours: [phone] NOTE: If you are having a problem and are unable connect with, or to talk to a provider, then go to your nearest urgent care or emergency department. If the problem is serious and urgent, please call 911. ______________________________________________________________________     ______________________________________________________________________    Steroid injections  Common steroids for injections Triamcinolone : Used by many sports medicine physicians for large joint and bursal injections, often combined with a local anesthetic like lidocaine . A study focusing on coccydynia (tailbone pain) found triamcinolone  was more effective than betamethasone, suggesting it may also be preferable for other localized inflammation conditions. Methylprednisolone: A common alternative to triamcinolone  that is also a strong anti-inflammatory. It is available in different formulations, with the acetate suspension being the long-acting  option for intra-articular injections. Dexamethasone : This is a non-particulate steroid, meaning it has a lower risk of tissue damage compared to particulate steroids like triamcinolone  and methylprednisolone. While less common for this specific use, it is an option for targeted injections.   Considerations for physicians Particulate vs. non-particulate steroids: Triamcinolone  and methylprednisolone are particulate, meaning they can clump together. Dexamethasone  is non-particulate. Particulate steroids are often preferred for their longer-lasting effects but carry a theoretical higher risk for certain injections (though this is less of a concern in the costochondral joints). Combined injectate: Corticosteroids are typically mixed with a local anesthetic like lidocaine  to provide both immediate pain relief (from the anesthetic) and longer-term inflammation reduction (from the steroid). Imaging guidance: To ensure accurate placement of the needle and medication, physicians may use ultrasound or fluoroscopic guidance for the injection, especially in complex or refractory cases.   Patient guidance Before undergoing a steroid injection, discuss the options with your physician. They will determine the best steroid, dosage, and procedure for your specific case based on factors like: Severity of your condition History of response to other treatments Your overall health status Experience and preference of the physician  Last  Updated: 01/11/2024 ______________________________________________________________________

## 2024-03-30 NOTE — Progress Notes (Signed)
 PROVIDER NOTE: Interpretation of information contained herein should be left to medically-trained personnel. Specific patient instructions are provided elsewhere under Patient Instructions section of medical record. This document was created in part using STT-dictation technology, any transcriptional errors that may result from this process are unintentional.  Patient: John Huang Type: Established DOB: 1976/04/08 MRN: [id number] PCP: Vicci Duwaine SQUIBB, DO  Service: Procedure DOS: 03/30/2024 Setting: Ambulatory Location: Ambulatory outpatient facility Delivery: Face-to-face Provider: Eric DELENA Como, MD Specialty: Interventional Pain Management Specialty designation: 09 Location: Outpatient facility Ref. Prov.: Vicci Duwaine P, DO       Interventional Therapy   Type: Caudal Epidural Steroid Injection   #1  Laterality: Midline aiming right Level: Sacrococcygeal ligament Target: Lumbosacral epidural canal  Location: Epidural space  Region: Caudal canal Approach: Percutaneous  Type of procedure: Epidural block Imaging: Fluoroscopy-guided Spinal ([id number]) Anesthesia: Local anesthesia (1-2% Lidocaine ) Anxiolysis: IV Versed   2.0 mg           Sedation: No Sedation                       DOS: 03/30/2024  Performed by: Eric DELENA Como, MD  Purpose: Diagnostic/Therapeutic Indications: Low back and lower extremity pain severe enough to impact quality of life or function. Rationale (medical necessity): procedure needed and proper for the diagnosis and/or treatment of Mr. Aldredge medical symptoms and needs. 1. Coccygodynia   2. Tailbone injury, sequela   3. Traumatic coccydynia   4. Sacral back pain   5. Radiculopathy, sacral and sacrococcygeal region   6. Chronic pain after traumatic injury   7. Abnormal MRI, lumbar spine (02/29/2020)    NAS-11 Pain score:   Pre-procedure: 2 /10   Post-procedure: 0-No pain/10         Note: Images reviewed, but official diagnostic  reading deferred to Radiologist.  Position  Prep  Materials:  Position: Prone  Prep solution: ChloraPrep (2% chlorhexidine gluconate and 70% isopropyl alcohol) Prep Area: Entire posterior lumbosacral area  Materials:  Tray: Epidural Needle(s):  Type: Epidural  Gauge (G): 17  Length: 3.5-in  Qty: 1  H&P (Pre-op Assessment):  Mr. Radigan is a 48 y.o. (year old), male patient, seen today for interventional treatment. He  has a past surgical history that includes Anterior cruciate ligament repair; Medial collateral ligament and lateral collateral ligament repair, knee; Shoulder surgery; Wisdom tooth extraction; Tonsillectomy (2020); Foot surgery (2020); Elbow surgery (Left, 06/20/2020); Colonoscopy with propofol  (N/A, 08/16/2020); Esophagogastroduodenoscopy (egd) with propofol  (N/A, 08/16/2020); and Colon surgery (03/09/2022). Mr. Bergin has a current medication list which includes the following prescription(s): amlodipine , cromolyn , cyclobenzaprine , duloxetine , fexofenadine , fluconazole , meloxicam , multi-vitamins, OVER THE COUNTER MEDICATION, OVER THE COUNTER MEDICATION, OVER THE COUNTER MEDICATION, OVER THE COUNTER MEDICATION, and pregabalin . His primarily concern today is the Back Pain (coccyx)  Initial Vital Signs:  Pulse/HCG Rate: 75ECG Heart Rate: 70 Temp: 97.7 F (36.5 C) Resp: 16 BP: (!) 144/95 SpO2: 98 %  BMI: Estimated body mass index is 29.53 kg/m as calculated from the following:   Height as of 03/29/24: 6' 2 (1.88 m).   Weight as of 03/29/24: 230 lb (104.3 kg).  Risk Assessment: Allergies: Reviewed. He is allergic to cat dander, other, alpha-gal, and galactose.  Allergy Precautions: None required Coagulopathies: Reviewed. None identified.  Blood-thinner therapy: None at this time Active Infection(s): Reviewed. None identified. Mr. Pozzi is afebrile  Site Confirmation: Mr. Coolman was asked to confirm the procedure and laterality before marking the site Procedure  checklist: Completed Consent:  Before the procedure and under the influence of no sedative(s), amnesic(s), or anxiolytics, the patient was informed of the treatment options, risks and possible complications. To fulfill our ethical and legal obligations, as recommended by the American Medical Association's Code of Ethics, I have informed the patient of my clinical impression; the nature and purpose of the treatment or procedure; the risks, benefits, and possible complications of the intervention; the alternatives, including doing nothing; the risk(s) and benefit(s) of the alternative treatment(s) or procedure(s); and the risk(s) and benefit(s) of doing nothing. The patient was provided information about the general risks and possible complications associated with the procedure. These may include, but are not limited to: failure to achieve desired goals, infection, bleeding, organ or nerve damage, allergic reactions, paralysis, and death. In addition, the patient was informed of those risks and complications associated to Spine-related procedures, such as failure to decrease pain; infection (i.e.: Meningitis, epidural or intraspinal abscess); bleeding (i.e.: epidural hematoma, subarachnoid hemorrhage, or any other type of intraspinal or peri-dural bleeding); organ or nerve damage (i.e.: Any type of peripheral nerve, nerve root, or spinal cord injury) with subsequent damage to sensory, motor, and/or autonomic systems, resulting in permanent pain, numbness, and/or weakness of one or several areas of the body; allergic reactions; (i.e.: anaphylactic reaction); and/or death. Furthermore, the patient was informed of those risks and complications associated with the medications. These include, but are not limited to: allergic reactions (i.e.: anaphylactic or anaphylactoid reaction(s)); adrenal axis suppression; blood sugar elevation that in diabetics may result in ketoacidosis or comma; water  retention that in patients  with history of congestive heart failure may result in shortness of breath, pulmonary edema, and decompensation with resultant heart failure; weight gain; swelling or edema; medication-induced neural toxicity; particulate matter embolism and blood vessel occlusion with resultant organ, and/or nervous system infarction; and/or aseptic necrosis of one or more joints. Finally, the patient was informed that Medicine is not an exact science; therefore, there is also the possibility of unforeseen or unpredictable risks and/or possible complications that may result in a catastrophic outcome. The patient indicated having understood very clearly. We have given the patient no guarantees and we have made no promises. Enough time was given to the patient to ask questions, all of which were answered to the patient's satisfaction. Mr. Marchitto has indicated that he wanted to continue with the procedure. Attestation: I, the ordering provider, attest that I have discussed with the patient the benefits, risks, side-effects, alternatives, likelihood of achieving goals, and potential problems during recovery for the procedure that I have provided informed consent. Date  Time: 03/30/2024  8:46 AM  Imaging Guidance (Spinal):          Type of Imaging Technique: Fluoroscopy Guidance (Spinal) Indication(s): Fluoroscopy guidance for needle placement to enhance accuracy in procedures requiring precise needle localization for targeted delivery of medication in or near specific anatomical locations not easily accessible without such real-time imaging assistance. Exposure Time: Please see nurses notes. Contrast: Before injecting any contrast, we confirmed that the patient did not have an allergy to iodine, shellfish, or radiological contrast. Once satisfactory needle placement was completed at the desired level, radiological contrast was injected. Contrast injected under live fluoroscopy. No contrast complications. See chart for type  and volume of contrast used. Fluoroscopic Guidance: I was personally present during the use of fluoroscopy. Tunnel Vision Technique used to obtain the best possible view of the target area. Parallax error corrected before commencing the procedure. Direction-depth-direction technique used to introduce the needle  under continuous pulsed fluoroscopy. Once target was reached, antero-posterior, oblique, and lateral fluoroscopic projection used confirm needle placement in all planes. Images permanently stored in EMR. Interpretation: I personally interpreted the imaging intraoperatively. Adequate needle placement confirmed in multiple planes. Appropriate spread of contrast into desired area was observed. No evidence of afferent or efferent intravascular uptake. No intrathecal or subarachnoid spread observed. Permanent images saved into the patient's record.  Pre-Procedure Preparation:  Monitoring: As per clinic protocol. Respiration, ETCO2, SpO2, BP, heart rate and rhythm monitor placed and checked for adequate function Safety Precautions: Patient was assessed for positional comfort and pressure points before starting the procedure. Time-out: I initiated and conducted the Time-out before starting the procedure, as per protocol. The patient was asked to participate by confirming the accuracy of the Time Out information. Verification of the correct person, site, and procedure were performed and confirmed by me, the nursing staff, and the patient. Time-out conducted as per Joint Commission's Universal Protocol (UP.01.01.01). Time:   Start Time:   hrs.  Description  Narrative of Procedure:          Start Time:   hrs.  Technical description of procedure:  Safety Precautions: Aspiration looking for blood return was conducted prior to all injections. At no point did we inject any substances, as a needle was being advanced. No attempts were made at seeking any paresthesias. Safe injection practices and  needle disposal techniques used. Medications properly checked for expiration dates. SDV (single dose vial) medications used. Description of the Procedure: Protocol guidelines were followed. The patient was placed in position over the fluoroscopy table. The target area was identified and the area prepped in the usual manner. Skin & deeper tissues infiltrated with local anesthetic. Appropriate amount of time allowed to pass for local anesthetics to take effect. The procedure needle was then advanced to the target area. Proper needle placement secured. Negative aspiration confirmed.  Solution injected in intermittent fashion, asking for systemic symptoms every 0.5cc of injectate. The needle/catheter were removed and the area cleansed, making sure to leave some of the prepping solution back to take advantage of its long term bactericidal properties.  Vitals:   03/30/24 0915 03/30/24 0920 03/30/24 0925 03/30/24 0935  BP: (!) 135/104 (!) 142/109 (!) 136/101 (!) 141/89  Pulse:      Resp: 16 15 18 16   Temp:      TempSrc:      SpO2: 100% 100% 100% 100%     End Time:   hrs.  Post-operative Assessment:  Post-procedure Vital Signs:  Pulse/HCG Rate: 7566 Temp: 97.7 F (36.5 C) Resp: 16 BP: (!) 141/89 SpO2: 100 %  EBL: None  Complications: No immediate post-treatment complications observed by team, or reported by patient.  Note: The patient tolerated the entire procedure well. A repeat set of vitals were taken after the procedure and the patient was kept under observation following institutional policy, for this type of procedure. Post-procedural neurological assessment was performed, showing return to baseline, prior to discharge. The patient was provided with post-procedure discharge instructions, including a section on how to identify potential problems. Should any problems arise concerning this procedure, the patient was given instructions to immediately contact us , at any time, without hesitation.  In any case, we plan to contact the patient by telephone for a follow-up status report regarding this interventional procedure.  Comments:  No additional relevant information.  Plan of Care (POC)  Orders:  Orders Placed This Encounter  Procedures   Caudal Epidural Injection  Scheduling Instructions:     Laterality: Midline     Level(s): Sacrococcygeal canal (Tailbone area)     Sedation: Patient's choice     Date: 03/30/2024    Where will this procedure be performed?:   [facility]   [facility] C-ARM 1-60 MIN NO REPORT    Intraoperative interpretation by procedural physician at [facility].    Standing Status:   Standing    Number of Occurrences:   1    Reason for exam::   Assistance in needle guidance and placement for procedures requiring needle placement in or near specific anatomical locations not easily accessible without such assistance.   Informed Consent Details: Physician/Practitioner Attestation; Transcribe to consent form and obtain patient signature    Nursing Order: Transcribe to consent form and obtain patient signature. Note: Always confirm laterality of pain with Mr. Walck, before procedure.    Physician/Practitioner attestation of informed consent for procedure/surgical case:   I, the physician/practitioner, attest that I have discussed with the patient the benefits, risks, side effects, alternatives, likelihood of achieving goals and potential problems during recovery for the procedure that I have provided informed consent.    Procedure:   Caudal epidural steroid injection    Physician/Practitioner performing the procedure:   Attilio Zeitler A. Tanya, MD    Indication/Reason:   Low back pain and lower extremity pain secondary to lumbosacral radiculitis   Provide equipment / supplies at bedside    Procedural tray: Epidural Tray (Disposable  single use) Skin infiltration needle: Regular 1.5-in, 25-G, (x1) Block needle size: Regular  standard Catheter: No catheter required    Standing Status:   Standing    Number of Occurrences:   1    Specify:   Epidural Tray   Saline lock IV    Have LR [phone] mL available and administer at 125 mL/hr if patient becomes hypotensive.    Standing Status:   Standing    Number of Occurrences:   1     Opioid Analgesic: None MME/day: 0 mg/day    Medications ordered for procedure: Meds ordered this encounter  Medications   iohexol  (OMNIPAQUE ) 180 MG/ML injection 10 mL    Must be Myelogram-compatible. If not available, you may substitute with a water -soluble, non-ionic, hypoallergenic, myelogram-compatible radiological contrast medium.   lidocaine  (XYLOCAINE ) 2 % (with pres) injection 400 mg   pentafluoroprop-tetrafluoroeth (GEBAUERS) aerosol   midazolam  PF (VERSED ) injection 0.5-2 mg    Make sure Flumazenil is available in the pyxis when using this medication. If oversedation occurs, administer 0.2 mg IV over 15 sec. If after 45 sec no response, administer 0.2 mg again over 1 min; may repeat at 1 min intervals; not to exceed 4 doses (1 mg)   sodium chloride  flush (NS) 0.9 % injection 2 mL   ropivacaine  (PF) 2 mg/mL (0.2%) (NAROPIN ) injection 2 mL   triamcinolone  acetonide (KENALOG -40) injection 40 mg   Medications administered: We administered iohexol , lidocaine , pentafluoroprop-tetrafluoroeth, midazolam  PF, sodium chloride  flush, ropivacaine  (PF) 2 mg/mL (0.2%), and triamcinolone  acetonide.  See the medical record for exact dosing, route, and time of administration.    Interventional Therapies  Risk Factors  Considerations  Medical Comorbidities:     Planned  Pending:      Under consideration:   Pending   Completed: (Analgesic benefit)1  None at this time   Therapeutic  Palliative (PRN) options:   None established   Completed by other providers:   None reported  1(Analgesic benefit): Expressed in  percentage (%). (Local anesthetic[LA] +/- sedation  L.A.Local  Anesthetic  Steroid benefit  Ongoing benefit)    Follow-up plan:   Return in about 2 weeks (around 04/13/2024) for (Face2F), (PPE).     Recent Visits Date Type Provider Dept  03/29/24 Office Visit Tanya Glisson, MD [facility]  Showing recent visits within past 90 days and meeting all other requirements Today's Visits Date Type Provider Dept  03/30/24 Procedure visit Tanya Glisson, MD [facility]  Showing today's visits and meeting all other requirements Future Appointments No visits were found meeting these conditions. Showing future appointments within next 90 days and meeting all other requirements   Disposition: Discharge home  Discharge (Date  Time): 03/30/2024; 0939 hrs.   Primary Care Physician: Vicci Duwaine SQUIBB, DO Location: [facility] Note by: Glisson DELENA Tanya, MD (TTS technology used. I apologize for any typographical errors that were not detected and corrected.) Date: 03/30/2024; Time: 1:41 PM  Disclaimer:  Medicine is not an visual merchandiser. The only guarantee in medicine is that nothing is guaranteed. It is important to note that the decision to proceed with this intervention was based on the information collected from the patient. The Data and conclusions were drawn from the patient's questionnaire, the interview, and the physical examination. Because the information was provided in large part by the patient, it cannot be guaranteed that it has not been purposely or unconsciously manipulated. Every effort has been made to obtain as much relevant data as possible for this evaluation. It is important to note that the conclusions that lead to this procedure are derived in large part from the available data. Always take into account that the treatment will also be dependent on availability of resources and existing treatment guidelines, considered by other Pain Management Practitioners as being common knowledge and  practice, at the time of the intervention. For Medico-Legal purposes, it is also important to point out that variation in procedural techniques and pharmacological choices are the acceptable norm. The indications, contraindications, technique, and results of the above procedure should only be interpreted and judged by a Board-Certified Interventional Pain Specialist with extensive familiarity and expertise in the same exact procedure and technique.

## 2024-03-31 ENCOUNTER — Telehealth: Payer: Self-pay

## 2024-03-31 NOTE — Telephone Encounter (Signed)
 Called PP, denies any problems at this time   instructed to call if needed.

## 2024-04-19 ENCOUNTER — Encounter: Payer: Self-pay | Admitting: Family Medicine

## 2024-04-20 ENCOUNTER — Other Ambulatory Visit: Payer: Self-pay | Admitting: Family Medicine

## 2024-04-20 DIAGNOSIS — S3992XA Unspecified injury of lower back, initial encounter: Secondary | ICD-10-CM

## 2024-04-20 MED ORDER — PREGABALIN 150 MG PO CAPS: 150.0000 mg | ORAL_CAPSULE | Freq: Three times a day (TID) | ORAL | 1 refills | Status: AC

## 2024-04-20 MED ORDER — CYCLOBENZAPRINE HCL 5 MG PO TABS: 5.0000 mg | ORAL_TABLET | Freq: Three times a day (TID) | ORAL | 0 refills | Status: AC | PRN

## 2024-04-20 MED ORDER — FEXOFENADINE HCL 180 MG PO TABS: 180.0000 mg | ORAL_TABLET | Freq: Every day | ORAL | 3 refills | Status: AC

## 2024-04-20 MED ORDER — AMLODIPINE BESYLATE 5 MG PO TABS: 5.0000 mg | ORAL_TABLET | Freq: Every day | ORAL | 1 refills | Status: AC

## 2024-04-20 MED ORDER — MELOXICAM 15 MG PO TABS: 15.0000 mg | ORAL_TABLET | Freq: Every day | ORAL | 0 refills | Status: AC

## 2024-04-20 MED ORDER — DULOXETINE HCL 60 MG PO CPEP: 60.0000 mg | ORAL_CAPSULE | Freq: Every day | ORAL | 1 refills | Status: AC

## 2024-04-28 ENCOUNTER — Ambulatory Visit (INDEPENDENT_AMBULATORY_CARE_PROVIDER_SITE_OTHER)

## 2024-04-28 ENCOUNTER — Encounter: Admitting: Family Medicine

## 2024-04-28 ENCOUNTER — Telehealth: Payer: Self-pay | Admitting: Family Medicine

## 2024-04-28 DIAGNOSIS — Z23 Encounter for immunization: Secondary | ICD-10-CM | POA: Diagnosis not present

## 2024-04-28 NOTE — Progress Notes (Signed)
 Patient is in office today for a nurse visit for Immunization. Patient Injection was given in the  Left deltoid. Patient tolerated injection well.

## 2024-04-28 NOTE — Telephone Encounter (Signed)
 He was scheduled for 10am today, but missed.

## 2024-04-28 NOTE — Telephone Encounter (Signed)
 Nurse visit completed this afternoon.

## 2024-04-28 NOTE — Telephone Encounter (Signed)
 Copied from CRM #[id number]. Topic: Appointments - Scheduling Inquiry for Clinic >> Apr 28, 2024 10:05 AM Mercedes MATSU wrote: Reason for CRM: Patient asked if he can be squeezed on the schedule for today hor a hepatitis injection be cause [place]. Pt can be reached at [phone]. He declined appt for Monday that was offered, per the kms an office visit is needed was unable to assist further.
